# Patient Record
Sex: Male | Born: 1955 | Race: White | Hispanic: No | Marital: Single | Smoking: Never smoker
Health system: Southern US, Community
[De-identification: ages and names within clinical notes are randomized; demographics above are authoritative.]

## PROBLEM LIST (undated history)

## (undated) DIAGNOSIS — M1990 Unspecified osteoarthritis, unspecified site: Secondary | ICD-10-CM

## (undated) DIAGNOSIS — I10 Essential (primary) hypertension: Secondary | ICD-10-CM

## (undated) DIAGNOSIS — E669 Obesity, unspecified: Secondary | ICD-10-CM

## (undated) DIAGNOSIS — E785 Hyperlipidemia, unspecified: Secondary | ICD-10-CM

## (undated) DIAGNOSIS — Z8619 Personal history of other infectious and parasitic diseases: Secondary | ICD-10-CM

## (undated) DIAGNOSIS — F419 Anxiety disorder, unspecified: Secondary | ICD-10-CM

## (undated) DIAGNOSIS — J45909 Unspecified asthma, uncomplicated: Secondary | ICD-10-CM

## (undated) DIAGNOSIS — R739 Hyperglycemia, unspecified: Secondary | ICD-10-CM

## (undated) HISTORY — DX: Hyperglycemia, unspecified: R73.9

## (undated) HISTORY — DX: Hyperlipidemia, unspecified: E78.5

## (undated) HISTORY — DX: Personal history of other infectious and parasitic diseases: Z86.19

## (undated) HISTORY — DX: Unspecified asthma, uncomplicated: J45.909

## (undated) HISTORY — DX: Obesity, unspecified: E66.9

## (undated) HISTORY — DX: Anxiety disorder, unspecified: F41.9

## (undated) HISTORY — DX: Unspecified osteoarthritis, unspecified site: M19.90

## (undated) HISTORY — DX: Essential (primary) hypertension: I10

---
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
## 1998-10-25 DIAGNOSIS — F419 Anxiety disorder, unspecified: Secondary | ICD-10-CM | POA: Insufficient documentation

## 2008-02-13 DIAGNOSIS — I10 Essential (primary) hypertension: Secondary | ICD-10-CM | POA: Insufficient documentation

## 2008-09-05 DIAGNOSIS — E782 Mixed hyperlipidemia: Secondary | ICD-10-CM | POA: Insufficient documentation

## 2013-10-29 ENCOUNTER — Ambulatory Visit: Payer: Self-pay | Admitting: Specialist

## 2014-01-03 ENCOUNTER — Ambulatory Visit: Payer: Self-pay | Admitting: Physician Assistant

## 2014-03-26 DIAGNOSIS — J45909 Unspecified asthma, uncomplicated: Secondary | ICD-10-CM | POA: Insufficient documentation

## 2014-04-25 DIAGNOSIS — L03116 Cellulitis of left lower limb: Secondary | ICD-10-CM | POA: Insufficient documentation

## 2014-04-25 DIAGNOSIS — L03119 Cellulitis of unspecified part of limb: Secondary | ICD-10-CM | POA: Insufficient documentation

## 2014-04-30 DIAGNOSIS — R7303 Prediabetes: Secondary | ICD-10-CM | POA: Insufficient documentation

## 2014-04-30 DIAGNOSIS — R739 Hyperglycemia, unspecified: Secondary | ICD-10-CM | POA: Insufficient documentation

## 2014-04-30 LAB — LIPID PANEL
Cholesterol: 155 mg/dL (ref 0–200)
HDL: 43 mg/dL (ref 35–70)
LDL CALC: 73 mg/dL
TRIGLYCERIDES: 197 mg/dL — AB (ref 40–160)

## 2014-04-30 LAB — BASIC METABOLIC PANEL
BUN: 9 mg/dL (ref 4–21)
Creatinine: 1.1 mg/dL (ref 0.6–1.3)
GLUCOSE: 113 mg/dL
Potassium: 3.4 mmol/L (ref 3.4–5.3)
SODIUM: 140 mmol/L (ref 137–147)

## 2014-04-30 LAB — TSH: TSH: 1.54 u[IU]/mL (ref 0.41–5.90)

## 2014-04-30 LAB — HEPATIC FUNCTION PANEL: ALT: 25 U/L (ref 10–40)

## 2015-02-06 DIAGNOSIS — R600 Localized edema: Secondary | ICD-10-CM | POA: Insufficient documentation

## 2015-02-06 DIAGNOSIS — I872 Venous insufficiency (chronic) (peripheral): Secondary | ICD-10-CM | POA: Insufficient documentation

## 2015-06-06 ENCOUNTER — Other Ambulatory Visit: Payer: Self-pay | Admitting: Family Medicine

## 2015-06-08 NOTE — Telephone Encounter (Signed)
Please call in the following medication.  [id number] [phone]   Surescripts Out Interface  2 days ago   (9:44 AM)       Requested Medications     Name from pharmacy:  In chart as:  ALPRAZOLAM 2 MG TABLET alprazolam (XANAX) 2 MG tablet    Sig: take 1 tablet by mouth four times a day if needed    Dispense: 120 tablet (Pharmacy requested 120)   Refills: 2

## 2015-06-09 NOTE — Telephone Encounter (Signed)
Rx called in to pharmacy. 

## 2015-06-12 ENCOUNTER — Other Ambulatory Visit: Payer: Self-pay | Admitting: Family Medicine

## 2015-08-23 ENCOUNTER — Other Ambulatory Visit: Payer: Self-pay | Admitting: Family Medicine

## 2015-08-23 NOTE — Telephone Encounter (Signed)
Please call in alprazolam.  

## 2015-08-25 NOTE — Telephone Encounter (Signed)
Rx called in to pharmacy. 

## 2015-11-22 ENCOUNTER — Other Ambulatory Visit: Payer: Self-pay | Admitting: Family Medicine

## 2015-11-23 NOTE — Telephone Encounter (Signed)
Please call in alprazolam.  

## 2015-11-24 NOTE — Telephone Encounter (Signed)
Rx called in to pharmacy. 

## 2015-11-24 NOTE — Telephone Encounter (Signed)
Left message with pt's wife that he needs a f/u appt.

## 2015-12-18 ENCOUNTER — Other Ambulatory Visit: Payer: Self-pay | Admitting: Family Medicine

## 2016-02-13 ENCOUNTER — Other Ambulatory Visit: Payer: Self-pay | Admitting: Family Medicine

## 2016-02-13 NOTE — Telephone Encounter (Signed)
Please call in alprazolam.  

## 2016-02-13 NOTE — Telephone Encounter (Signed)
Rx called in to pharmacy. 

## 2016-02-16 DIAGNOSIS — E669 Obesity, unspecified: Secondary | ICD-10-CM | POA: Insufficient documentation

## 2016-02-17 ENCOUNTER — Ambulatory Visit (INDEPENDENT_AMBULATORY_CARE_PROVIDER_SITE_OTHER): Payer: BLUE CROSS/BLUE SHIELD | Admitting: Family Medicine

## 2016-02-17 ENCOUNTER — Encounter: Payer: Self-pay | Admitting: Family Medicine

## 2016-02-17 VITALS — BP 118/70 | HR 103 | Temp 98.7°F | Resp 18 | Ht 68.0 in | Wt 271.0 lb

## 2016-02-17 DIAGNOSIS — E782 Mixed hyperlipidemia: Secondary | ICD-10-CM

## 2016-02-17 DIAGNOSIS — Z23 Encounter for immunization: Secondary | ICD-10-CM | POA: Diagnosis not present

## 2016-02-17 DIAGNOSIS — Z0000 Encounter for general adult medical examination without abnormal findings: Secondary | ICD-10-CM | POA: Diagnosis not present

## 2016-02-17 DIAGNOSIS — Z1159 Encounter for screening for other viral diseases: Secondary | ICD-10-CM | POA: Diagnosis not present

## 2016-02-17 DIAGNOSIS — I10 Essential (primary) hypertension: Secondary | ICD-10-CM | POA: Diagnosis not present

## 2016-02-17 DIAGNOSIS — R739 Hyperglycemia, unspecified: Secondary | ICD-10-CM

## 2016-02-17 NOTE — Patient Instructions (Addendum)
It is recommended to engage in 150 minutes of moderate intensity exercise every week.

## 2016-02-17 NOTE — Progress Notes (Signed)
Patient: Clarence Hamilton, Male    DOB: 24-Apr-1956, 60 y.o.   MRN: [id number] Visit Date: 02/17/2016  Today's Provider: Mila Merry, MD   Chief Complaint  Patient presents with  . Annual Exam  . Hypertension  . Hyperlipidemia  . Asthma  . Anxiety  . Hyperglycemia   Subjective:    Annual physical exam Clarence Hamilton is a 60 y.o. male who presents today for health maintenance and complete physical. He feels well. He reports exercising yes. He reports he is sleeping poorly.  -----------------------------------------------------------------  Follow-up for asthma from 04/29/2014; no changes, followed by Dr. Mayo Ao. Doing well on current inhalers.    Follow-up for anxiety from 04/29/2014; no changes, continue alprazolam and buspirone which he states continues to work very well     Hypertension, follow-up:  BP Readings from Last 3 Encounters:  02/17/16 118/70  04/29/14 140/80    He was last seen for hypertension 04/29/2014.  BP at that visit was 140/80. Management since that visit includes; no changes.He reports good compliance with treatment. He is not having side effects. none  He is exercising. He is adherent to low salt diet.   Outside blood pressures are n/a. He is experiencing none.  Patient denies none.   Cardiovascular risk factors include none.  Use of agents associated with hypertension: none.   ----------------------------------------------------------------------    Lipid/Cholesterol, Follow-up:   Last seen for this 11/29/2014.  Management since that visit includes; changed sincor to simvastatin due to changes in formulary.  Last Lipid Panel:    Component Value Date/Time   CHOL 155 04/30/2014   TRIG 197* 04/30/2014   HDL 43 04/30/2014   LDLCALC 73 04/30/2014    He reports good compliance with treatment. He is not having side effects. none  Wt Readings from Last 3 Encounters:  02/17/16 271 lb (122.925 kg)  04/29/14 234 lb (106.142  kg)    ----------------------------------------------------------------------     Review of Systems  Constitutional: Positive for activity change and unexpected weight change.  HENT: Positive for congestion, postnasal drip, sinus pressure and sneezing.   Eyes: Positive for discharge and itching.  Respiratory: Positive for wheezing.   Cardiovascular: Negative.   Gastrointestinal: Negative.   Endocrine: Negative.   Genitourinary: Negative.   Musculoskeletal: Negative.   Skin: Negative.   Allergic/Immunologic: Positive for environmental allergies.  Neurological: Negative.   Hematological: Negative.   Psychiatric/Behavioral: Positive for sleep disturbance.    Social History      He  reports that he has never smoked. He does not have any smokeless tobacco history on file. He reports that he does not drink alcohol or use illicit drugs.       Social History   Social History  . Marital Status: Single    Spouse Name: N/A  . Number of Children: 0  . Years of Education: N/A   Occupational History  . Retired    Social History Main Topics  . Smoking status: Never Smoker   . Smokeless tobacco: None  . Alcohol Use: No  . Drug Use: No  . Sexual Activity: Not Asked   Other Topics Concern  . None   Social History Narrative    Past Medical History  Diagnosis Date  . History of chicken pox   . Hyperlipidemia   . Anxiety   . Hypertension   . Asthma   . Hyperglycemia   . Obesity      Patient Active Problem List   Diagnosis  Date Noted  . Obesity 02/16/2016  . Dermatitis, stasis 02/06/2015  . Edema leg 02/06/2015  . Hyperglycemia 04/30/2014  . Ankle cellulitis 04/25/2014  . Hyperlipidemia, mixed 09/05/2008  . Hypertension 02/13/2008  . Anxiety disorder 10/25/1998  . Asthma, intrinsic 10/25/1968    Past Surgical History  Procedure Laterality Date  . Hernia repair  1970's    Inguinal hernia repair x 2   . Tonsillectomy and adenoidectomy  1960's    Family  History        Family Status  Relation Status Death Age  . Mother Alive   . Father Alive         His family history includes Breast cancer in his mother; Hypertension in his father and mother; Melanoma in his father; Parkinson's disease in his father.    Allergies  Allergen Reactions  . Sulfa Antibiotics   . Tetracycline   . Corticosteroids     Other reaction(s): Muscle Pain    Previous Medications   ALPRAZOLAM (XANAX) 2 MG TABLET    take 1 tablet by mouth four times a day if needed   AZELASTINE-FLUTICASONE (DYMISTA) 137-50 MCG/ACT SUSP    Place 1 spray into both nostrils 2 (two) times daily.   BUSPIRONE (BUSPAR) 15 MG TABLET    TAKE 1/2 TABLET BY MOUTH FOUR TIMES A DAY   DILTIAZEM (CARDIZEM CD) 240 MG 24 HR CAPSULE    Take 1 capsule by mouth daily.   FLUTICASONE (FLONASE) 50 MCG/ACT NASAL SPRAY    Place 2 sprays into both nostrils every morning.   FUROSEMIDE (LASIX) 20 MG TABLET    Take 1 tablet by mouth daily.   LOSARTAN-HYDROCHLOROTHIAZIDE (HYZAAR) 100-25 MG TABLET    Take 1 tablet by mouth daily.   MONTELUKAST (SINGULAIR) 10 MG TABLET    Take 1 tablet by mouth daily.   MULTIPLE VITAMINS-MINERALS (CENTRUM SILVER PO)    Take 1 tablet by mouth daily.   NIACIN (NIASPAN) 1000 MG CR TABLET    take 1 tablet by mouth once daily   PROAIR HFA 108 (90 BASE) MCG/ACT INHALER       SIMVASTATIN (ZOCOR) 20 MG TABLET    take 1 tablet by mouth every evening   TRIAMCINOLONE OINTMENT (KENALOG) 0.5 %    Apply 1 application topically 2 (two) times daily.    Patient Care Team: Malva Limes, MD as PCP - General (Family Medicine) Mertie Moores, MD as Referring Physician (Specialist)     Objective:   Vitals: BP 118/70 mmHg  Pulse 103  Temp(Src) 98.7 F (37.1 C) (Oral)  Resp 18  Ht  (1.727 m)  Wt 271 lb (122.925 kg)  BMI 41.22 kg/m2  SpO2 94%   Physical Exam   General Appearance:    Alert, cooperative, no distress, appears stated age, morbidly ovese  Head:    Normocephalic,  without obvious abnormality, atraumatic  Eyes:    PERRL, conjunctiva/corneas clear, EOM's intact, fundi    benign, both eyes       Ears:    Normal TM's and external ear canals, both ears  Nose:   Nares normal, septum midline, mucosa normal, no drainage   or sinus tenderness  Throat:   Lips, mucosa, and tongue normal; teeth and gums normal  Neck:   Supple, symmetrical, trachea midline, no adenopathy;       thyroid:  No enlargement/tenderness/nodules; no carotid   bruit or JVD  Back:     Symmetric, no curvature, ROM normal, no CVA  tenderness  Lungs:     Clear to auscultation bilaterally, respirations unlabored  Chest wall:    No tenderness or deformity  Heart:    Regular rate and rhythm, S1 and S2 normal, no murmur, rub   or gallop  Abdomen:     Soft, non-tender, bowel sounds active all four quadrants,    no masses, no organomegaly  Genitalia:    deferred  Rectal:    deferred  Extremities:   Extremities normal, atraumatic, no cyanosis or edema  Pulses:   2+ and symmetric all extremities  Skin:   Skin color, texture, turgor normal, no rashes or lesions  Lymph nodes:   Cervical, supraclavicular, and axillary nodes normal  Neurologic:   CNII-XII intact. Normal strength, sensation and reflexes      throughout    Depression Screen PHQ 2/9 Scores 02/17/2016  PHQ - 2 Score 0  PHQ- 9 Score 3      Assessment & Plan:     Routine Health Maintenance and Physical Exam  Exercise Activities and Dietary recommendations Goals    None       There is no immunization history on file for this patient.  Health Maintenance  Topic Date Due  . Hepatitis C Screening  1956-01-02  . HIV Screening  07/01/1972  . TETANUS/TDAP  07/01/1976  . COLONOSCOPY  07/02/2007  . INFLUENZA VACCINE  05/25/2016      Discussed health benefits of physical activity, and encouraged him to engage in regular exercise appropriate for his age and condition.      --------------------------------------------------------------------   1. Annual physical exam  - Comprehensive metabolic panel - CBC - PSA  2. Hyperglycemia  - Hemoglobin A1c  3. Hyperlipidemia, mixed He is tolerating simvastatin well with no adverse effects.   - Lipid panel  4. Essential hypertension .Well controlled.  Continue current medications.   - TSH - EKG 12-Lead  5. Need for hepatitis C screening test  - Hepatitis C antibody  6. Morbid obesity, unspecified obesity type (HCC) He is planning on staring exercising more frequently and reducing calory intake.

## 2016-02-27 ENCOUNTER — Other Ambulatory Visit: Payer: Self-pay | Admitting: Family Medicine

## 2016-03-31 ENCOUNTER — Other Ambulatory Visit: Payer: Self-pay | Admitting: *Deleted

## 2016-03-31 DIAGNOSIS — Z1212 Encounter for screening for malignant neoplasm of rectum: Principal | ICD-10-CM

## 2016-03-31 DIAGNOSIS — Z1211 Encounter for screening for malignant neoplasm of colon: Secondary | ICD-10-CM

## 2016-03-31 LAB — IFOBT (OCCULT BLOOD): IFOBT: NEGATIVE

## 2016-04-01 ENCOUNTER — Encounter: Payer: Self-pay | Admitting: Family Medicine

## 2016-04-01 DIAGNOSIS — R972 Elevated prostate specific antigen [PSA]: Secondary | ICD-10-CM | POA: Insufficient documentation

## 2016-04-01 LAB — CBC
HEMOGLOBIN: 14.9 g/dL (ref 12.6–17.7)
Hematocrit: 44.5 % (ref 37.5–51.0)
MCH: 29.7 pg (ref 26.6–33.0)
MCHC: 33.5 g/dL (ref 31.5–35.7)
MCV: 89 fL (ref 79–97)
Platelets: 278 10*3/uL (ref 150–379)
RBC: 5.02 x10E6/uL (ref 4.14–5.80)
RDW: 14.7 % (ref 12.3–15.4)
WBC: 9.9 10*3/uL (ref 3.4–10.8)

## 2016-04-01 LAB — LIPID PANEL
Chol/HDL Ratio: 3.8 ratio units (ref 0.0–5.0)
Cholesterol, Total: 170 mg/dL (ref 100–199)
HDL: 45 mg/dL (ref >39)
LDL Calculated: 80 mg/dL (ref 0–99)
TRIGLYCERIDES: 224 mg/dL — AB (ref 0–149)
VLDL Cholesterol Cal: 45 mg/dL — ABNORMAL HIGH (ref 5–40)

## 2016-04-01 LAB — PSA: Prostate Specific Ag, Serum: 4.4 ng/mL — ABNORMAL HIGH (ref 0.0–4.0)

## 2016-04-01 LAB — COMPREHENSIVE METABOLIC PANEL
A/G RATIO: 1.6 (ref 1.2–2.2)
ALT: 31 IU/L (ref 0–44)
AST: 17 IU/L (ref 0–40)
Albumin: 4.3 g/dL (ref 3.5–5.5)
Alkaline Phosphatase: 107 IU/L (ref 39–117)
BUN/Creatinine Ratio: 13 (ref 9–20)
BUN: 13 mg/dL (ref 6–24)
Bilirubin Total: 0.4 mg/dL (ref 0.0–1.2)
CO2: 31 mmol/L — ABNORMAL HIGH (ref 18–29)
Calcium: 9.5 mg/dL (ref 8.7–10.2)
Chloride: 96 mmol/L (ref 96–106)
Creatinine, Ser: 1.04 mg/dL (ref 0.76–1.27)
GFR, EST AFRICAN AMERICAN: 91 mL/min/{1.73_m2} (ref >59)
GFR, EST NON AFRICAN AMERICAN: 79 mL/min/{1.73_m2} (ref >59)
GLUCOSE: 112 mg/dL — AB (ref 65–99)
Globulin, Total: 2.7 g/dL (ref 1.5–4.5)
POTASSIUM: 3.9 mmol/L (ref 3.5–5.2)
SODIUM: 142 mmol/L (ref 134–144)
Total Protein: 7 g/dL (ref 6.0–8.5)

## 2016-04-01 LAB — HEMOGLOBIN A1C
Est. average glucose Bld gHb Est-mCnc: 143 mg/dL
Hgb A1c MFr Bld: 6.6 % — ABNORMAL HIGH (ref 4.8–5.6)

## 2016-04-01 LAB — TSH: TSH: 1.49 u[IU]/mL (ref 0.450–4.500)

## 2016-04-01 LAB — HEPATITIS C ANTIBODY: Hep C Virus Ab: <0.1 s/co ratio (ref 0.0–0.9)

## 2016-04-09 ENCOUNTER — Other Ambulatory Visit: Payer: Self-pay | Admitting: Family Medicine

## 2016-05-07 ENCOUNTER — Other Ambulatory Visit: Payer: Self-pay | Admitting: Family Medicine

## 2016-05-07 DIAGNOSIS — F419 Anxiety disorder, unspecified: Secondary | ICD-10-CM

## 2016-05-07 NOTE — Telephone Encounter (Signed)
Please call in alprazolam.  

## 2016-07-11 ENCOUNTER — Telehealth: Payer: Self-pay | Admitting: Family Medicine

## 2016-07-11 DIAGNOSIS — R972 Elevated prostate specific antigen [PSA]: Secondary | ICD-10-CM

## 2016-07-11 DIAGNOSIS — R739 Hyperglycemia, unspecified: Secondary | ICD-10-CM

## 2016-07-11 NOTE — Telephone Encounter (Signed)
-----   Message from Malva Limesonald E , MD sent at 07/09/2016  8:05 AM EDT ----- Regarding: FW: be sure returns september for follow up labs and elevated PSA   ----- Message ----- From: Malva Limesonald E , MD Sent: 07/02/2016 To: Malva Limesonald E , MD Subject: be sure returns september for follow up labs#

## 2016-07-11 NOTE — Telephone Encounter (Signed)
Patient is due to check PSA due to elevated PSA in June and HgbA1c due to elevated blood sugar in June. Please advised patient and leave order for him to print at front desk. Thanks.

## 2016-07-12 NOTE — Telephone Encounter (Signed)
Patient notified and lab slip at front desk for pick-up.

## 2016-07-24 ENCOUNTER — Other Ambulatory Visit: Payer: Self-pay | Admitting: Family Medicine

## 2016-08-04 ENCOUNTER — Telehealth: Payer: Self-pay | Admitting: Family Medicine

## 2016-08-04 NOTE — Telephone Encounter (Signed)
-----   Message from Malva Limesonald E , MD sent at 07/09/2016  8:05 AM EDT ----- Regarding: FW: be sure returns september for follow up labs and elevated PSA   ----- Message ----- From: Malva Limesonald E , MD Sent: 07/02/2016 To: Malva Limesonald E , MD Subject: be sure returns september for follow up labs#

## 2016-08-04 NOTE — Telephone Encounter (Signed)
Please advise patient it is time to recheck PSA and A1C due to elevated levels when last checked in June. Please print lab order from 07-12-2016 for him to pick up. Thanks.

## 2016-08-26 NOTE — Telephone Encounter (Signed)
[id number] 

## 2016-08-27 NOTE — Telephone Encounter (Signed)
LMOVM for pt to return call 

## 2016-08-28 ENCOUNTER — Other Ambulatory Visit: Payer: Self-pay | Admitting: Family Medicine

## 2016-08-28 DIAGNOSIS — F419 Anxiety disorder, unspecified: Secondary | ICD-10-CM

## 2016-08-29 NOTE — Telephone Encounter (Signed)
Please call in alprazolam.  

## 2016-08-30 NOTE — Telephone Encounter (Signed)
Patient called back and stated that he will come get his labs drawn sometime this week.

## 2016-08-30 NOTE — Telephone Encounter (Signed)
Rx called in to pharmacy. 

## 2016-08-31 LAB — HEMOGLOBIN A1C
ESTIMATED AVERAGE GLUCOSE: 131 mg/dL
Hgb A1c MFr Bld: 6.2 % — ABNORMAL HIGH (ref 4.8–5.6)

## 2016-08-31 LAB — PSA: Prostate Specific Ag, Serum: 6.5 ng/mL — ABNORMAL HIGH (ref 0.0–4.0)

## 2016-09-02 ENCOUNTER — Telehealth: Payer: Self-pay

## 2016-09-02 DIAGNOSIS — R972 Elevated prostate specific antigen [PSA]: Secondary | ICD-10-CM

## 2016-09-02 NOTE — Telephone Encounter (Signed)
-----   Message from Malva Limesonald E Fisher, MD sent at 09/02/2016  8:26 AM EST ----- A1c is borderline for diabetes at 6.2 need to check every six months. PSA has gone up more to 6.5. Need referral to urology for further evaluation.

## 2016-09-02 NOTE — Telephone Encounter (Signed)
Pt advised.  [facility] refer to [facility].  Thanks,   -Vernona RiegerLaura

## 2016-09-13 ENCOUNTER — Ambulatory Visit: Payer: BLUE CROSS/BLUE SHIELD

## 2016-10-11 ENCOUNTER — Telehealth: Payer: Self-pay | Admitting: Family Medicine

## 2016-10-11 NOTE — Telephone Encounter (Signed)
Pt states that the only doctor in town that is in network with insurance is Dr Artis FlockWolfe.Appointment made for 10/20/16 at 2:45 (Pt requested to wait until end of Dec for appointment).

## 2016-10-11 NOTE — Telephone Encounter (Signed)
-----   Message from Malva Limesonald E , MD sent at 09/27/2016  8:13 AM EST ----- Regarding: FW: malke sures sees urology 11-20 for high psa Patient cancelled make sure reschedules before end of year.   ----- Message ----- From: Malva Limesonald E , MD Sent: 09/25/2016 To: Malva Limesonald E , MD Subject: Annell GreeningFWDartha Lodge: malke sures sees urology 11-20 for high #    ----- Message ----- From: Malva Limesonald E , MD Sent: 09/14/2016 To: Malva Limesonald E , MD Subject: Lenna Sciaramalke sures sees urology 11-20 for high psa

## 2016-11-09 NOTE — Telephone Encounter (Signed)
Patient stated that he has not seen the urologist yet. Patient stated that he has been seeing Dr. Mayo AoFlemming for breathing problems and he hasn't wanted to deal with seeing another doctor at this time. Patient stated that he will make an appt to se Dr. Artis FlockWolfe soon.

## 2016-11-09 NOTE — Telephone Encounter (Signed)
Please check with patient to see if every saw urologist about high PSA. We never received any records from urology.

## 2016-12-15 ENCOUNTER — Other Ambulatory Visit: Payer: Self-pay | Admitting: Family Medicine

## 2017-01-15 ENCOUNTER — Other Ambulatory Visit: Payer: Self-pay | Admitting: Family Medicine

## 2017-01-15 DIAGNOSIS — F419 Anxiety disorder, unspecified: Secondary | ICD-10-CM

## 2017-01-15 NOTE — Telephone Encounter (Signed)
Please call in alprazolam.  

## 2017-01-17 NOTE — Telephone Encounter (Signed)
Rx called in to pharmacy. 

## 2017-03-12 ENCOUNTER — Other Ambulatory Visit: Payer: Self-pay | Admitting: Family Medicine

## 2017-03-30 ENCOUNTER — Other Ambulatory Visit: Payer: Self-pay | Admitting: Specialist

## 2017-03-30 DIAGNOSIS — J849 Interstitial pulmonary disease, unspecified: Secondary | ICD-10-CM

## 2017-03-30 DIAGNOSIS — R0902 Hypoxemia: Secondary | ICD-10-CM

## 2017-03-30 DIAGNOSIS — R0602 Shortness of breath: Secondary | ICD-10-CM

## 2017-04-04 ENCOUNTER — Ambulatory Visit
Admission: RE | Admit: 2017-04-04 | Discharge: 2017-04-04 | Disposition: A | Discharge status: Home / Self Care | Payer: BLUE CROSS/BLUE SHIELD | Source: Ambulatory Visit | Attending: Specialist | Admitting: Specialist

## 2017-04-04 DIAGNOSIS — I2510 Atherosclerotic heart disease of native coronary artery without angina pectoris: Secondary | ICD-10-CM | POA: Diagnosis not present

## 2017-04-04 DIAGNOSIS — J398 Other specified diseases of upper respiratory tract: Secondary | ICD-10-CM | POA: Insufficient documentation

## 2017-04-04 DIAGNOSIS — R0602 Shortness of breath: Secondary | ICD-10-CM | POA: Diagnosis not present

## 2017-04-04 DIAGNOSIS — I700 Atherosclerosis of aorta: Secondary | ICD-10-CM | POA: Insufficient documentation

## 2017-04-04 DIAGNOSIS — J984 Other disorders of lung: Secondary | ICD-10-CM | POA: Diagnosis not present

## 2017-04-04 DIAGNOSIS — R918 Other nonspecific abnormal finding of lung field: Secondary | ICD-10-CM | POA: Insufficient documentation

## 2017-04-04 DIAGNOSIS — R0902 Hypoxemia: Secondary | ICD-10-CM | POA: Diagnosis not present

## 2017-04-04 DIAGNOSIS — J849 Interstitial pulmonary disease, unspecified: Secondary | ICD-10-CM

## 2017-04-05 ENCOUNTER — Other Ambulatory Visit: Payer: Self-pay | Admitting: Specialist

## 2017-04-05 DIAGNOSIS — R0602 Shortness of breath: Secondary | ICD-10-CM

## 2017-04-08 ENCOUNTER — Other Ambulatory Visit: Payer: Self-pay | Admitting: Family Medicine

## 2017-04-08 DIAGNOSIS — F419 Anxiety disorder, unspecified: Secondary | ICD-10-CM

## 2017-05-06 ENCOUNTER — Other Ambulatory Visit: Payer: Self-pay | Admitting: Family Medicine

## 2017-06-06 ENCOUNTER — Other Ambulatory Visit: Payer: Self-pay | Admitting: Family Medicine

## 2017-06-06 DIAGNOSIS — F419 Anxiety disorder, unspecified: Secondary | ICD-10-CM

## 2017-06-06 NOTE — Telephone Encounter (Signed)
Please call in alprazolam.  Patient needs to schedule office visit.

## 2017-06-06 NOTE — Telephone Encounter (Signed)
Please review. Thanks!  

## 2017-06-07 NOTE — Telephone Encounter (Signed)
Rx called in to pharmacy. 

## 2017-06-10 ENCOUNTER — Other Ambulatory Visit: Payer: Self-pay | Admitting: Family Medicine

## 2017-07-04 ENCOUNTER — Other Ambulatory Visit: Payer: Self-pay | Admitting: Family Medicine

## 2017-07-04 DIAGNOSIS — F419 Anxiety disorder, unspecified: Secondary | ICD-10-CM

## 2017-07-31 ENCOUNTER — Other Ambulatory Visit: Payer: Self-pay | Admitting: Family Medicine

## 2017-07-31 DIAGNOSIS — F419 Anxiety disorder, unspecified: Secondary | ICD-10-CM

## 2017-08-01 NOTE — Telephone Encounter (Signed)
Rx called in to pharmacy. 

## 2017-08-01 NOTE — Telephone Encounter (Signed)
Please call in alprazolam.  

## 2017-09-13 ENCOUNTER — Telehealth: Payer: Self-pay | Admitting: Family Medicine

## 2017-09-13 NOTE — Telephone Encounter (Signed)
Pt wanted to let Dr. Sherrie MustacheFisher know that Dr. Meredeth IdeFleming has already started the process for pt to get disability due to COPD, shortness of breath, & anxiety. Thanks TNP

## 2017-09-26 ENCOUNTER — Other Ambulatory Visit: Payer: Self-pay | Admitting: Family Medicine

## 2017-09-26 NOTE — Telephone Encounter (Signed)
Patient has not been in nearly 2 years, need to schedule apt before refill can be approved.

## 2017-09-27 NOTE — Telephone Encounter (Signed)
OV has been scheduled for 10/04/2017.

## 2017-10-04 ENCOUNTER — Ambulatory Visit: Payer: Self-pay | Admitting: Family Medicine

## 2017-10-07 ENCOUNTER — Ambulatory Visit: Payer: BLUE CROSS/BLUE SHIELD | Admitting: Family Medicine

## 2017-10-07 ENCOUNTER — Encounter: Payer: Self-pay | Admitting: Family Medicine

## 2017-10-07 VITALS — BP 110/68 | HR 111 | Temp 99.0°F | Resp 18 | Ht 68.0 in | Wt 254.0 lb

## 2017-10-07 DIAGNOSIS — M545 Low back pain: Secondary | ICD-10-CM

## 2017-10-07 DIAGNOSIS — E782 Mixed hyperlipidemia: Secondary | ICD-10-CM | POA: Diagnosis not present

## 2017-10-07 DIAGNOSIS — I10 Essential (primary) hypertension: Secondary | ICD-10-CM

## 2017-10-07 DIAGNOSIS — R739 Hyperglycemia, unspecified: Secondary | ICD-10-CM

## 2017-10-07 DIAGNOSIS — F419 Anxiety disorder, unspecified: Secondary | ICD-10-CM | POA: Diagnosis not present

## 2017-10-07 DIAGNOSIS — J40 Bronchitis, not specified as acute or chronic: Secondary | ICD-10-CM | POA: Diagnosis not present

## 2017-10-07 DIAGNOSIS — G8929 Other chronic pain: Secondary | ICD-10-CM | POA: Diagnosis not present

## 2017-10-07 LAB — POCT GLYCOSYLATED HEMOGLOBIN (HGB A1C)
ESTIMATED AVERAGE GLUCOSE: 128
Hemoglobin A1C: 6.1

## 2017-10-07 MED ORDER — AZITHROMYCIN 250 MG PO TABS: ORAL_TABLET | ORAL | 0 refills | Status: AC

## 2017-10-07 NOTE — Patient Instructions (Signed)
Please go to the [facility] lab draw center in [address] on the second floor of [facility]  Go to the [facility] on [address] for back Xray  

## 2017-10-07 NOTE — Progress Notes (Signed)
Patient: Clarence Hamilton Male    DOB: 08/13/1956   61 y.o.   MRN: [id number] Visit Date: 10/07/2017  Today's Provider: Mila Merryonald Fisher, MD   No chief complaint on file.  Subjective:    HPI   Hypertension, follow-up:  BP Readings from Last 3 Encounters:  02/17/16 118/70  04/29/14 140/80    He was last seen for hypertension 02/17/2016.  BP at that visit was 118/70. Management since that visit includes; no changes.He reports good compliance with treatment. He is not having side effects. none He is not exercising. He is adherent to low salt diet.   Outside blood pressures are n/a. He is experiencing none.  Patient denies none.   Cardiovascular risk factors include advanced age (older than 7555 for men, 3565 for women).   ------------------------------------------------------------------------    Lipid/Cholesterol, Follow-up:   Last seen for this 02/17/2016.  Management since that visit includes; labs checked, no changes.  Last Lipid Panel:    Component Value Date/Time   CHOL 170 03/31/2016 1020   TRIG 224 (H) 03/31/2016 1020   HDL 45 03/31/2016 1020   CHOLHDL 3.8 03/31/2016 1020   LDLCALC 80 03/31/2016 1020    He reports good compliance with treatment. He is not having side effects. none  Wt Readings from Last 3 Encounters:  02/17/16 271 lb (122.9 kg)  04/29/14 234 lb (106.1 kg)    ----------------------------------------------------------------  Hyperglycemia From 08/30/2016-labs checked, advised to recheck every 6 months. Hemoglobin A1c 6.2. This SmartLink has not been configured with any valid records.     Morbid obesity, unspecified obesity type (HCC) .He has not been able to exercise regularly due to COPD and spending all of his time caring for his ailing mother. Is trying to eat healthy when able.   Anxiety disorder  From 1 year ago. No changes were made. He feels that current regiment of buspirone and alprazolam is working well.   He also  complains of nasal and sinus congestion and cough productive brown sputum  Worsening over the last week. H  He also complains of persistent pain on mid lower back bilaterally for several months. No alleviated with OTc medication, does not radiate into legs.   Allergies  Allergen Reactions  . Sulfa Antibiotics   . Tetracycline   . Corticosteroids     Other reaction(s): Muscle Pain     Current Outpatient Medications:  .  alprazolam (XANAX) 2 MG tablet, take 1 tablet by mouth four times a day, Disp: 120 tablet, Rfl: 3 .  Azelastine-Fluticasone (DYMISTA) 137-50 MCG/ACT SUSP, Place 1 spray into both nostrils 2 (two) times daily., Disp: , Rfl:  .  busPIRone (BUSPAR) 15 MG tablet, take 1/2 tablet by mouth four times a day, Disp: 180 tablet, Rfl: 2 .  diltiazem (CARDIZEM CD) 240 MG 24 hr capsule, Take 1 capsule by mouth daily., Disp: , Rfl:  .  fluticasone (FLONASE) 50 MCG/ACT nasal spray, Place 2 sprays into both nostrils every morning., Disp: , Rfl:  .  furosemide (LASIX) 20 MG tablet, Take 1 tablet by mouth daily., Disp: , Rfl:  .  losartan-hydrochlorothiazide (HYZAAR) 100-25 MG tablet, Take 1 tablet by mouth daily., Disp: , Rfl:  .  montelukast (SINGULAIR) 10 MG tablet, Take 1 tablet by mouth daily., Disp: , Rfl:  .  Multiple Vitamins-Minerals (CENTRUM SILVER PO), Take 1 tablet by mouth daily., Disp: , Rfl:  .  niacin (NIASPAN) 1000 MG CR tablet, take 1 tablet by mouth once  daily, Disp: 30 tablet, Rfl: 11 .  PROAIR HFA 108 (90 Base) MCG/ACT inhaler, , Disp: , Rfl: 10 .  simvastatin (ZOCOR) 20 MG tablet, take 1 tablet by mouth at bedtime, Disp: 30 tablet, Rfl: 11 .  triamcinolone ointment (KENALOG) 0.5 %, Apply 1 application topically 2 (two) times daily., Disp: , Rfl:  .  venlafaxine XR (EFFEXOR-XR) 75 MG 24 hr capsule, take 3 capsules by mouth every morning, Disp: 90 capsule, Rfl: 0  Review of Systems  Constitutional: Negative for appetite change, chills and fever.  Respiratory: Negative  for chest tightness, shortness of breath and wheezing.   Cardiovascular: Negative for chest pain and palpitations.  Gastrointestinal: Negative for abdominal pain, nausea and vomiting.    Social History   Tobacco Use  . Smoking status: Never Smoker  Substance Use Topics  . Alcohol use: No    Alcohol/week: 0.0 oz   Objective:    Vitals:   10/07/17 1459  BP: 110/68  Pulse: (!) 111  Resp: 18  Temp: 99 F (37.2 C)  TempSrc: Oral  SpO2: 94%  Weight: 254 lb (115.2 kg)  Height: 5\' 8"  (1.727 m)     Physical Exam  General Appearance:    Alert, cooperative, no distress  HENT:   bilateral TM normal without fluid or infection, neck without nodes, pharynx erythematous without e2xudate, maxillary sinus tender and nasal mucosa pale and congested  Eyes:    PERRL, conjunctiva/corneas clear, EOM's intact       Lungs:     Occasional expiratory wheeze, no rales,, respirations unlabored  Heart:    Regular rate and rhythm  Neurologic:   Awake, alert, oriented x 3. No apparent focal neurological           defect.           Assessment & Plan:     1. Bronchitis  - azithromycin (ZITHROMAX) 250 MG tablet; 2 by mouth today, then 1 daily for 4 days  Dispense: 6 tablet; Refill: 0   2. Hyperglycemia  - POCT glycosylated hemoglobin (Hb A1C) - Hemoglobin A1c  3. Essential hypertension Well controlled.  Continue current medications.    4. Anxiety disorder, unspecified type Well controlled on current medication regiment.   5. Hyperlipidemia, mixed He is tolerating simvastatin well with no adverse effects.   - Lipid panel - COMPLETE METABOLIC PANEL WITH GFR  6. Chronic bilateral low back pain, with sciatica presence unspecified - DG Lumbar Spine Complete; Future   Addressed extensive list of chronic and acute medical problems today requiring extensive time in counseling and coordination of care.  Over half of this 45 minute visit were spent in counseling and coordinating care of  multiple medical problems.       Mila Merryonald Fisher, MD  [facility]

## 2017-10-13 ENCOUNTER — Ambulatory Visit
Admission: RE | Admit: 2017-10-13 | Discharge: 2017-10-13 | Disposition: A | Discharge status: Home / Self Care | Payer: BLUE CROSS/BLUE SHIELD | Source: Ambulatory Visit | Attending: Family Medicine | Admitting: Family Medicine

## 2017-10-13 DIAGNOSIS — G8929 Other chronic pain: Secondary | ICD-10-CM | POA: Insufficient documentation

## 2017-10-13 DIAGNOSIS — M545 Low back pain: Secondary | ICD-10-CM | POA: Insufficient documentation

## 2017-10-13 DIAGNOSIS — M47816 Spondylosis without myelopathy or radiculopathy, lumbar region: Secondary | ICD-10-CM | POA: Insufficient documentation

## 2017-10-13 DIAGNOSIS — M4316 Spondylolisthesis, lumbar region: Secondary | ICD-10-CM | POA: Diagnosis not present

## 2017-10-13 DIAGNOSIS — M5136 Other intervertebral disc degeneration, lumbar region: Secondary | ICD-10-CM | POA: Diagnosis not present

## 2017-10-13 DIAGNOSIS — I700 Atherosclerosis of aorta: Secondary | ICD-10-CM | POA: Insufficient documentation

## 2017-10-14 ENCOUNTER — Encounter: Payer: Self-pay | Admitting: Family Medicine

## 2017-10-14 ENCOUNTER — Telehealth: Payer: Self-pay

## 2017-10-14 ENCOUNTER — Telehealth: Payer: Self-pay | Admitting: Family Medicine

## 2017-10-14 DIAGNOSIS — I700 Atherosclerosis of aorta: Secondary | ICD-10-CM | POA: Insufficient documentation

## 2017-10-14 LAB — COMPLETE METABOLIC PANEL WITH GFR
AG RATIO: 1.6 (calc) (ref 1.0–2.5)
ALKALINE PHOSPHATASE (APISO): 85 U/L (ref 40–115)
ALT: 25 U/L (ref 9–46)
AST: 20 U/L (ref 10–35)
Albumin: 4.1 g/dL (ref 3.6–5.1)
BILIRUBIN TOTAL: 0.4 mg/dL (ref 0.2–1.2)
BUN: 12 mg/dL (ref 7–25)
CHLORIDE: 97 mmol/L — AB (ref 98–110)
CO2: 37 mmol/L — ABNORMAL HIGH (ref 20–32)
Calcium: 9.4 mg/dL (ref 8.6–10.3)
Creat: 1.05 mg/dL (ref 0.70–1.25)
GFR, Est African American: 89 mL/min/{1.73_m2} (ref >=60)
GFR, Est Non African American: 77 mL/min/{1.73_m2} (ref >=60)
GLUCOSE: 89 mg/dL (ref 65–99)
Globulin: 2.5 g/dL (calc) (ref 1.9–3.7)
Potassium: 3.2 mmol/L — ABNORMAL LOW (ref 3.5–5.3)
Sodium: 142 mmol/L (ref 135–146)
TOTAL PROTEIN: 6.6 g/dL (ref 6.1–8.1)

## 2017-10-14 LAB — LIPID PANEL
CHOL/HDL RATIO: 3.1 (calc) (ref <5.0)
Cholesterol: 160 mg/dL (ref <200)
HDL: 52 mg/dL (ref >40)
LDL CHOLESTEROL (CALC): 83 mg/dL
NON-HDL CHOLESTEROL (CALC): 108 mg/dL (ref <130)
TRIGLYCERIDES: 153 mg/dL — AB (ref <150)

## 2017-10-14 LAB — HEMOGLOBIN A1C
EAG (MMOL/L): 7.9 (calc)
Hgb A1c MFr Bld: 6.6 % of total Hgb — ABNORMAL HIGH (ref <5.7)
MEAN PLASMA GLUCOSE: 143 (calc)

## 2017-10-14 MED ORDER — POTASSIUM CHLORIDE CRYS ER 20 MEQ PO TBCR: 20.0000 meq | EXTENDED_RELEASE_TABLET | Freq: Every day | ORAL | 3 refills | Status: DC

## 2017-10-14 MED ORDER — TRAMADOL HCL 50 MG PO TABS: 50.0000 mg | ORAL_TABLET | Freq: Three times a day (TID) | ORAL | 1 refills | Status: DC | PRN

## 2017-10-14 NOTE — Telephone Encounter (Signed)
Pt returned Michelle's call to get Xray results. Please advise. Thanks TNP

## 2017-10-14 NOTE — Telephone Encounter (Signed)
Patient advised of xray results. Patient states he will think about physical therapy. Patient wanted to ask if he can get some type of medication to help with the pain. He states pain is severe and he does have to care for his mother 24/7 and it makes it difficult and frustrating. Please Jonelle Sportsadvise- V , RMA

## 2017-10-14 NOTE — Telephone Encounter (Signed)
-----   Message from Malva Limesonald E Fisher, MD sent at 10/14/2017  8:10 AM EST ----- Xray shows significant degenerative disk disk (a form of arthritis in back). Losing weight will help. Also recommend referral to physical therapy to help with chronic back pain.

## 2017-10-24 ENCOUNTER — Other Ambulatory Visit: Payer: Self-pay | Admitting: Family Medicine

## 2017-11-26 ENCOUNTER — Other Ambulatory Visit: Payer: Self-pay | Admitting: Family Medicine

## 2017-11-26 DIAGNOSIS — F419 Anxiety disorder, unspecified: Secondary | ICD-10-CM

## 2017-11-29 ENCOUNTER — Telehealth: Payer: Self-pay | Admitting: Family Medicine

## 2017-11-29 NOTE — Telephone Encounter (Signed)
Patient called stating he cannot tolerate Tramadol.  It kept him wide awake for 2 nights.   Please make a note of this in his chart so he wont be presribed this.

## 2017-12-02 ENCOUNTER — Encounter: Payer: Self-pay | Admitting: Family Medicine

## 2017-12-02 ENCOUNTER — Ambulatory Visit: Payer: BLUE CROSS/BLUE SHIELD | Admitting: Family Medicine

## 2017-12-02 VITALS — BP 144/80 | HR 78 | Resp 24 | Wt 277.0 lb

## 2017-12-02 DIAGNOSIS — M545 Low back pain: Secondary | ICD-10-CM | POA: Diagnosis not present

## 2017-12-02 DIAGNOSIS — G8929 Other chronic pain: Secondary | ICD-10-CM | POA: Diagnosis not present

## 2017-12-02 MED ORDER — CYCLOBENZAPRINE HCL 5 MG PO TABS: 5.0000 mg | ORAL_TABLET | Freq: Three times a day (TID) | ORAL | 1 refills | Status: DC | PRN

## 2017-12-02 MED ORDER — MELOXICAM 15 MG PO TABS: 15.0000 mg | ORAL_TABLET | Freq: Every day | ORAL | 2 refills | Status: DC

## 2017-12-02 NOTE — Progress Notes (Signed)
Patient: Clarence Hamilton Male    DOB: 05-21-1956   62 y.o.   MRN: [id number] Visit Date: 12/02/2017  Today's Provider: Mila Merry, MD   Chief Complaint  Patient presents with  . Back Pain    chronic    Subjective:    HPI Patient was last seen in the office on 10/07/17. Xray of lumbar spine showed mild lumbar disc degeneration an lumbar facet arthrosis. Weight loss and a referral for physical therapy was recommended. He was also prescribed tramadol for , but on 11/29/17 the patient called and said that it caused insomnia and has stopped taking. He is another pain medication today. Pain is unchanged from 10/07/2017 visit.      Allergies  Allergen Reactions  . Sulfa Antibiotics   . Tetracycline   . Tramadol     insomnia  . Corticosteroids     Other reaction(s): Muscle Pain     Current Outpatient Medications:  .  alprazolam (XANAX) 2 MG tablet, TAKE 1 TABLET BY MOUTH FOUR TIMES DAILY, Disp: 120 tablet, Rfl: 2 .  Azelastine-Fluticasone (DYMISTA) 137-50 MCG/ACT SUSP, Place 1 spray into both nostrils 2 (two) times daily., Disp: , Rfl:  .  busPIRone (BUSPAR) 15 MG tablet, take 1/2 tablet by mouth four times a day, Disp: 180 tablet, Rfl: 2 .  diltiazem (CARDIZEM CD) 240 MG 24 hr capsule, Take 1 capsule by mouth daily., Disp: , Rfl:  .  fluticasone (FLONASE) 50 MCG/ACT nasal spray, Place 2 sprays into both nostrils every morning., Disp: , Rfl:  .  furosemide (LASIX) 20 MG tablet, Take 1 tablet by mouth daily., Disp: , Rfl:  .  losartan-hydrochlorothiazide (HYZAAR) 100-25 MG tablet, Take 1 tablet by mouth daily., Disp: , Rfl:  .  montelukast (SINGULAIR) 10 MG tablet, Take 1 tablet by mouth daily., Disp: , Rfl:  .  Multiple Vitamins-Minerals (CENTRUM SILVER PO), Take 1 tablet by mouth daily., Disp: , Rfl:  .  niacin (NIASPAN) 1000 MG CR tablet, take 1 tablet by mouth once daily, Disp: 30 tablet, Rfl: 11 .  potassium chloride SA (K-DUR,KLOR-CON) 20 MEQ tablet, Take 1 tablet (20  mEq total) by mouth daily., Disp: 30 tablet, Rfl: 3 .  PROAIR HFA 108 (90 Base) MCG/ACT inhaler, , Disp: , Rfl: 10 .  simvastatin (ZOCOR) 20 MG tablet, take 1 tablet by mouth at bedtime, Disp: 30 tablet, Rfl: 11 .  traMADol (ULTRAM) 50 MG tablet, Take 1 tablet (50 mg total) by mouth every 8 (eight) hours as needed for moderate pain., Disp: 30 tablet, Rfl: 1 .  triamcinolone ointment (KENALOG) 0.5 %, Apply 1 application topically 2 (two) times daily., Disp: , Rfl:  .  venlafaxine XR (EFFEXOR-XR) 75 MG 24 hr capsule, take 3 capsules by mouth every morning, Disp: 90 capsule, Rfl: 4  Review of Systems  Musculoskeletal: Positive for back pain. Negative for gait problem.  Neurological: Negative for weakness.    Social History   Tobacco Use  . Smoking status: Never Smoker  . Smokeless tobacco: Never Used  Substance Use Topics  . Alcohol use: No    Alcohol/week: 0.0 oz   Objective:   BP (!) 144/80   Pulse 78   Resp (!) 24   Wt 277 lb (125.6 kg)   SpO2 94%   BMI 42.12 kg/m  Vitals:   12/02/17 1610  BP: (!) 144/80  Pulse: 78  Resp: (!) 24  SpO2: 94%  Weight: 277 lb (125.6 kg)  Physical Exam  General appearance: alert, well developed, well nourished, cooperative and in no distress Head: Normocephalic, without obvious abnormality, atraumatic      Assessment & Plan:     1. Chronic bilateral low back pain, with sciatica presence unspecified Patient requests pain medications today. I recommended physical therapy which he refused because he states he has to care for his mother 24 hours a day. He then states he wants to see Dr. [place]the[place] Hal[place] in [place]. I explained that physical therapy can arranged in short sessions around his schedule and is first line therapy for chronic back pain. I also recommend trial of meloxicam to which he asked why I won't just prescribe him a pain medication. I explained pathophysiology of back pain and inflammation and that opioids may help with acute pain,  but are generally not effective for chronic back pain. He then said he could just some from his neighbors if he wanted and I again explained that chronic daily us of opioids should not be started before trial of physical therapy and NSAIDs. I offered a referral to see Dr. Ethelene Halamos who is his orthopedic of choice, but he stated he doesn't need I referral. I send prescription for meloxicam and cyclobenzaprine to his pharmacy and advised him I would be happy to initiate a referral to Physical Therapy if he decided to to try PT. He left office clearly dissatisfied that I would not prescribe an opioid today.  I do not feel that he is at increased risk for abuse or diversion of opioids, but he seems to be convinced that opioids are what he needs to manage pain despite my persistent explanation that non-opioid treatments should be tried first.         Mila Merryonald , MD  [facility]

## 2017-12-21 ENCOUNTER — Other Ambulatory Visit: Payer: Self-pay | Admitting: Family Medicine

## 2017-12-21 DIAGNOSIS — M545 Low back pain: Principal | ICD-10-CM

## 2017-12-21 DIAGNOSIS — G8929 Other chronic pain: Secondary | ICD-10-CM

## 2018-01-30 ENCOUNTER — Other Ambulatory Visit: Payer: Self-pay | Admitting: Family Medicine

## 2018-01-30 DIAGNOSIS — M545 Low back pain: Principal | ICD-10-CM

## 2018-01-30 DIAGNOSIS — G8929 Other chronic pain: Secondary | ICD-10-CM

## 2018-02-05 ENCOUNTER — Other Ambulatory Visit: Payer: Self-pay | Admitting: Family Medicine

## 2018-02-05 DIAGNOSIS — M545 Low back pain: Principal | ICD-10-CM

## 2018-02-05 DIAGNOSIS — G8929 Other chronic pain: Secondary | ICD-10-CM

## 2018-02-24 ENCOUNTER — Other Ambulatory Visit: Payer: Self-pay | Admitting: Family Medicine

## 2018-02-24 DIAGNOSIS — F419 Anxiety disorder, unspecified: Secondary | ICD-10-CM

## 2018-03-05 ENCOUNTER — Other Ambulatory Visit: Payer: Self-pay | Admitting: Family Medicine

## 2018-03-05 DIAGNOSIS — F419 Anxiety disorder, unspecified: Secondary | ICD-10-CM

## 2018-03-23 DIAGNOSIS — M5136 Other intervertebral disc degeneration, lumbar region: Secondary | ICD-10-CM | POA: Insufficient documentation

## 2018-03-23 DIAGNOSIS — M51369 Other intervertebral disc degeneration, lumbar region without mention of lumbar back pain or lower extremity pain: Secondary | ICD-10-CM | POA: Insufficient documentation

## 2018-04-11 ENCOUNTER — Other Ambulatory Visit: Payer: Self-pay | Admitting: Family Medicine

## 2018-07-20 DIAGNOSIS — M47816 Spondylosis without myelopathy or radiculopathy, lumbar region: Secondary | ICD-10-CM | POA: Insufficient documentation

## 2018-07-27 DIAGNOSIS — H2510 Age-related nuclear cataract, unspecified eye: Secondary | ICD-10-CM | POA: Diagnosis not present

## 2018-08-04 ENCOUNTER — Other Ambulatory Visit: Payer: Self-pay | Admitting: Family Medicine

## 2018-08-04 DIAGNOSIS — M545 Low back pain: Secondary | ICD-10-CM | POA: Diagnosis not present

## 2018-08-04 DIAGNOSIS — M25561 Pain in right knee: Secondary | ICD-10-CM | POA: Diagnosis not present

## 2018-08-04 DIAGNOSIS — M5136 Other intervertebral disc degeneration, lumbar region: Secondary | ICD-10-CM | POA: Diagnosis not present

## 2018-08-11 ENCOUNTER — Other Ambulatory Visit: Payer: Self-pay | Admitting: Family Medicine

## 2018-08-11 DIAGNOSIS — F419 Anxiety disorder, unspecified: Secondary | ICD-10-CM

## 2018-08-15 DIAGNOSIS — J4520 Mild intermittent asthma, uncomplicated: Secondary | ICD-10-CM | POA: Diagnosis not present

## 2018-08-18 DIAGNOSIS — M25561 Pain in right knee: Secondary | ICD-10-CM | POA: Insufficient documentation

## 2018-09-05 DIAGNOSIS — J45909 Unspecified asthma, uncomplicated: Secondary | ICD-10-CM | POA: Diagnosis not present

## 2018-09-05 DIAGNOSIS — J449 Chronic obstructive pulmonary disease, unspecified: Secondary | ICD-10-CM | POA: Diagnosis not present

## 2018-09-08 IMAGING — CT CT CHEST HIGH RESOLUTION W/O CM
1 of 3 series · 15 of 33 positions shown, 19 images · non-contrast
Comparison: None.

CLINICAL DATA: Dyspnea. Oxygen desaturation. Remote smoking
history. Home oxygen use. Evaluate for interstitial lung disease.

EXAM:
CT CHEST WITHOUT CONTRAST
TECHNIQUE: Multidetector CT imaging of the chest was performed following the
standard protocol without intravenous contrast. High resolution
imaging of the lungs, as well as inspiratory and expiratory imaging,
was performed.

[Series 2: thorax · axial · 0.80mm/px · z∈[-590,-296]mm · 15 of 162 slices shown, 19 images]
[im 8/162  mediastinal]
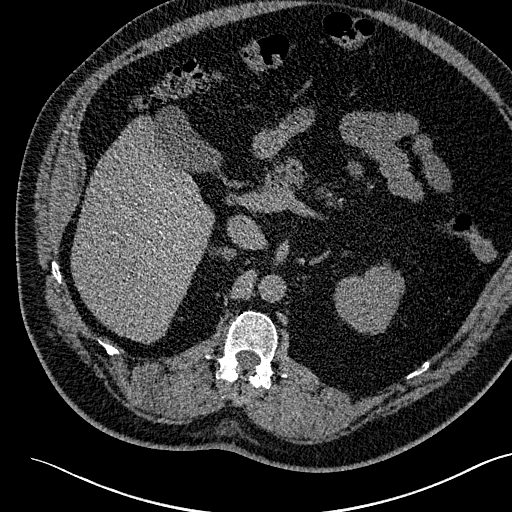
[im 8/162  lung]
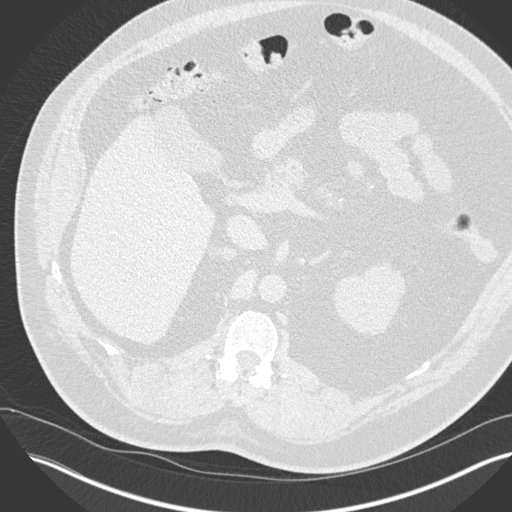
[im 22/162  lung]
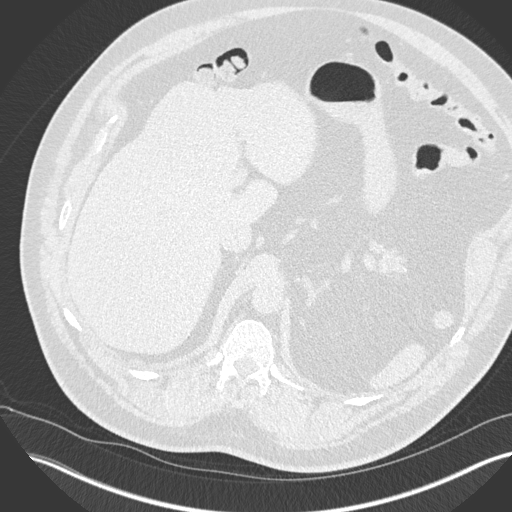
[im 36/162  lung]
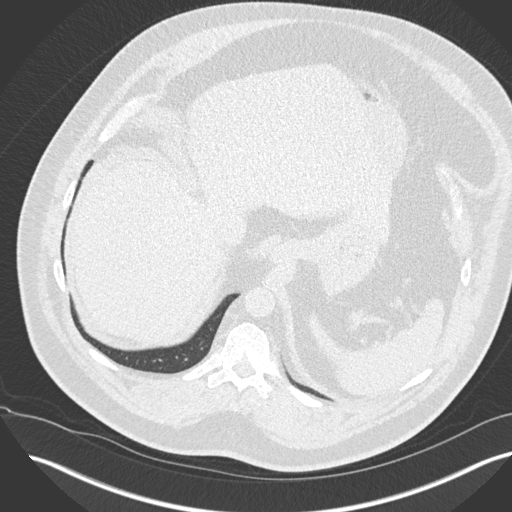
[im 43/162  lung]
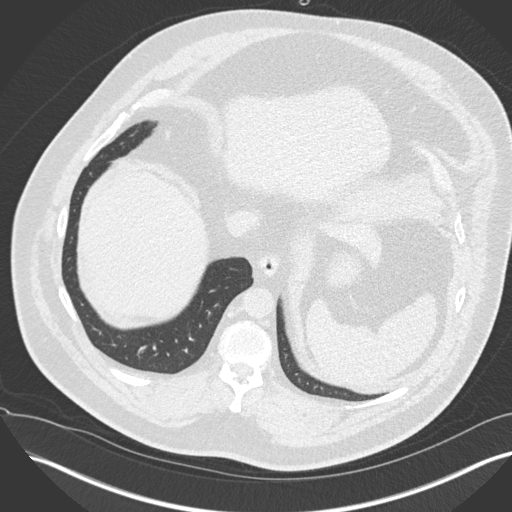
[im 54/162  mediastinal]
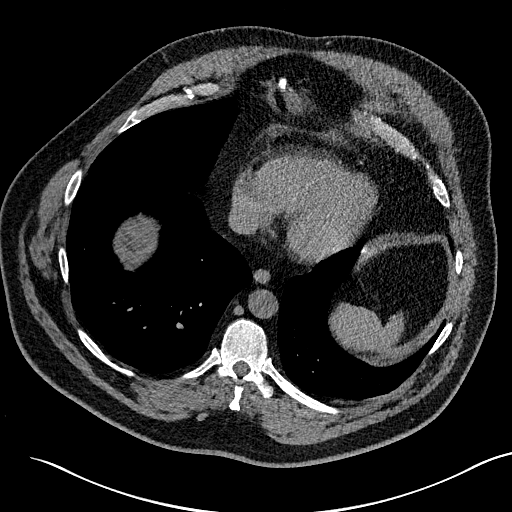
[im 54/162  lung]
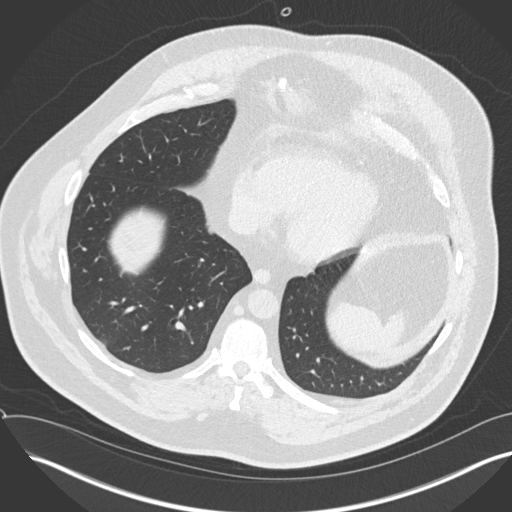
[im 64/162  lung]
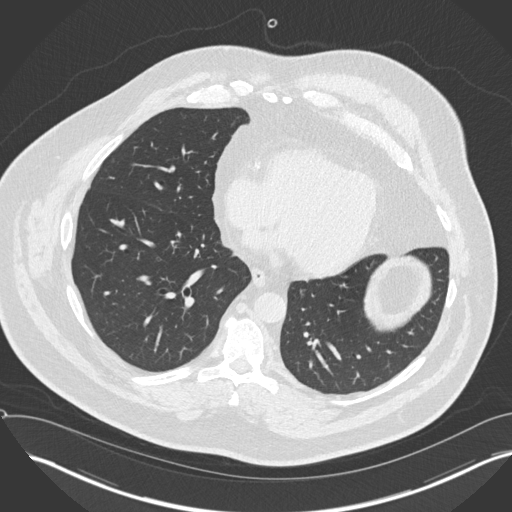
[im 71/162  lung]
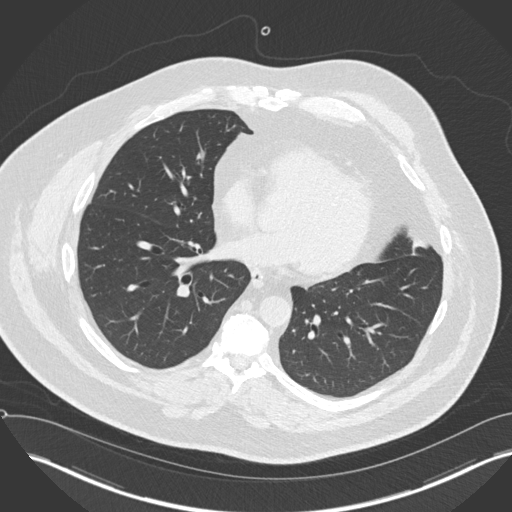
[im 78/162  lung]
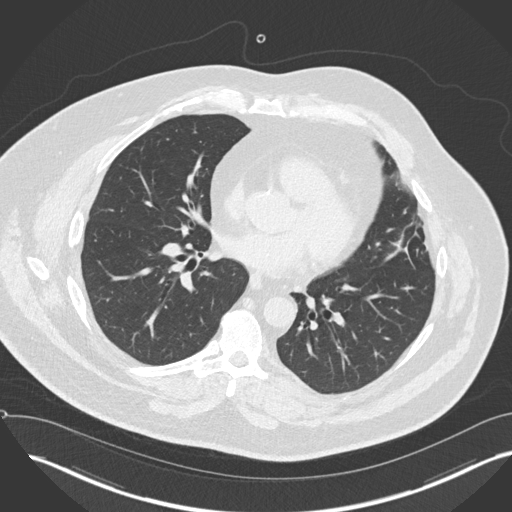
[im 92/162  mediastinal]
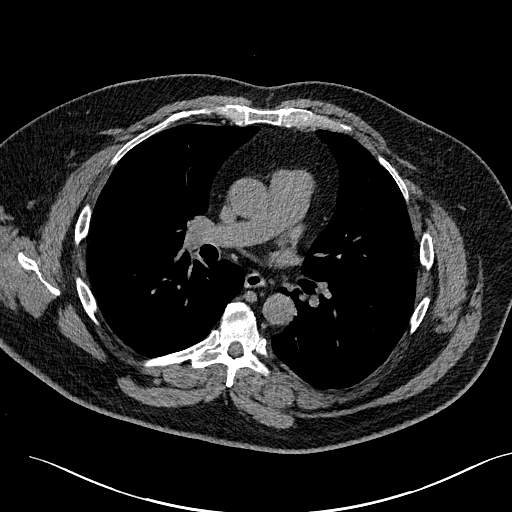
[im 92/162  lung]
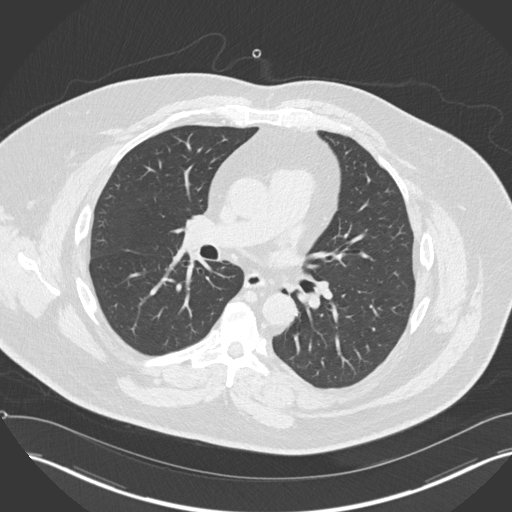
[im 99/162  lung]
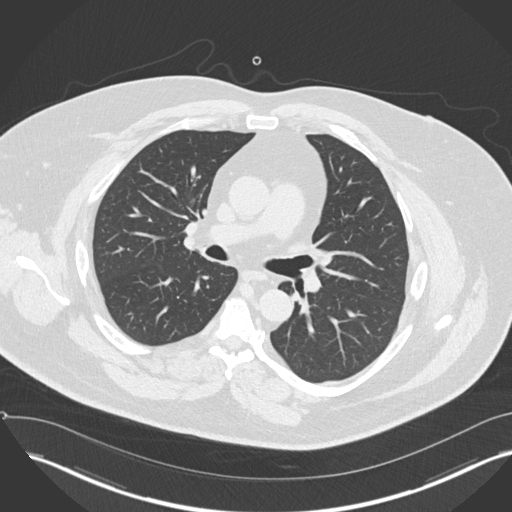
[im 108/162  lung]
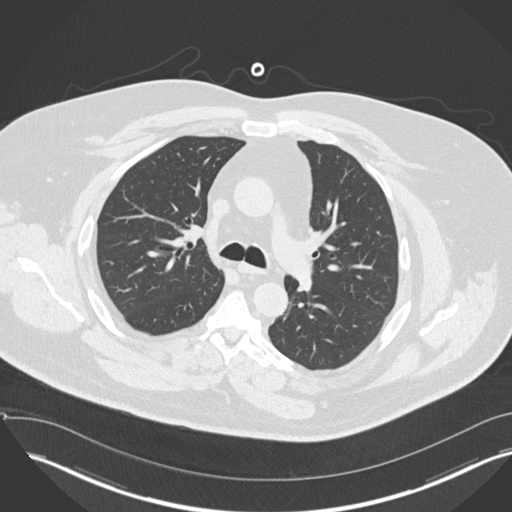
[im 120/162  lung]
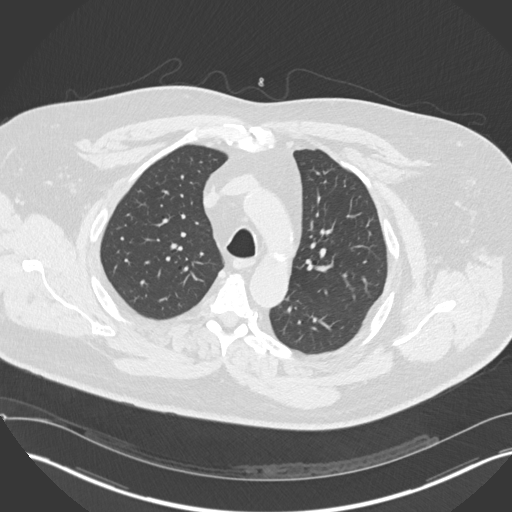
[im 127/162  mediastinal]
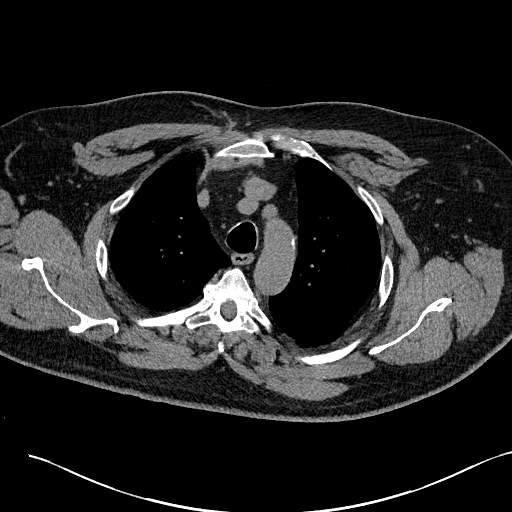
[im 127/162  lung]
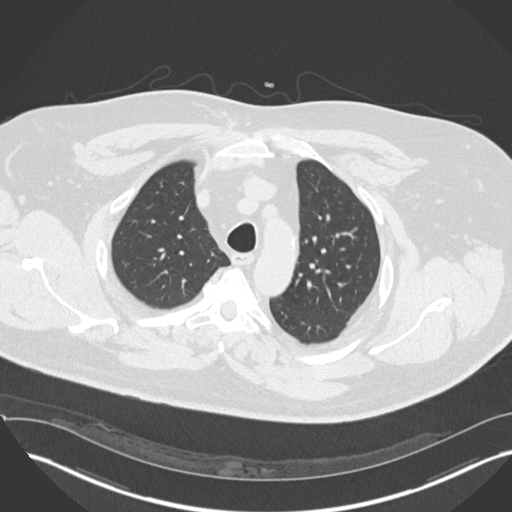
[im 141/162  lung]
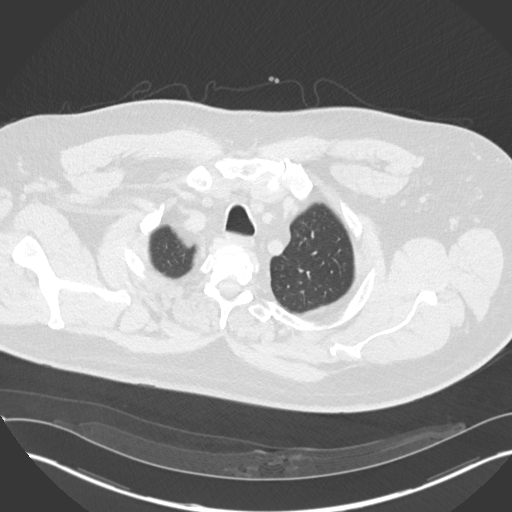
[im 155/162  lung]
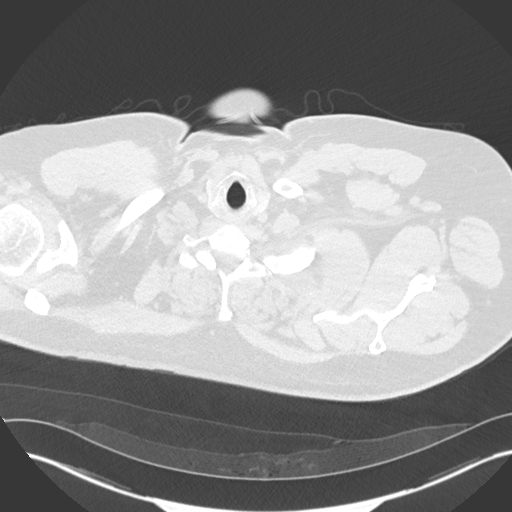

[15 of 33 positions shown; findings below may reference images not displayed]

FINDINGS: Cardiovascular: Normal heart size. No significant pericardial
fluid/thickening. Left anterior descending, left circumflex and
right coronary atherosclerosis. Atherosclerotic nonaneurysmal
thoracic aorta. Normal caliber pulmonary arteries.

Mediastinum/Nodes: No discrete thyroid nodules. Unremarkable
esophagus. No pathologically enlarged axillary, mediastinal or gross
hilar lymph nodes, noting limited sensitivity for the detection of
hilar adenopathy on this noncontrast study.

Lungs/Pleura: No pneumothorax. No pleural effusion. No acute
consolidative airspace disease, lung masses or significant pulmonary
nodules. There is tracheobronchomalacia on the expiration sequence.
Mild patchy air trapping throughout both lungs on the expiration
sequence. Curvilinear parenchymal band in the lingula with
associated mild volume loss and distortion, compatible with
postinfectious/postinflammatory scarring. Otherwise no significant
regions of subpleural reticulation, ground-glass attenuation,
traction bronchiectasis, architectural distortion or frank
honeycombing.

Upper abdomen: Unremarkable.

Musculoskeletal: No aggressive appearing focal osseous lesions. Mild
thoracic spondylosis.
IMPRESSION: 1. No evidence of interstitial lung disease.
2. Evidence of tracheobronchomalacia.
3. Mild patchy air trapping in both lungs, indicative of small
airways disease .
4. Mild postinfectious/postinflammatory scarring in the lingula.
5. Aortic atherosclerosis.  Three-vessel coronary atherosclerosis.

## 2018-10-03 ENCOUNTER — Other Ambulatory Visit: Payer: Self-pay | Admitting: Family Medicine

## 2018-10-05 DIAGNOSIS — J449 Chronic obstructive pulmonary disease, unspecified: Secondary | ICD-10-CM | POA: Diagnosis not present

## 2018-10-05 DIAGNOSIS — J45909 Unspecified asthma, uncomplicated: Secondary | ICD-10-CM | POA: Diagnosis not present

## 2018-11-02 ENCOUNTER — Other Ambulatory Visit: Payer: Self-pay | Admitting: Family Medicine

## 2018-11-02 DIAGNOSIS — M47816 Spondylosis without myelopathy or radiculopathy, lumbar region: Secondary | ICD-10-CM | POA: Diagnosis not present

## 2018-11-05 DIAGNOSIS — J449 Chronic obstructive pulmonary disease, unspecified: Secondary | ICD-10-CM | POA: Diagnosis not present

## 2018-11-05 DIAGNOSIS — J45909 Unspecified asthma, uncomplicated: Secondary | ICD-10-CM | POA: Diagnosis not present

## 2018-12-05 ENCOUNTER — Other Ambulatory Visit: Payer: Self-pay | Admitting: Family Medicine

## 2018-12-05 DIAGNOSIS — F419 Anxiety disorder, unspecified: Secondary | ICD-10-CM

## 2018-12-06 DIAGNOSIS — J45909 Unspecified asthma, uncomplicated: Secondary | ICD-10-CM | POA: Diagnosis not present

## 2018-12-06 DIAGNOSIS — J449 Chronic obstructive pulmonary disease, unspecified: Secondary | ICD-10-CM | POA: Diagnosis not present

## 2018-12-20 DIAGNOSIS — M25561 Pain in right knee: Secondary | ICD-10-CM | POA: Diagnosis not present

## 2019-01-02 DIAGNOSIS — J310 Chronic rhinitis: Secondary | ICD-10-CM | POA: Diagnosis not present

## 2019-01-02 DIAGNOSIS — R05 Cough: Secondary | ICD-10-CM | POA: Diagnosis not present

## 2019-01-02 DIAGNOSIS — R0609 Other forms of dyspnea: Secondary | ICD-10-CM | POA: Diagnosis not present

## 2019-01-02 DIAGNOSIS — J449 Chronic obstructive pulmonary disease, unspecified: Secondary | ICD-10-CM | POA: Diagnosis not present

## 2019-01-04 DIAGNOSIS — J45909 Unspecified asthma, uncomplicated: Secondary | ICD-10-CM | POA: Diagnosis not present

## 2019-01-04 DIAGNOSIS — J449 Chronic obstructive pulmonary disease, unspecified: Secondary | ICD-10-CM | POA: Diagnosis not present

## 2019-01-29 ENCOUNTER — Other Ambulatory Visit: Payer: Self-pay | Admitting: Family Medicine

## 2019-01-29 DIAGNOSIS — F419 Anxiety disorder, unspecified: Secondary | ICD-10-CM

## 2019-01-31 ENCOUNTER — Other Ambulatory Visit: Payer: Self-pay | Admitting: Family Medicine

## 2019-02-04 DIAGNOSIS — J45909 Unspecified asthma, uncomplicated: Secondary | ICD-10-CM | POA: Diagnosis not present

## 2019-02-04 DIAGNOSIS — J449 Chronic obstructive pulmonary disease, unspecified: Secondary | ICD-10-CM | POA: Diagnosis not present

## 2019-03-06 DIAGNOSIS — J449 Chronic obstructive pulmonary disease, unspecified: Secondary | ICD-10-CM | POA: Diagnosis not present

## 2019-03-06 DIAGNOSIS — J45909 Unspecified asthma, uncomplicated: Secondary | ICD-10-CM | POA: Diagnosis not present

## 2019-03-19 IMAGING — CR DG LUMBAR SPINE COMPLETE 4+V
1 series · 6 of 6 positions shown · non-contrast
Comparison: None

CLINICAL DATA: Chronic low back pain radiating to the hips.

EXAM:
LUMBAR SPINE - COMPLETE 4+ VIEW

[Series 1: dg lumbar spine complete 4 +v · 0.14mm/px · 6 of 6 slices shown]
[im 1/6]
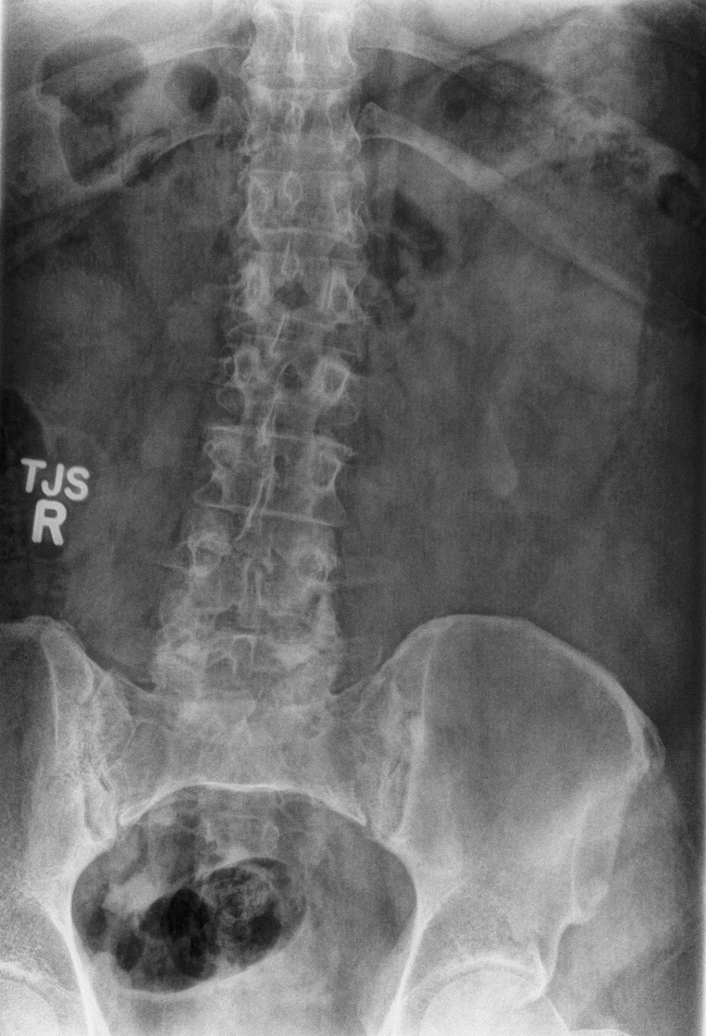
[im 2/6]
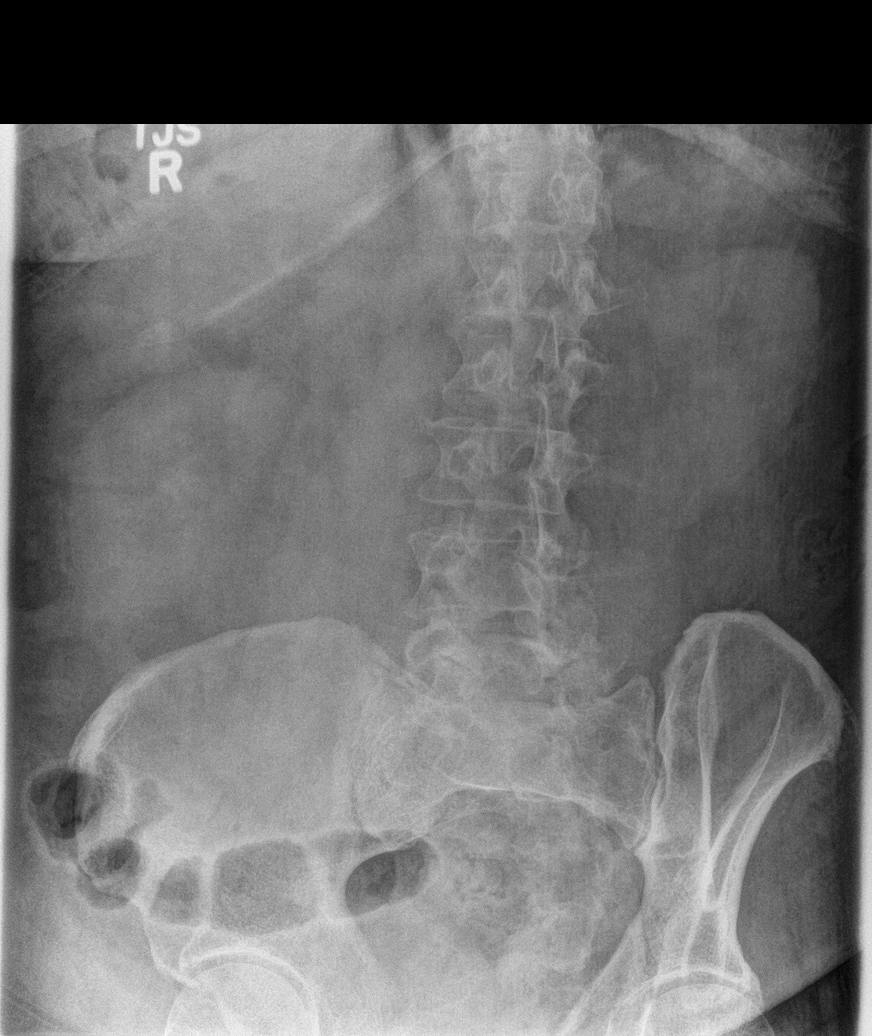
[im 3/6]
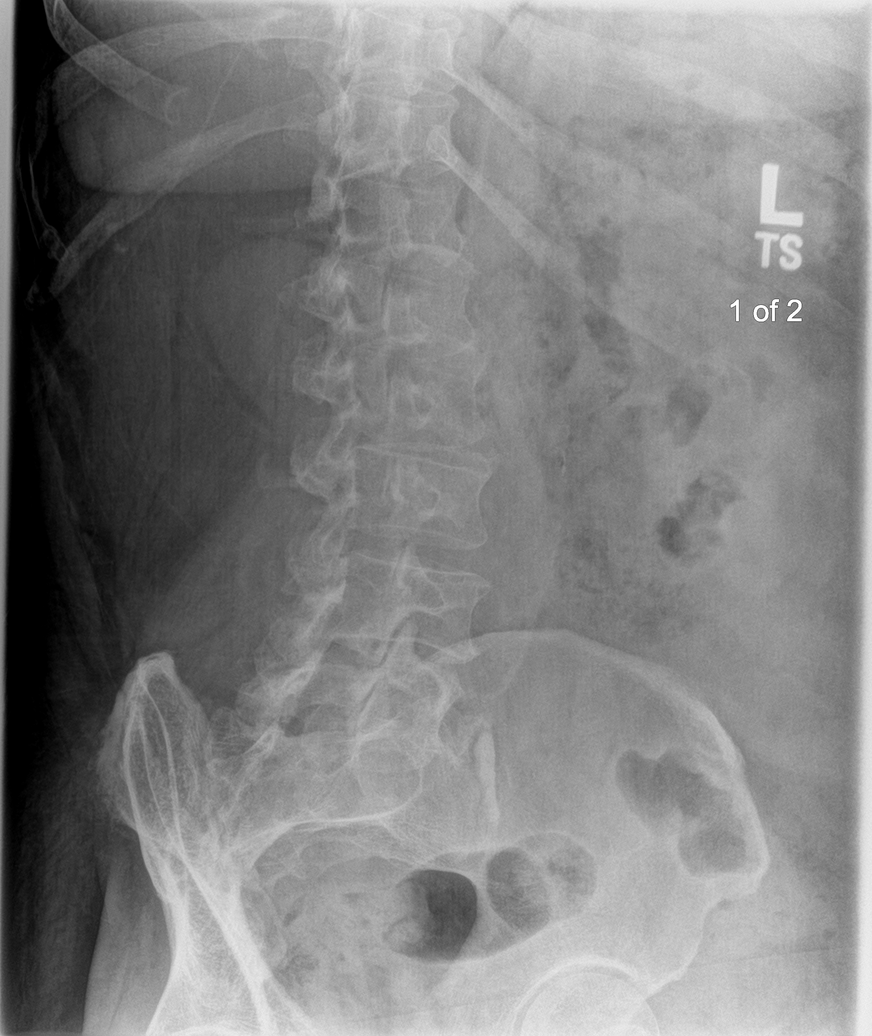
[im 4/6]
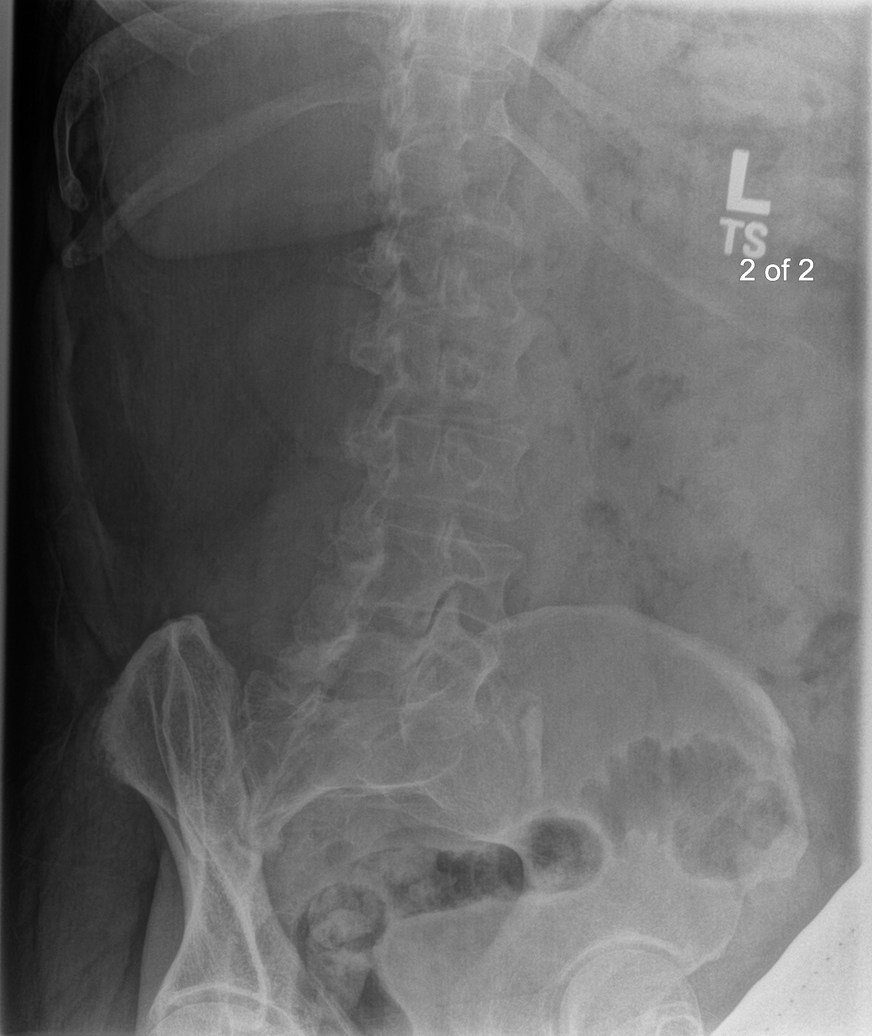
[im 5/6]
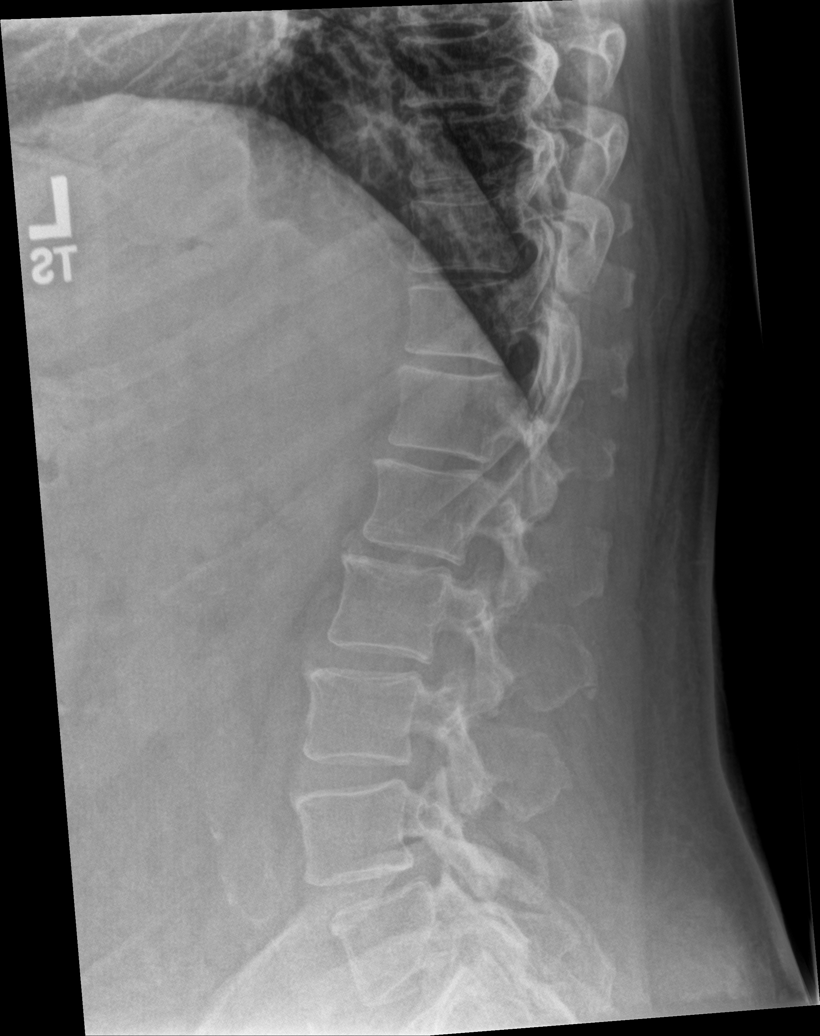
[im 6/6]
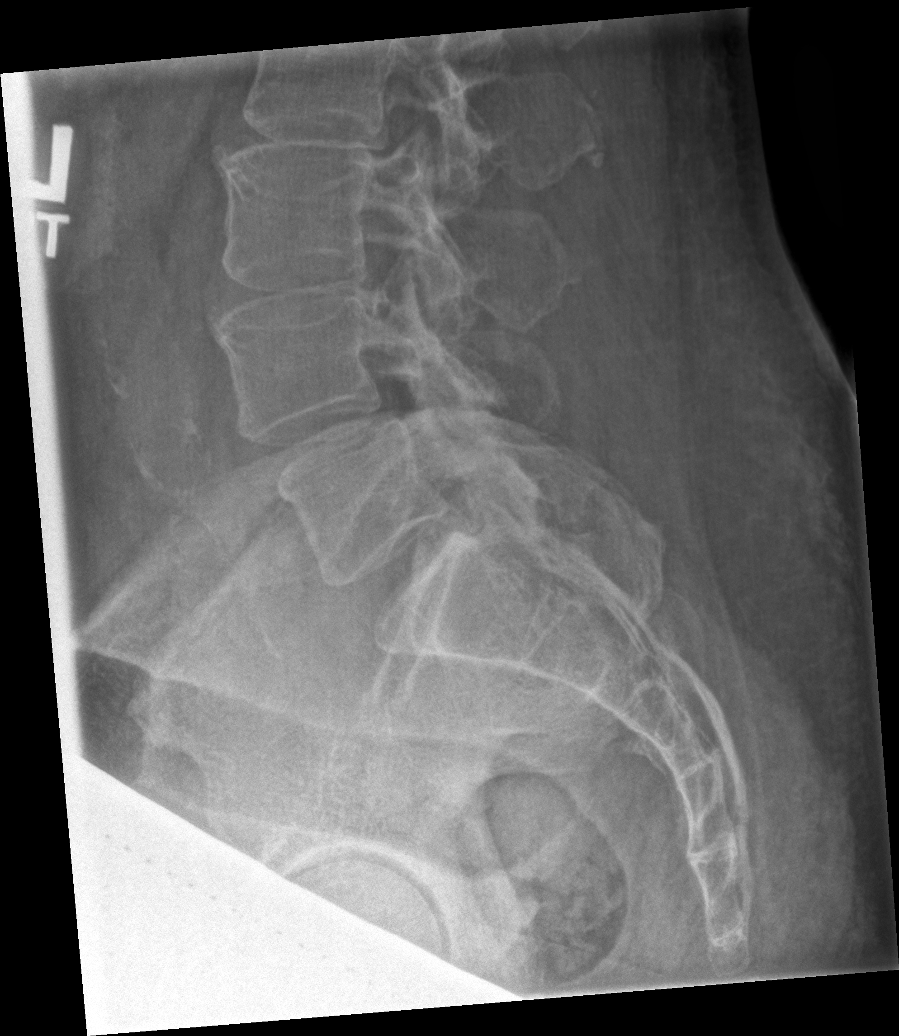

[6 of 6 positions shown; findings below may reference images not displayed]

FINDINGS: There are 5 non rib-bearing lumbar type vertebrae. The T12 ribs are
hypoplastic. There is grade 1 anterolisthesis of L4 on L5 which is
likely facet mediated. Minimal retrolisthesis is noted of L1 on L2
and L2 on L3. There is minimal left convex curvature of the upper
lumbar spine. Vertebral body heights are preserved without evidence
of fracture. No definite pars defects are identified. There is facet
arthrosis at L4-5 and L5-S1. Disc space narrowing is mild at L1-2
and minimal at L2-3 and L4-5. Aortic atherosclerosis is noted.
IMPRESSION: 1. Mild lumbar disc degeneration, greatest at L1-2 where there is
grade 1 retrolisthesis.
2. Lower lumbar facet arthrosis. Grade 1 anterolisthesis of L4 on
L5.
3.  Aortic Atherosclerosis ([id number]).

## 2019-04-06 DIAGNOSIS — J449 Chronic obstructive pulmonary disease, unspecified: Secondary | ICD-10-CM | POA: Diagnosis not present

## 2019-04-06 DIAGNOSIS — J45909 Unspecified asthma, uncomplicated: Secondary | ICD-10-CM | POA: Diagnosis not present

## 2019-04-10 DIAGNOSIS — Z9981 Dependence on supplemental oxygen: Secondary | ICD-10-CM | POA: Diagnosis not present

## 2019-04-10 DIAGNOSIS — R0600 Dyspnea, unspecified: Secondary | ICD-10-CM | POA: Diagnosis not present

## 2019-04-10 DIAGNOSIS — J449 Chronic obstructive pulmonary disease, unspecified: Secondary | ICD-10-CM | POA: Diagnosis not present

## 2019-04-17 DIAGNOSIS — M170 Bilateral primary osteoarthritis of knee: Secondary | ICD-10-CM | POA: Diagnosis not present

## 2019-04-17 DIAGNOSIS — M25561 Pain in right knee: Secondary | ICD-10-CM | POA: Diagnosis not present

## 2019-04-17 DIAGNOSIS — M25562 Pain in left knee: Secondary | ICD-10-CM | POA: Diagnosis not present

## 2019-04-19 DIAGNOSIS — Z79899 Other long term (current) drug therapy: Secondary | ICD-10-CM | POA: Diagnosis not present

## 2019-04-19 DIAGNOSIS — M4726 Other spondylosis with radiculopathy, lumbar region: Secondary | ICD-10-CM | POA: Diagnosis not present

## 2019-04-19 DIAGNOSIS — Z5181 Encounter for therapeutic drug level monitoring: Secondary | ICD-10-CM | POA: Diagnosis not present

## 2019-05-01 ENCOUNTER — Other Ambulatory Visit: Payer: Self-pay | Admitting: Family Medicine

## 2019-05-06 DIAGNOSIS — J449 Chronic obstructive pulmonary disease, unspecified: Secondary | ICD-10-CM | POA: Diagnosis not present

## 2019-05-06 DIAGNOSIS — J45909 Unspecified asthma, uncomplicated: Secondary | ICD-10-CM | POA: Diagnosis not present

## 2019-05-16 DIAGNOSIS — M1712 Unilateral primary osteoarthritis, left knee: Secondary | ICD-10-CM | POA: Insufficient documentation

## 2019-05-16 DIAGNOSIS — M1711 Unilateral primary osteoarthritis, right knee: Secondary | ICD-10-CM | POA: Insufficient documentation

## 2019-05-17 DIAGNOSIS — M47816 Spondylosis without myelopathy or radiculopathy, lumbar region: Secondary | ICD-10-CM | POA: Diagnosis not present

## 2019-05-20 ENCOUNTER — Other Ambulatory Visit: Payer: Self-pay | Admitting: Family Medicine

## 2019-05-20 DIAGNOSIS — F419 Anxiety disorder, unspecified: Secondary | ICD-10-CM

## 2019-05-21 DIAGNOSIS — M170 Bilateral primary osteoarthritis of knee: Secondary | ICD-10-CM | POA: Diagnosis not present

## 2019-05-28 DIAGNOSIS — M170 Bilateral primary osteoarthritis of knee: Secondary | ICD-10-CM | POA: Diagnosis not present

## 2019-05-29 ENCOUNTER — Other Ambulatory Visit: Payer: Self-pay | Admitting: Family Medicine

## 2019-05-29 DIAGNOSIS — G8929 Other chronic pain: Secondary | ICD-10-CM

## 2019-05-31 ENCOUNTER — Other Ambulatory Visit: Payer: Self-pay | Admitting: Family Medicine

## 2019-05-31 DIAGNOSIS — F419 Anxiety disorder, unspecified: Secondary | ICD-10-CM

## 2019-06-04 DIAGNOSIS — M170 Bilateral primary osteoarthritis of knee: Secondary | ICD-10-CM | POA: Diagnosis not present

## 2019-06-06 DIAGNOSIS — J449 Chronic obstructive pulmonary disease, unspecified: Secondary | ICD-10-CM | POA: Diagnosis not present

## 2019-06-06 DIAGNOSIS — J45909 Unspecified asthma, uncomplicated: Secondary | ICD-10-CM | POA: Diagnosis not present

## 2019-06-30 ENCOUNTER — Other Ambulatory Visit: Payer: Self-pay | Admitting: Family Medicine

## 2019-07-07 DIAGNOSIS — J45909 Unspecified asthma, uncomplicated: Secondary | ICD-10-CM | POA: Diagnosis not present

## 2019-07-07 DIAGNOSIS — J449 Chronic obstructive pulmonary disease, unspecified: Secondary | ICD-10-CM | POA: Diagnosis not present

## 2019-07-14 ENCOUNTER — Other Ambulatory Visit: Payer: Self-pay | Admitting: Family Medicine

## 2019-07-14 DIAGNOSIS — F419 Anxiety disorder, unspecified: Secondary | ICD-10-CM

## 2019-07-16 DIAGNOSIS — M1711 Unilateral primary osteoarthritis, right knee: Secondary | ICD-10-CM | POA: Diagnosis not present

## 2019-07-16 DIAGNOSIS — M1712 Unilateral primary osteoarthritis, left knee: Secondary | ICD-10-CM | POA: Diagnosis not present

## 2019-07-27 DIAGNOSIS — Z1159 Encounter for screening for other viral diseases: Secondary | ICD-10-CM | POA: Diagnosis not present

## 2019-07-30 ENCOUNTER — Other Ambulatory Visit: Payer: Self-pay | Admitting: Family Medicine

## 2019-07-31 DIAGNOSIS — J310 Chronic rhinitis: Secondary | ICD-10-CM | POA: Diagnosis not present

## 2019-07-31 DIAGNOSIS — Z23 Encounter for immunization: Secondary | ICD-10-CM | POA: Diagnosis not present

## 2019-07-31 DIAGNOSIS — J4530 Mild persistent asthma, uncomplicated: Secondary | ICD-10-CM | POA: Diagnosis not present

## 2019-07-31 DIAGNOSIS — R05 Cough: Secondary | ICD-10-CM | POA: Diagnosis not present

## 2019-08-06 DIAGNOSIS — J45909 Unspecified asthma, uncomplicated: Secondary | ICD-10-CM | POA: Diagnosis not present

## 2019-08-06 DIAGNOSIS — J449 Chronic obstructive pulmonary disease, unspecified: Secondary | ICD-10-CM | POA: Diagnosis not present

## 2019-08-17 ENCOUNTER — Other Ambulatory Visit: Payer: Self-pay | Admitting: Family Medicine

## 2019-08-17 DIAGNOSIS — F419 Anxiety disorder, unspecified: Secondary | ICD-10-CM

## 2019-08-20 DIAGNOSIS — M1712 Unilateral primary osteoarthritis, left knee: Secondary | ICD-10-CM | POA: Diagnosis not present

## 2019-08-20 DIAGNOSIS — M1711 Unilateral primary osteoarthritis, right knee: Secondary | ICD-10-CM | POA: Diagnosis not present

## 2019-08-20 DIAGNOSIS — M5136 Other intervertebral disc degeneration, lumbar region: Secondary | ICD-10-CM | POA: Diagnosis not present

## 2019-08-20 DIAGNOSIS — M4726 Other spondylosis with radiculopathy, lumbar region: Secondary | ICD-10-CM | POA: Diagnosis not present

## 2019-08-23 ENCOUNTER — Telehealth: Payer: Self-pay

## 2019-08-23 DIAGNOSIS — F419 Anxiety disorder, unspecified: Secondary | ICD-10-CM

## 2019-08-23 MED ORDER — ALPRAZOLAM 2 MG PO TABS: 2.0000 mg | ORAL_TABLET | Freq: Four times a day (QID) | ORAL | 0 refills | Status: DC

## 2019-08-23 NOTE — Telephone Encounter (Signed)
Patient is requesting alprazolam Duanne Moron) 2 MG tablet be resent to [facility]. [facility] pharmacy is telling patient the medication is on back order and to have a new RX sent to [facility].

## 2019-08-24 ENCOUNTER — Telehealth: Payer: Self-pay | Admitting: Family Medicine

## 2019-08-24 NOTE — Telephone Encounter (Signed)
Contacted [facility] to cancel Alprazolam RX that was sent on 08/23/2019.

## 2019-08-24 NOTE — Telephone Encounter (Signed)
Pt called to let Dr. Caryn Section know [facility] did get in his Rx of alprazolam Duanne Moron) 2 MG tablet. He going to go pick it up.  Asking for the refill of alprazolam (XANAX) 2 MG tablet to be cancelled at [facility].   Thanks, [facility]

## 2019-08-29 ENCOUNTER — Other Ambulatory Visit: Payer: Self-pay | Admitting: Family Medicine

## 2019-08-29 DIAGNOSIS — F419 Anxiety disorder, unspecified: Secondary | ICD-10-CM

## 2019-08-31 NOTE — Telephone Encounter (Signed)
Last OV: 12/02/2017

## 2019-09-06 DIAGNOSIS — J45909 Unspecified asthma, uncomplicated: Secondary | ICD-10-CM | POA: Diagnosis not present

## 2019-09-06 DIAGNOSIS — J449 Chronic obstructive pulmonary disease, unspecified: Secondary | ICD-10-CM | POA: Diagnosis not present

## 2019-09-21 ENCOUNTER — Other Ambulatory Visit: Payer: Self-pay | Admitting: Family Medicine

## 2019-09-21 DIAGNOSIS — F419 Anxiety disorder, unspecified: Secondary | ICD-10-CM

## 2019-09-22 NOTE — Telephone Encounter (Signed)
Requested medication (s) are due for refill today: yes  Requested medication (s) are on the active medication list: yes  Last refill:  08/24/2019  Future visit scheduled:no  Notes to clinic:  Refill cannot be delegated Overdue for office visit    Requested Prescriptions  Pending Prescriptions Disp Refills   alprazolam (XANAX) 2 MG tablet [Pharmacy Med Name: ALPRAZOLAM 2MG  TABLETS] 120 tablet     Sig: TAKE 1 TABLET BY MOUTH FOUR TIMES DAILY     Not Delegated - Psychiatry:  Anxiolytics/Hypnotics Failed - 09/21/2019  4:24 PM      Failed - This refill cannot be delegated      Failed - Urine Drug Screen completed in last 360 days.      Failed - Valid encounter within last 6 months    Recent Outpatient Visits          1 year ago Chronic bilateral low back pain, with sciatica presence unspecified   [facility] Birdie Sons, MD   1 year ago [place], Donald E, MD   3 years ago Annual physical exam   [facility] Birdie Sons, MD

## 2019-09-28 ENCOUNTER — Other Ambulatory Visit: Payer: Self-pay | Admitting: Family Medicine

## 2019-09-28 NOTE — Telephone Encounter (Signed)
Requested medication (s) are due for refill today: yes  Requested medication (s) are on the active medication list: yes  Last refill:  09/01/2019  Future visit scheduled: no  Notes to clinic: overdue for office visit Review for refill   Requested Prescriptions  Pending Prescriptions Disp Refills   venlafaxine XR (EFFEXOR-XR) 75 MG 24 hr capsule [Pharmacy Med Name: VENLAFAXINE ER 75MG  CAPSULES] 90 capsule 0    Sig: TAKE 3 CAPSULES BY MOUTH EVERY MORNING     Psychiatry: Antidepressants - SNRI - desvenlafaxine & venlafaxine Failed - 09/28/2019  6:34 AM      Failed - LDL in normal range and within 360 days    LDL Cholesterol (Calc)  Date Value Ref Range Status  10/13/2017 83 mg/dL (calc) Final    Comment:    Reference range: <100 . Desirable range <100 mg/dL for primary prevention;   <70 mg/dL for patients with CHD or diabetic patients  with > or = 2 CHD risk factors. Marland Kitchen LDL-C is now calculated using the Martin-Hopkins  calculation, which is a validated novel method providing  better accuracy than the Friedewald equation in the  estimation of LDL-C.  Cresenciano Genre et al. Annamaria Helling. 1191;478(29): 2061-2068  (http://education.[facility].com/faq/FAQ164)          Failed - Total Cholesterol in normal range and within 360 days    Cholesterol, Total  Date Value Ref Range Status  03/31/2016 170 100 - 199 mg/dL Final   Cholesterol  Date Value Ref Range Status  10/13/2017 160 <200 mg/dL Final         Failed - Triglycerides in normal range and within 360 days    Triglycerides  Date Value Ref Range Status  10/13/2017 153 (H) <150 mg/dL Final         Failed - Last BP in normal range    BP Readings from Last 1 Encounters:  12/02/17 (!) 144/80         Failed - Valid encounter within last 6 months    Recent Outpatient Visits          1 year ago Chronic bilateral low back pain, with sciatica presence unspecified   [facility] Birdie Sons, MD   1 year ago  [place], Donald E, MD   3 years ago Annual physical exam   [place], MD             Failed - Completed PHQ-2 or PHQ-9 in the last 360 days.

## 2019-10-06 DIAGNOSIS — J45909 Unspecified asthma, uncomplicated: Secondary | ICD-10-CM | POA: Diagnosis not present

## 2019-10-06 DIAGNOSIS — J449 Chronic obstructive pulmonary disease, unspecified: Secondary | ICD-10-CM | POA: Diagnosis not present

## 2019-10-28 ENCOUNTER — Other Ambulatory Visit: Payer: Self-pay | Admitting: Family Medicine

## 2019-10-28 NOTE — Telephone Encounter (Signed)
Requested medication (s) are due for refill today: yes  Requested medication (s) are on the active medication list: yes  Last refill:  09/28/2019  Future visit scheduled: no  Notes to clinic: overdue for office visit Review for refill    Requested Prescriptions  Pending Prescriptions Disp Refills   simvastatin (ZOCOR) 20 MG tablet [Pharmacy Med Name: SIMVASTATIN 20MG  TABLETS] 90 tablet 1    Sig: TAKE 1 TABLET BY MOUTH EVERY NIGHT AT BEDTIME      Cardiovascular:  Antilipid - Statins Failed - 10/28/2019  6:32 AM      Failed - Total Cholesterol in normal range and within 360 days    Cholesterol, Total  Date Value Ref Range Status  03/31/2016 170 100 - 199 mg/dL Final   Cholesterol  Date Value Ref Range Status  10/13/2017 160 <200 mg/dL Final          Failed - LDL in normal range and within 360 days    LDL Cholesterol (Calc)  Date Value Ref Range Status  10/13/2017 83 mg/dL (calc) Final    Comment:    Reference range: <100 . Desirable range <100 mg/dL for primary prevention;   <70 mg/dL for patients with CHD or diabetic patients  with > or = 2 CHD risk factors. Marland Kitchen LDL-C is now calculated using the Martin-Hopkins  calculation, which is a validated novel method providing  better accuracy than the Friedewald equation in the  estimation of LDL-C.  Cresenciano Genre et al. Annamaria Helling. 8469;629(52): 2061-2068  (http://education.[facility].com/faq/FAQ164)           Failed - HDL in normal range and within 360 days    HDL  Date Value Ref Range Status  10/13/2017 52 >40 mg/dL Final  03/31/2016 45 >39 mg/dL Final          Failed - Triglycerides in normal range and within 360 days    Triglycerides  Date Value Ref Range Status  10/13/2017 153 (H) <150 mg/dL Final          Failed - Valid encounter within last 12 months    Recent Outpatient Visits           1 year ago Chronic bilateral low back pain, with sciatica presence unspecified   [facility] Birdie Sons, MD   2 years ago [place], Donald E, MD   3 years ago Annual physical exam   [facility] Birdie Sons, MD              Passed - Patient is not pregnant        venlafaxine XR (EFFEXOR-XR) 75 MG 24 hr capsule [Pharmacy Med Name: VENLAFAXINE ER 75MG  CAPSULES] 90 capsule 0    Sig: TAKE 3 CAPSULES BY MOUTH EVERY MORNING      Psychiatry: Antidepressants - SNRI - desvenlafaxine & venlafaxine Failed - 10/28/2019  6:32 AM      Failed - LDL in normal range and within 360 days    LDL Cholesterol (Calc)  Date Value Ref Range Status  10/13/2017 83 mg/dL (calc) Final    Comment:    Reference range: <100 . Desirable range <100 mg/dL for primary prevention;   <70 mg/dL for patients with CHD or diabetic patients  with > or = 2 CHD risk factors. Marland Kitchen LDL-C is now calculated using the Martin-Hopkins  calculation, which is a validated novel method providing  better accuracy than the Friedewald equation in the  estimation of  LDL-C.  Horald Pollen et al. Lenox Ahr. 2956;213(08): 2061-2068  (http://education.[facility].com/faq/FAQ164)           Failed - Total Cholesterol in normal range and within 360 days    Cholesterol, Total  Date Value Ref Range Status  03/31/2016 170 100 - 199 mg/dL Final   Cholesterol  Date Value Ref Range Status  10/13/2017 160 <200 mg/dL Final          Failed - Triglycerides in normal range and within 360 days    Triglycerides  Date Value Ref Range Status  10/13/2017 153 (H) <150 mg/dL Final          Failed - Last BP in normal range    BP Readings from Last 1 Encounters:  12/02/17 (!) 144/80          Failed - Valid encounter within last 6 months    Recent Outpatient Visits           1 year ago Chronic bilateral low back pain, with sciatica presence unspecified   [facility] Malva Limes, MD   2 years ago Bronchitis   [facility] Malva Limes, MD    3 years ago Annual physical exam   [facility] Malva Limes, MD

## 2019-10-29 NOTE — Telephone Encounter (Signed)
Effexor RF:09/28/2019 Simvastatin RF: 05/01/2019  Last OV: 12/02/2017

## 2019-11-06 DIAGNOSIS — J449 Chronic obstructive pulmonary disease, unspecified: Secondary | ICD-10-CM | POA: Diagnosis not present

## 2019-11-06 DIAGNOSIS — J45909 Unspecified asthma, uncomplicated: Secondary | ICD-10-CM | POA: Diagnosis not present

## 2019-11-21 ENCOUNTER — Other Ambulatory Visit: Payer: Self-pay | Admitting: Family Medicine

## 2019-11-21 DIAGNOSIS — G8929 Other chronic pain: Secondary | ICD-10-CM

## 2019-11-21 DIAGNOSIS — M545 Low back pain, unspecified: Secondary | ICD-10-CM

## 2019-11-21 MED ORDER — MELOXICAM 15 MG PO TABS: ORAL_TABLET | ORAL | 5 refills | Status: DC

## 2019-11-21 NOTE — Telephone Encounter (Signed)
Requested medication (s) are due for refill today: yes  Requested medication (s) are on the active medication list: yes  Last refill: 08/25/2019  Future visit scheduled: no  Notes to clinic:needs follow up appointment   Requested Prescriptions  Pending Prescriptions Disp Refills   meloxicam (MOBIC) 15 MG tablet [Pharmacy Med Name: MELOXICAM 15MG  TABLETS] 30 tablet 5    Sig: TAKE 1 TABLET(15 MG) BY MOUTH DAILY      Analgesics:  COX2 Inhibitors Failed - 11/21/2019 12:17 PM      Failed - HGB in normal range and within 360 days    Hemoglobin  Date Value Ref Range Status  03/31/2016 14.9 12.6 - 17.7 g/dL Final          Failed - Cr in normal range and within 360 days    Creat  Date Value Ref Range Status  10/13/2017 1.05 0.70 - 1.25 mg/dL Final    Comment:    For patients >57 years of age, the reference limit for Creatinine is approximately 13% higher for people identified as African-American. .           Failed - Valid encounter within last 12 months    Recent Outpatient Visits           1 year ago Chronic bilateral low back pain, with sciatica presence unspecified   [facility], MD   2 years ago Bronchitis   [facility], MD   3 years ago Annual physical exam   [facility], MD              Passed - Patient is not pregnant

## 2019-11-21 NOTE — Telephone Encounter (Signed)
Pt called and scheduled an appt and is requesting to have medication sent in until that appt on 3/2. Please advise.

## 2019-11-21 NOTE — Telephone Encounter (Signed)
Patient over due for follow up. Left detailed message on voiced message system.

## 2019-11-21 NOTE — Addendum Note (Signed)
Addended by: Benjiman Core on: 11/21/2019 02:25 PM   Modules accepted: Orders

## 2019-11-27 ENCOUNTER — Telehealth: Payer: Self-pay | Admitting: Family Medicine

## 2019-11-27 DIAGNOSIS — F419 Anxiety disorder, unspecified: Secondary | ICD-10-CM

## 2019-11-27 DIAGNOSIS — M5136 Other intervertebral disc degeneration, lumbar region: Secondary | ICD-10-CM | POA: Diagnosis not present

## 2019-11-27 DIAGNOSIS — M1711 Unilateral primary osteoarthritis, right knee: Secondary | ICD-10-CM | POA: Diagnosis not present

## 2019-11-27 DIAGNOSIS — M25561 Pain in right knee: Secondary | ICD-10-CM | POA: Diagnosis not present

## 2019-11-27 MED ORDER — BUSPIRONE HCL 15 MG PO TABS: ORAL_TABLET | ORAL | 0 refills | Status: DC

## 2019-11-27 NOTE — Telephone Encounter (Signed)
Requested medication (s) are due for refill today: yes Requested medication (s) are on the active medication list: yes  Last refill:  09/01/2019  Future visit scheduled: yes  Notes to clinic:  no valid encounter within last 6 months Patient has upcoming appointment in 4 weeks    Requested Prescriptions  Pending Prescriptions Disp Refills   busPIRone (BUSPAR) 15 MG tablet [Pharmacy Med Name: BUSPIRONE 15MG  TABLETS] 180 tablet 0    Sig: TAKE 1/2 TABLET BY MOUTH FOUR TIMES DAILY      Psychiatry: Anxiolytics/Hypnotics - Non-controlled Failed - 11/27/2019  7:29 AM      Failed - Valid encounter within last 6 months    Recent Outpatient Visits           1 year ago Chronic bilateral low back pain, with sciatica presence unspecified   [facility] Birdie Sons, MD   2 years ago [place], Donald E, MD   3 years ago Annual physical exam   [facility] Birdie Sons, MD       Future Appointments             In 4 weeks Fisher, Kirstie Peri, MD [facility]              venlafaxine XR (EFFEXOR-XR) 75 MG 24 hr capsule [Pharmacy Med Name: VENLAFAXINE ER 75MG  CAPSULES] 90 capsule 0    Sig: TAKE 3 CAPSULES BY MOUTH EVERY MORNING      Psychiatry: Antidepressants - SNRI - desvenlafaxine & venlafaxine Failed - 11/27/2019  7:29 AM      Failed - LDL in normal range and within 360 days    LDL Cholesterol (Calc)  Date Value Ref Range Status  10/13/2017 83 mg/dL (calc) Final    Comment:    Reference range: <100 . Desirable range <100 mg/dL for primary prevention;   <70 mg/dL for patients with CHD or diabetic patients  with > or = 2 CHD risk factors. Marland Kitchen LDL-C is now calculated using the Martin-Hopkins  calculation, which is a validated novel method providing  better accuracy than the Friedewald equation in the  estimation of LDL-C.  Cresenciano Genre et al. Annamaria Helling. 8101;751(02): 2061-2068   (http://education.[facility].com/faq/FAQ164)           Failed - Total Cholesterol in normal range and within 360 days    Cholesterol, Total  Date Value Ref Range Status  03/31/2016 170 100 - 199 mg/dL Final   Cholesterol  Date Value Ref Range Status  10/13/2017 160 <200 mg/dL Final          Failed - Triglycerides in normal range and within 360 days    Triglycerides  Date Value Ref Range Status  10/13/2017 153 (H) <150 mg/dL Final          Failed - Last BP in normal range    BP Readings from Last 1 Encounters:  12/02/17 (!) 144/80          Failed - Valid encounter within last 6 months    Recent Outpatient Visits           1 year ago Chronic bilateral low back pain, with sciatica presence unspecified   [facility] Birdie Sons, MD   2 years ago [place], Donald E, MD   3 years ago Annual physical exam   [facility] Birdie Sons, MD       Future  Appointments             In 4 weeks Fisher, Demetrios Isaacs, MD [facility]

## 2019-11-27 NOTE — Addendum Note (Signed)
Addended by: Janey Greaser D on: 11/27/2019 11:24 AM   Modules accepted: Orders

## 2019-11-27 NOTE — Telephone Encounter (Signed)
Pt stated he has scheduled appt for 12/25/19 and would like to know if refill can be given through appt date. Please advise.

## 2019-11-29 DIAGNOSIS — M5136 Other intervertebral disc degeneration, lumbar region: Secondary | ICD-10-CM | POA: Diagnosis not present

## 2019-12-04 MED ORDER — VENLAFAXINE HCL ER 75 MG PO CP24: 225.0000 mg | ORAL_CAPSULE | Freq: Every morning | ORAL | 1 refills | Status: DC

## 2019-12-04 NOTE — Addendum Note (Signed)
Addended by: Malva Limes on: 12/04/2019 12:48 PM   Modules accepted: Orders

## 2019-12-04 NOTE — Telephone Encounter (Signed)
Patient called in in regards to second script he has requested,venlafaxine XR (EFFEXOR-XR) 75 MG 24 hr capsule. Pt would like to know if this can be filled due to having an appointment 3/2. Please advise.

## 2019-12-07 DIAGNOSIS — J45909 Unspecified asthma, uncomplicated: Secondary | ICD-10-CM | POA: Diagnosis not present

## 2019-12-07 DIAGNOSIS — J449 Chronic obstructive pulmonary disease, unspecified: Secondary | ICD-10-CM | POA: Diagnosis not present

## 2019-12-10 DIAGNOSIS — M170 Bilateral primary osteoarthritis of knee: Secondary | ICD-10-CM | POA: Diagnosis not present

## 2019-12-17 DIAGNOSIS — M170 Bilateral primary osteoarthritis of knee: Secondary | ICD-10-CM | POA: Diagnosis not present

## 2019-12-20 ENCOUNTER — Other Ambulatory Visit: Payer: Self-pay | Admitting: Family Medicine

## 2019-12-20 DIAGNOSIS — F419 Anxiety disorder, unspecified: Secondary | ICD-10-CM

## 2019-12-20 NOTE — Telephone Encounter (Signed)
Requested  medications are  due for refill today Xanax 2mg  1 tablet 4xday  Requested medications are on the active medication list yes  Last refill 09/24/2019  Future visit scheduled 12/25/2019  Notes to clinic This rx is not delegated per protocol.

## 2019-12-24 DIAGNOSIS — M170 Bilateral primary osteoarthritis of knee: Secondary | ICD-10-CM | POA: Diagnosis not present

## 2019-12-25 ENCOUNTER — Encounter: Payer: Self-pay | Admitting: Family Medicine

## 2019-12-25 ENCOUNTER — Other Ambulatory Visit: Payer: Self-pay

## 2019-12-25 ENCOUNTER — Ambulatory Visit (INDEPENDENT_AMBULATORY_CARE_PROVIDER_SITE_OTHER): Payer: Medicare Other | Admitting: Family Medicine

## 2019-12-25 VITALS — BP 130/80 | HR 93 | Temp 97.1°F | Resp 18 | Ht 69.0 in | Wt 279.0 lb

## 2019-12-25 DIAGNOSIS — Z1211 Encounter for screening for malignant neoplasm of colon: Secondary | ICD-10-CM

## 2019-12-25 DIAGNOSIS — I700 Atherosclerosis of aorta: Secondary | ICD-10-CM

## 2019-12-25 DIAGNOSIS — J849 Interstitial pulmonary disease, unspecified: Secondary | ICD-10-CM

## 2019-12-25 DIAGNOSIS — I10 Essential (primary) hypertension: Secondary | ICD-10-CM

## 2019-12-25 DIAGNOSIS — R739 Hyperglycemia, unspecified: Secondary | ICD-10-CM

## 2019-12-25 DIAGNOSIS — E782 Mixed hyperlipidemia: Secondary | ICD-10-CM

## 2019-12-25 DIAGNOSIS — J9611 Chronic respiratory failure with hypoxia: Secondary | ICD-10-CM | POA: Diagnosis not present

## 2019-12-25 DIAGNOSIS — Z125 Encounter for screening for malignant neoplasm of prostate: Secondary | ICD-10-CM

## 2019-12-25 LAB — POCT GLYCOSYLATED HEMOGLOBIN (HGB A1C)
Est. average glucose Bld gHb Est-mCnc: 126
Hemoglobin A1C: 6 % — AB (ref 4.0–5.6)

## 2019-12-25 NOTE — Progress Notes (Signed)
Patient: Clarence Hamilton Male    DOB: 03/29/1956   64 y.o.   MRN: [id number] Visit Date: 12/25/2019  Today's Provider: Mila Merry, MD   Chief Complaint  Patient presents with  . Pre-diabetes  . Hypertension  . Hyperlipidemia   Subjective:     HPI  Follow up for Anxiety:  The patient was last seen for this 2 years ago. Changes made at last visit include none.  He reports good compliance with treatment. He feels that condition is Worse. He is not having side effects.   ------------------------------------------------------------------------------------  Follow up for Hypokalemia:  The patient was last seen for this 2 years ago. Changes made at last visit include started kdur daily.  He reports good compliance with treatment. He feels that condition is Unchanged. He is not having side effects.   ------------------------------------------------------------------------------------  Prediabetes, Follow-up:   Lab Results  Component Value Date   HGBA1C 6.0 (A) 12/25/2019   HGBA1C 6.6 (H) 10/13/2017   HGBA1C 6.1 10/07/2017   GLUCOSE 89 10/13/2017   GLUCOSE 112 (H) 03/31/2016    Last seen for for this more than 2 years ago.  Management since that visit includes advising patient to cut back on sweets/ starches and walk at least 30 minutes a day. Should also take 81mg  enteric coated aspirin a day due heart diease risk associated with diabetes. Current symptoms include none and have been stable.  Weight trend: fluctuating a bit Prior visit with dietician: no Current diet: well balanced Current exercise: none  Pertinent Labs:    Component Value Date/Time   CHOL 160 10/13/2017 1447   CHOL 170 03/31/2016 1020   TRIG 153 (H) 10/13/2017 1447   CHOLHDL 3.1 10/13/2017 1447   CREATININE 1.05 10/13/2017 1447    Wt Readings from Last 3 Encounters:  12/25/19 279 lb (126.6 kg)  12/02/17 277 lb (125.6 kg)  10/07/17 254 lb (115.2 kg)    Hypertension,  follow-up:  BP Readings from Last 3 Encounters:  12/25/19 130/80  12/02/17 (!) 144/80  10/07/17 110/68    He was last seen for hypertension 2 years ago.  BP at that visit was 110/68. Management since that visit includes none. He reports good compliance with treatment. He is not having side effects.  He is not exercising. He is adherent to low salt diet.   Outside blood pressures are not checked. He is experiencing none.  Patient denies chest pain, chest pressure/discomfort, claudication, dyspnea, exertional chest pressure/discomfort, fatigue, irregular heart beat, lower extremity edema, near-syncope, orthopnea, palpitations, paroxysmal nocturnal dyspnea, syncope and tachypnea.   Cardiovascular risk factors include advanced age (older than 62 for men, 21 for women), dyslipidemia, hypertension and male gender.  Use of agents associated with hypertension: none.     Weight trend: fluctuating a bit Wt Readings from Last 3 Encounters:  12/25/19 279 lb (126.6 kg)  12/02/17 277 lb (125.6 kg)  10/07/17 254 lb (115.2 kg)    Current diet: well balanced  ------------------------------------------------------------------------  Lipid/Cholesterol, Follow-up:   Last seen for this 2 years ago.  Management changes since that visit include none. . Last Lipid Panel:    Component Value Date/Time   CHOL 160 10/13/2017 1447   CHOL 170 03/31/2016 1020   TRIG 153 (H) 10/13/2017 1447   HDL 52 10/13/2017 1447   HDL 45 03/31/2016 1020   CHOLHDL 3.1 10/13/2017 1447   LDLCALC 83 10/13/2017 1447    Risk factors for vascular disease include hypercholesterolemia and  hypertension  He reports good compliance with treatment. He is not having side effects.  Current symptoms include none and have been stable. Weight trend: fluctuating a bit Prior visit with dietician: no Current diet: well balanced Current exercise: none  Wt Readings from Last 3 Encounters:  12/25/19 279 lb (126.6 kg)    12/02/17 277 lb (125.6 kg)  10/07/17 254 lb (115.2 kg)    -------------------------------------------------------------------  Allergies  Allergen Reactions  . Sulfa Antibiotics   . Tetracycline   . Tramadol     insomnia  . Corticosteroids     Other reaction(s): Muscle Pain     Current Outpatient Medications:  .  alprazolam (XANAX) 2 MG tablet, TAKE 1 TABLET BY MOUTH FOUR TIMES DAILY, Disp: 120 tablet, Rfl: 3 .  Azelastine-Fluticasone (DYMISTA) 137-50 MCG/ACT SUSP, Place 1 spray into both nostrils 2 (two) times daily., Disp: , Rfl:  .  busPIRone (BUSPAR) 15 MG tablet, TAKE 1/2 TABLET BY MOUTH FOUR TIMES DAILY, Disp: 60 tablet, Rfl: 0 .  cyclobenzaprine (FLEXERIL) 5 MG tablet, TAKE 1 TO 2 TABLETS(5 TO 10 MG) BY MOUTH THREE TIMES DAILY AS NEEDED FOR MUSCLE SPASMS, Disp: 60 tablet, Rfl: 3 .  diltiazem (CARDIZEM CD) 240 MG 24 hr capsule, Take 1 capsule by mouth daily., Disp: , Rfl:  .  fluticasone (FLONASE) 50 MCG/ACT nasal spray, Place 2 sprays into both nostrils every morning., Disp: , Rfl:  .  furosemide (LASIX) 20 MG tablet, Take 1 tablet by mouth daily., Disp: , Rfl:  .  losartan-hydrochlorothiazide (HYZAAR) 100-25 MG tablet, Take 1 tablet by mouth daily., Disp: , Rfl:  .  meloxicam (MOBIC) 15 MG tablet, TAKE 1 TABLET(15 MG) BY MOUTH DAILY, Disp: 30 tablet, Rfl: 5 .  montelukast (SINGULAIR) 10 MG tablet, Take 1 tablet by mouth daily., Disp: , Rfl:  .  Multiple Vitamins-Minerals (CENTRUM SILVER PO), Take 1 tablet by mouth daily., Disp: , Rfl:  .  niacin (NIASPAN) 1000 MG CR tablet, take 1 tablet by mouth once daily, Disp: 30 tablet, Rfl: 11 .  OXYGEN, Place into the nose., Disp: , Rfl:  .  potassium chloride SA (KLOR-CON) 20 MEQ tablet, TAKE 1 TABLET BY MOUTH EVERY DAY, Disp: 90 tablet, Rfl: 1 .  PROAIR HFA 108 (90 Base) MCG/ACT inhaler, , Disp: , Rfl: 10 .  simvastatin (ZOCOR) 20 MG tablet, TAKE 1 TABLET BY MOUTH EVERY NIGHT AT BEDTIME, Disp: 90 tablet, Rfl: 0 .  venlafaxine XR  (EFFEXOR-XR) 75 MG 24 hr capsule, Take 3 capsules (225 mg total) by mouth every morning., Disp: 90 capsule, Rfl: 1  Review of Systems  Constitutional: Negative for appetite change, chills and fever.  Respiratory: Negative for chest tightness, shortness of breath and wheezing.   Cardiovascular: Negative for chest pain and palpitations.  Gastrointestinal: Negative for abdominal pain, nausea and vomiting.  Psychiatric/Behavioral: The patient is nervous/anxious.     Social History   Tobacco Use  . Smoking status: Never Smoker  . Smokeless tobacco: Never Used  Substance Use Topics  . Alcohol use: No    Alcohol/week: 0.0 standard drinks      Objective:   BP 130/80 (BP Location: Left Arm, Patient Position: Sitting, Cuff Size: Large)   Pulse 93   Temp (!) 97.1 F (36.2 C) (Temporal)   Resp 18   Ht 5\' 9"  (1.753 m)   Wt 279 lb (126.6 kg)   SpO2 96% Comment: 3L 02 nasal canula  BMI 41.20 kg/m  Vitals:   12/25/19 1401  BP: 130/80  Pulse: 93  Resp: 18  Temp: (!) 97.1 F (36.2 C)  TempSrc: Temporal  SpO2: 96%  Weight: 279 lb (126.6 kg)  Height: 5\' 9"  (1.753 m)  Body mass index is 41.2 kg/m.   Physical Exam   General: Appearance:    Severely obese male in no acute distress  Eyes:    PERRL, conjunctiva/corneas clear, EOM's intact       Lungs:     Clear to auscultation bilaterally, respirations unlabored  Heart:    Normal heart rate. Normal rhythm. No murmurs, rubs, or gallops.   MS:   All extremities are intact.   Neurologic:   Awake, alert, oriented x 3. No apparent focal neurological           defect.        Results for orders placed or performed in visit on 12/25/19  POCT HgB A1C  Result Value Ref Range   Hemoglobin A1C 6.0 (A) 4.0 - 5.6 %   Est. average glucose Bld gHb Est-mCnc 126        Assessment & Plan     1. Hyperglycemia Stable, he reports he has been doing good about avoiding sugars, although he is still gaining weight.   2. Chronic respiratory  failure with hypoxia (HCC)   3. ILD (interstitial lung disease) (HCC) Continue follow up with Dr. 02/24/20.   4. Aortic atherosclerosis (HCC) Continue current statin  5. Essential hypertension Well controlled.  Continue current medications.    6. Hyperlipidemia, mixed He is tolerating simvastatin well with no adverse effects.   - Comprehensive metabolic panel - Lipid panel - CBC - TSH  7. Morbid obesity, unspecified obesity type (HCC) Exacerbated by mobility issues related to chronic back and knee pains. Encouraged to increase activity as tolerated.  - TSH  8. Colon cancer screening  - Cologuard  9. Prostate cancer screening  - PSA     Meredeth Ide, MD  [facility]

## 2019-12-25 NOTE — Patient Instructions (Signed)
.   Please review the attached list of medications and notify my office if there are any errors.    Please bring all of your medications to every appointment so we can make sure that our medication list is the same as yours.   . Please go to the lab draw station in [address] on the second floor of [facility]  when you are fasting for 8 hours. Normal hours are 8:00am to 12:30pm and 1:30pm to 4:00pm Monday through Friday     

## 2019-12-27 ENCOUNTER — Other Ambulatory Visit: Payer: Self-pay | Admitting: Family Medicine

## 2019-12-27 DIAGNOSIS — F419 Anxiety disorder, unspecified: Secondary | ICD-10-CM

## 2019-12-27 NOTE — Telephone Encounter (Signed)
Requested Prescriptions  Pending Prescriptions Disp Refills  . busPIRone (BUSPAR) 15 MG tablet [Pharmacy Med Name: BUSPIRONE 15MG  TABLETS] 180 tablet 1    Sig: TAKE 1/2 TABLET BY MOUTH FOUR TIMES DAILY     Psychiatry: Anxiolytics/Hypnotics - Non-controlled Passed - 12/27/2019  6:31 AM      Passed - Valid encounter within last 6 months    Recent Outpatient Visits          2 days ago Hyperglycemia   [facility], [facility]   2 years ago Chronic bilateral low back pain, with sciatica presence unspecified   [facility], MD   2 years ago Bronchitis   [facility], MD   3 years ago Annual physical exam   [facility], MD

## 2020-01-04 DIAGNOSIS — J449 Chronic obstructive pulmonary disease, unspecified: Secondary | ICD-10-CM | POA: Diagnosis not present

## 2020-01-04 DIAGNOSIS — J45909 Unspecified asthma, uncomplicated: Secondary | ICD-10-CM | POA: Diagnosis not present

## 2020-01-25 DIAGNOSIS — Z1211 Encounter for screening for malignant neoplasm of colon: Secondary | ICD-10-CM | POA: Diagnosis not present

## 2020-01-26 ENCOUNTER — Other Ambulatory Visit: Payer: Self-pay | Admitting: Family Medicine

## 2020-01-26 NOTE — Telephone Encounter (Signed)
Requested Prescriptions  Pending Prescriptions Disp Refills  . simvastatin (ZOCOR) 20 MG tablet [Pharmacy Med Name: SIMVASTATIN 20MG  TABLETS] 30 tablet 0    Sig: TAKE 1 TABLET BY MOUTH EVERY NIGHT AT BEDTIME     Cardiovascular:  Antilipid - Statins Failed - 01/26/2020  7:35 AM      Failed - Total Cholesterol in normal range and within 360 days    Cholesterol, Total  Date Value Ref Range Status  03/31/2016 170 100 - 199 mg/dL Final   Cholesterol  Date Value Ref Range Status  10/13/2017 160 <200 mg/dL Final         Failed - LDL in normal range and within 360 days    LDL Cholesterol (Calc)  Date Value Ref Range Status  10/13/2017 83 mg/dL (calc) Final    Comment:    Reference range: <100 . Desirable range <100 mg/dL for primary prevention;   <70 mg/dL for patients with CHD or diabetic patients  with > or = 2 CHD risk factors. Marland Kitchen LDL-C is now calculated using the Martin-Hopkins  calculation, which is a validated novel method providing  better accuracy than the Friedewald equation in the  estimation of LDL-C.  Cresenciano Genre et al. Annamaria Helling. 8469;629(52): 2061-2068  (http://education.[employer].com/faq/FAQ164)          Failed - HDL in normal range and within 360 days    HDL  Date Value Ref Range Status  10/13/2017 52 >40 mg/dL Final  03/31/2016 45 >39 mg/dL Final         Failed - Triglycerides in normal range and within 360 days    Triglycerides  Date Value Ref Range Status  10/13/2017 153 (H) <150 mg/dL Final         Passed - Patient is not pregnant      Passed - Valid encounter within last 12 months    Recent Outpatient Visits          1 month ago Hyperglycemia   [facility] Birdie Sons, MD   2 years ago Chronic bilateral low back pain, with sciatica presence unspecified   [facility] Birdie Sons, MD   2 years ago Lonerock, Donald E, MD   3 years ago Annual physical exam   [facility] Birdie Sons, MD             . venlafaxine XR (EFFEXOR-XR) 75 MG 24 hr capsule [Pharmacy Med Name: VENLAFAXINE ER 75MG  CAPSULES] 90 capsule 1    Sig: TAKE 3 CAPSULES(225 MG) BY MOUTH EVERY MORNING     Psychiatry: Antidepressants - SNRI - desvenlafaxine & venlafaxine Failed - 01/26/2020  7:35 AM      Failed - LDL in normal range and within 360 days    LDL Cholesterol (Calc)  Date Value Ref Range Status  10/13/2017 83 mg/dL (calc) Final    Comment:    Reference range: <100 . Desirable range <100 mg/dL for primary prevention;   <70 mg/dL for patients with CHD or diabetic patients  with > or = 2 CHD risk factors. Marland Kitchen LDL-C is now calculated using the Martin-Hopkins  calculation, which is a validated novel method providing  better accuracy than the Friedewald equation in the  estimation of LDL-C.  Cresenciano Genre et al. Annamaria Helling. 8413;244(01): 2061-2068  (http://education.[facility].com/faq/FAQ164)          Failed - Total Cholesterol in normal range and within 360 days    Cholesterol, Total  Date Value Ref Range Status  03/31/2016 170 100 - 199 mg/dL Final   Cholesterol  Date Value Ref Range Status  10/13/2017 160 <200 mg/dL Final         Failed - Triglycerides in normal range and within 360 days    Triglycerides  Date Value Ref Range Status  10/13/2017 153 (H) <150 mg/dL Final         Passed - Last BP in normal range    BP Readings from Last 1 Encounters:  12/25/19 130/80         Passed - Valid encounter within last 6 months    Recent Outpatient Visits          1 month ago Hyperglycemia   [facility] Malva Limes, MD   2 years ago Chronic bilateral low back pain, with sciatica presence unspecified   [facility] Malva Limes, MD   2 years ago Bronchitis   [facility] Malva Limes, MD   3 years ago Annual physical exam   [facility] Malva Limes, MD

## 2020-01-29 LAB — COLOGUARD
COLOGUARD: NEGATIVE
Cologuard: NEGATIVE

## 2020-02-04 DIAGNOSIS — J449 Chronic obstructive pulmonary disease, unspecified: Secondary | ICD-10-CM | POA: Diagnosis not present

## 2020-02-04 DIAGNOSIS — J45909 Unspecified asthma, uncomplicated: Secondary | ICD-10-CM | POA: Diagnosis not present

## 2020-02-06 ENCOUNTER — Telehealth: Payer: Self-pay

## 2020-02-06 NOTE — Telephone Encounter (Signed)
LMTCB, okay for PEC to advise patient.  

## 2020-02-06 NOTE — Telephone Encounter (Signed)
Pt given result per Dr Sherrie Mustache; he verbalized understanding; the pt would like for Dr Sherrie Mustache to know that he is aware that he has blood tests to be done; the pt says that he has been under the weather because he fell, so he is waiting for it to get easier to walk; he says Dr Sherrie Mustache is aware of his back and leg problems; will route to office for notification.

## 2020-02-06 NOTE — Telephone Encounter (Signed)
-----   Message from Malva Limes, MD sent at 02/05/2020  4:51 PM EDT ----- Cologuard is negative. Repeat every 3 years.

## 2020-02-20 DIAGNOSIS — E782 Mixed hyperlipidemia: Secondary | ICD-10-CM | POA: Diagnosis not present

## 2020-02-21 ENCOUNTER — Encounter: Payer: Self-pay | Admitting: Family Medicine

## 2020-02-21 LAB — COMPREHENSIVE METABOLIC PANEL
ALT: 25 IU/L (ref 0–44)
AST: 25 IU/L (ref 0–40)
Albumin/Globulin Ratio: 1.6 (ref 1.2–2.2)
Albumin: 4.2 g/dL (ref 3.8–4.8)
Alkaline Phosphatase: 109 IU/L (ref 39–117)
BUN/Creatinine Ratio: 11 (ref 10–24)
BUN: 13 mg/dL (ref 8–27)
Bilirubin Total: 0.3 mg/dL (ref 0.0–1.2)
CO2: 27 mmol/L (ref 20–29)
Calcium: 9.3 mg/dL (ref 8.6–10.2)
Chloride: 98 mmol/L (ref 96–106)
Creatinine, Ser: 1.14 mg/dL (ref 0.76–1.27)
GFR calc Af Amer: 79 mL/min/{1.73_m2} (ref >59)
GFR calc non Af Amer: 69 mL/min/{1.73_m2} (ref >59)
Globulin, Total: 2.6 g/dL (ref 1.5–4.5)
Glucose: 117 mg/dL — ABNORMAL HIGH (ref 65–99)
Potassium: 4.1 mmol/L (ref 3.5–5.2)
Sodium: 142 mmol/L (ref 134–144)
Total Protein: 6.8 g/dL (ref 6.0–8.5)

## 2020-02-21 LAB — CBC
Hematocrit: 39.2 % (ref 37.5–51.0)
Hemoglobin: 13.1 g/dL (ref 13.0–17.7)
MCH: 29.6 pg (ref 26.6–33.0)
MCHC: 33.4 g/dL (ref 31.5–35.7)
MCV: 89 fL (ref 79–97)
Platelets: 286 10*3/uL (ref 150–450)
RBC: 4.42 x10E6/uL (ref 4.14–5.80)
RDW: 13.5 % (ref 11.6–15.4)
WBC: 12.8 10*3/uL — ABNORMAL HIGH (ref 3.4–10.8)

## 2020-02-21 LAB — PSA: Prostate Specific Ag, Serum: 5.1 ng/mL — ABNORMAL HIGH (ref 0.0–4.0)

## 2020-02-21 LAB — LIPID PANEL
Chol/HDL Ratio: 5.3 ratio — ABNORMAL HIGH (ref 0.0–5.0)
Cholesterol, Total: 191 mg/dL (ref 100–199)
HDL: 36 mg/dL — ABNORMAL LOW (ref >39)
LDL Chol Calc (NIH): 112 mg/dL — ABNORMAL HIGH (ref 0–99)
Triglycerides: 249 mg/dL — ABNORMAL HIGH (ref 0–149)
VLDL Cholesterol Cal: 43 mg/dL — ABNORMAL HIGH (ref 5–40)

## 2020-02-21 LAB — TSH: TSH: 1.2 u[IU]/mL (ref 0.450–4.500)

## 2020-02-25 ENCOUNTER — Other Ambulatory Visit: Payer: Self-pay | Admitting: Family Medicine

## 2020-03-05 DIAGNOSIS — J449 Chronic obstructive pulmonary disease, unspecified: Secondary | ICD-10-CM | POA: Diagnosis not present

## 2020-03-05 DIAGNOSIS — J45909 Unspecified asthma, uncomplicated: Secondary | ICD-10-CM | POA: Diagnosis not present

## 2020-03-14 ENCOUNTER — Other Ambulatory Visit: Payer: Self-pay | Admitting: Family Medicine

## 2020-03-20 NOTE — Progress Notes (Signed)
Subjective:   Clarence Hamilton is a 64 y.o. male who presents for an Initial Medicare Annual Wellness Visit.  I connected with Clarence Hamilton today by telephone and verified that I am speaking with the correct person using two identifiers. Location patient: home Location provider: work Persons participating in the virtual visit: patient, provider.   I discussed the limitations, risks, security and privacy concerns of performing an evaluation and management service by telephone and the availability of in person appointments. I also discussed with the patient that there may be a patient responsible charge related to this service. The patient expressed understanding and verbally consented to this telephonic visit.    Interactive audio and video telecommunications were attempted between this provider and patient, however failed, due to patient having technical difficulties OR patient did not have access to video capability.  We continued and completed visit with audio only.  Review of Systems  N/A  Cardiac Risk Factors include: advanced age (>57men, >91 women);obesity (BMI >30kg/m2);male gender;hypertension;dyslipidemia    Objective:    Today's Vitals   There is no height or weight on file to calculate BMI.  Advanced Directives 03/25/2020  Does Patient Have a Medical Advance Directive? Yes  Type of Paramedic of Mutual;Living will  Copy of [place] in Chart? No - copy requested    Current Medications (verified) Outpatient Encounter Medications as of 03/25/2020  Medication Sig  . alprazolam (XANAX) 2 MG tablet TAKE 1 TABLET BY MOUTH FOUR TIMES DAILY  . Azelastine-Fluticasone (DYMISTA) 137-50 MCG/ACT SUSP Place 1 spray into both nostrils 2 (two) times daily.  . busPIRone (BUSPAR) 15 MG tablet TAKE 1/2 TABLET BY MOUTH FOUR TIMES DAILY  . diltiazem (CARDIZEM CD) 240 MG 24 hr capsule Take 1 capsule by mouth daily.  . furosemide (LASIX) 20 MG  tablet Take 1 tablet by mouth daily.  Marland Kitchen losartan-hydrochlorothiazide (HYZAAR) 100-25 MG tablet Take 1 tablet by mouth daily.  . meloxicam (MOBIC) 15 MG tablet TAKE 1 TABLET(15 MG) BY MOUTH DAILY  . montelukast (SINGULAIR) 10 MG tablet Take 1 tablet by mouth daily.  . Multiple Vitamins-Minerals (CENTRUM SILVER PO) Take 1 tablet by mouth daily.  . niacin (NIASPAN) 1000 MG CR tablet take 1 tablet by mouth once daily  . OXYGEN Place 3 L into the nose. At all times  . potassium chloride SA (KLOR-CON) 20 MEQ tablet TAKE 1 TABLET BY MOUTH EVERY DAY  . PROAIR HFA 108 (90 Base) MCG/ACT inhaler Inhale 1 puff into the lungs every 6 (six) hours as needed.   . simvastatin (ZOCOR) 20 MG tablet TAKE 1 TABLET BY MOUTH EVERY NIGHT AT BEDTIME  . venlafaxine XR (EFFEXOR-XR) 75 MG 24 hr capsule TAKE 3 CAPSULES(225 MG) BY MOUTH EVERY MORNING  . cyclobenzaprine (FLEXERIL) 5 MG tablet TAKE 1 TO 2 TABLETS(5 TO 10 MG) BY MOUTH THREE TIMES DAILY AS NEEDED FOR MUSCLE SPASMS  . fluticasone (FLONASE) 50 MCG/ACT nasal spray Place 2 sprays into both nostrils every morning.   No facility-administered encounter medications on file as of 03/25/2020.    Allergies (verified) Sulfa antibiotics, Tetracycline, Tramadol, and Corticosteroids   History: Past Medical History:  Diagnosis Date  . Anxiety   . Arthritis   . Asthma   . History of chicken pox   . Hyperglycemia   . Hyperlipidemia   . Hypertension   . Obesity    Past Surgical History:  Procedure Laterality Date  . HERNIA REPAIR  1970's   Inguinal  hernia repair x 2   . TONSILLECTOMY AND ADENOIDECTOMY  1960's   Family History  Problem Relation Age of Onset  . Hypertension Mother   . Breast cancer Mother   . Melanoma Father   . Hypertension Father   . Parkinson's disease Father   . Throat cancer Paternal Grandmother    Social History   Socioeconomic History  . Marital status: Single    Spouse name: Not on file  . Number of children: 0  . Years of  education: Not on file  . Highest education level: Bachelor's degree (e.g., BA, AB, BS)  Occupational History  . Occupation: Retired  Tobacco Use  . Smoking status: Never Smoker  . Smokeless tobacco: Never Used  Substance and Sexual Activity  . Alcohol use: No    Alcohol/week: 0.0 standard drinks  . Drug use: No  . Sexual activity: Not on file  Other Topics Concern  . Not on file  Social History Narrative  . Not on file   Social Determinants of Health   Financial Resource Strain: Low Risk   . Difficulty of Paying Living Expenses: Not hard at all  Food Insecurity: No Food Insecurity  . Worried About Programme researcher, broadcasting/film/video in the Last Year: Never true  . Ran Out of Food in the Last Year: Never true  Transportation Needs: No Transportation Needs  . Lack of Transportation (Medical): No  . Lack of Transportation (Non-Medical): No  Physical Activity: Inactive  . Days of Exercise per Week: 0 days  . Minutes of Exercise per Session: 0 min  Stress: No Stress Concern Present  . Feeling of Stress : Only a little  Social Connections: Moderately Isolated  . Frequency of Communication with Friends and Family: More than three times a week  . Frequency of Social Gatherings with Friends and Family: More than three times a week  . Attends Religious Services: Never  . Active Member of Clubs or Organizations: No  . Attends Banker Meetings: Never  . Marital Status: Never married   Tobacco Counseling Counseling given: Not Answered   Clinical Intake:  Pre-visit preparation completed: Yes  Pain : No/denies pain(Has chronic pain in back and both knees when moving around.)     Nutritional Risks: None Diabetes: No(Prediabetic)  How often do you need to have someone help you when you read instructions, pamphlets, or other written materials from your doctor or pharmacy?: 1 - Never  Interpreter Needed?: No  Information entered by :: [facility], LPN  Activities of Daily  Living In your present state of health, do you have any difficulty performing the following activities: 03/25/2020 12/25/2019  Hearing? N N  Vision? N N  Difficulty concentrating or making decisions? N N  Walking or climbing stairs? Y Y  Comment Due to back and knee pains. -  Dressing or bathing? N N  Doing errands, shopping? N N  Preparing Food and eating ? N -  Using the Toilet? N -  In the past six months, have you accidently leaked urine? N -  Do you have problems with loss of bowel control? N -  Managing your Medications? N -  Managing your Finances? N -  Housekeeping or managing your Housekeeping? N -  Some recent data might be hidden     Immunizations and Health Maintenance Immunization History  Administered Date(s) Administered  . H1N1 08/29/2008  . Influenza Split 10/03/2014  . Influenza-Unspecified 09/07/2017, 07/25/2018, 07/31/2019  . Tdap 02/17/2016   Health  Maintenance Due  Topic Date Due  . COVID-19 Vaccine (1) Never done  . HIV Screening  Never done    Patient Care Team: Malva Limes, MD as PCP - General (Family Medicine) Mertie Moores, MD as Referring Physician (Specialist) Pa, [facility] (Optometry) Sheran Luz, MD as Consulting Physician (Physical Medicine and Rehabilitation) Ranee Gosselin, MD as Consulting Physician (Orthopedic Surgery) Su Hoff, PA-C as Physician Assistant (Chiropractic Medicine)  Indicate any recent Medical Services you may have received from other than [facility] providers in the past year (date may be approximate).    Assessment:   This is a routine wellness examination for Clarence Hamilton.  Hearing/Vision screen No exam data present  Dietary issues and exercise activities discussed: Current Exercise Habits: The patient does not participate in regular exercise at present, Exercise limited by: orthopedic condition(s)  Goals    . LIFESTYLE - DECREASE FALLS RISK     Recommend to remove any items from the home that may  cause slips or trips.      Depression Screen PHQ 2/9 Scores 12/25/2019 12/02/2017 10/07/2017 02/17/2016  PHQ - 2 Score 1 0 - 0  PHQ- 9 Score - - - 3  Exception Documentation - - Patient refusal -    Fall Risk Fall Risk  03/25/2020 12/25/2019  Falls in the past year? 1 1  Number falls in past yr: 0 1  Injury with Fall? 0 1  Follow up Falls prevention discussed Falls evaluation completed;Falls prevention discussed    FALL RISK PREVENTION PERTAINING TO THE HOME:  Any stairs in or around the home? Yes  If so, are there any without handrails? No   Home free of loose throw rugs in walkways, pet beds, electrical cords, etc? Yes  Adequate lighting in your home to reduce risk of falls? Yes   ASSISTIVE DEVICES UTILIZED TO PREVENT FALLS:  Life alert? No  Use of a cane, walker or w/c? No  Grab bars in the bathroom? Yes  Shower chair or bench in shower? Yes  Elevated toilet seat or a handicapped toilet? Yes    TIMED UP AND GO:  Was the test performed? No .    Cognitive Function: Declined today.        Screening Tests Health Maintenance  Topic Date Due  . COVID-19 Vaccine (1) Never done  . HIV Screening  Never done  . INFLUENZA VACCINE  05/25/2020  . Fecal DNA (Cologuard)  01/25/2023  . TETANUS/TDAP  02/16/2026  . Hepatitis C Screening  Completed    Qualifies for Shingles Vaccine? Yes . Due for Shingrix. Pt has been advised to call insurance company to determine out of pocket expense. Advised may also receive vaccine at local pharmacy or Health Dept. Verbalized acceptance and understanding.  Tdap: Up to date  Flu Vaccine: Up to date   Cancer Screenings:  Colorectal Screening: Cologuard completed 01/25/20. Repeat every 3 years.   Lung Cancer Screening: (Low Dose CT Chest recommended if Age 62-80 years, 30 pack-year currently smoking OR have quit w/in 15years.) does not qualify.   Additional Screening:  Hepatitis C Screening: Up to date  Vision Screening: Recommended  annual ophthalmology exams for early detection of glaucoma and other disorders of the eye.  Dental Screening: Recommended annual dental exams for proper oral hygiene  Community Resource Referral:  CRR required this visit? No      Plan:  I have personally reviewed and addressed the Medicare Annual Wellness questionnaire and have noted the following in the  patient's chart:  A. Medical and social history B. Use of alcohol, tobacco or illicit drugs  C. Current medications and supplements D. Functional ability and status E.  Nutritional status F.  Physical activity G. Advance directives H. List of other physicians I.  Hospitalizations, surgeries, and ER visits in previous 12 months J.  Vitals K. Screenings such as hearing and vision if needed, cognitive and depression L. Referrals and appointments   In addition, I have reviewed and discussed with patient certain preventive protocols, quality metrics, and best practice recommendations. A written personalized care plan for preventive services as well as general preventive health recommendations were provided to patient.   Darrick Huntsman, [place]   3/0/1601  Nurse Health Advisor   Nurse Notes: Pt would like to have the HIV lab order added to next in office apt. Referral sent to [facility] team for assistance with financial concerns regarding cost for Dymista.

## 2020-03-25 ENCOUNTER — Other Ambulatory Visit: Payer: Self-pay

## 2020-03-25 ENCOUNTER — Ambulatory Visit (INDEPENDENT_AMBULATORY_CARE_PROVIDER_SITE_OTHER): Payer: Medicare Other

## 2020-03-25 DIAGNOSIS — Z0000 Encounter for general adult medical examination without abnormal findings: Secondary | ICD-10-CM

## 2020-03-25 DIAGNOSIS — Z598 Other problems related to housing and economic circumstances: Secondary | ICD-10-CM | POA: Diagnosis not present

## 2020-03-25 DIAGNOSIS — Z599 Problem related to housing and economic circumstances, unspecified: Secondary | ICD-10-CM

## 2020-03-25 NOTE — Addendum Note (Signed)
Addended by: Hyacinth Meeker A on: 03/25/2020 03:57 PM   Modules accepted: Orders

## 2020-03-25 NOTE — Patient Instructions (Signed)
Clarence Hamilton , Thank you for taking time to come for your Medicare Wellness Visit. I appreciate your ongoing commitment to your health goals. Please review the following plan we discussed and let me know if I can assist you in the future.   Screening recommendations/referrals: Colonoscopy: Cologuard up to date, due 01/25/23 Recommended yearly ophthalmology/optometry visit for glaucoma screening and checkup Recommended yearly dental visit for hygiene and checkup  Vaccinations: Influenza vaccine: Up to date Tdap vaccine: Up to date, due 01/2026 Shingles vaccine: Pt declines today.     Advanced directives: Please bring a copy of your POA (Power of Attorney) and/or Living Will to your next appointment.   Conditions/risks identified: Fall risk prevention discussed today.  Next appointment: 08/19/20 @ 4:00 PM with Dr Caryn Section   Preventive Care 40-64 Years, Male Preventive care refers to lifestyle choices and visits with your health care provider that can promote health and wellness. What does preventive care include?  A yearly physical exam. This is also called an annual well check.  Dental exams once or twice a year.  Routine eye exams. Ask your health care provider how often you should have your eyes checked.  Personal lifestyle choices, including:  Daily care of your teeth and gums.  Regular physical activity.  Eating a healthy diet.  Avoiding tobacco and drug use.  Limiting alcohol use.  Practicing safe sex.  Taking low-dose aspirin every day starting at age 11. What happens during an annual well check? The services and screenings done by your health care provider during your annual well check will depend on your age, overall health, lifestyle risk factors, and family history of disease. Counseling  Your health care provider may ask you questions about your:  Alcohol use.  Tobacco use.  Drug use.  Emotional well-being.  Home and relationship well-being.  Sexual  activity.  Eating habits.  Work and work Statistician. Screening  You may have the following tests or measurements:  Height, weight, and BMI.  Blood pressure.  Lipid and cholesterol levels. These may be checked every 5 years, or more frequently if you are over 38 years old.  Skin check.  Lung cancer screening. You may have this screening every year starting at age 67 if you have a 30-pack-year history of smoking and currently smoke or have quit within the past 15 years.  Fecal occult blood test (FOBT) of the stool. You may have this test every year starting at age 45.  Flexible sigmoidoscopy or colonoscopy. You may have a sigmoidoscopy every 5 years or a colonoscopy every 10 years starting at age 82.  Prostate cancer screening. Recommendations will vary depending on your family history and other risks.  Hepatitis C blood test.  Hepatitis B blood test.  Sexually transmitted disease (STD) testing.  Diabetes screening. This is done by checking your blood sugar (glucose) after you have not eaten for a while (fasting). You may have this done every 1-3 years. Discuss your test results, treatment options, and if necessary, the need for more tests with your health care provider. Vaccines  Your health care provider may recommend certain vaccines, such as:  Influenza vaccine. This is recommended every year.  Tetanus, diphtheria, and acellular pertussis (Tdap, Td) vaccine. You may need a Td booster every 10 years.  Zoster vaccine. You may need this after age 23.  Pneumococcal 13-valent conjugate (PCV13) vaccine. You may need this if you have certain conditions and have not been vaccinated.  Pneumococcal polysaccharide (PPSV23) vaccine. You may need  one or two doses if you smoke cigarettes or if you have certain conditions. Talk to your health care provider about which screenings and vaccines you need and how often you need them. This information is not intended to replace advice  given to you by your health care provider. Make sure you discuss any questions you have with your health care provider. Document Released: 11/07/2015 Document Revised: 06/30/2016 Document Reviewed: 08/12/2015 Elsevier Interactive Patient Education  2017 [employer].  Fall Prevention in the Home Falls can cause injuries. They can happen to people of all ages. There are many things you can do to make your home safe and to help prevent falls. What can I do on the outside of my home?  Regularly fix the edges of walkways and driveways and fix any cracks.  Remove anything that might make you trip as you walk through a door, such as a raised step or threshold.  Trim any bushes or trees on the path to your home.  Use bright outdoor lighting.  Clear any walking paths of anything that might make someone trip, such as rocks or tools.  Regularly check to see if handrails are loose or broken. Make sure that both sides of any steps have handrails.  Any raised decks and porches should have guardrails on the edges.  Have any leaves, snow, or ice cleared regularly.  Use sand or salt on walking paths during winter.  Clean up any spills in your garage right away. This includes oil or grease spills. What can I do in the bathroom?  Use night lights.  Install grab bars by the toilet and in the tub and shower. Do not use towel bars as grab bars.  Use non-skid mats or decals in the tub or shower.  If you need to sit down in the shower, use a plastic, non-slip stool.  Keep the floor dry. Clean up any water that spills on the floor as soon as it happens.  Remove soap buildup in the tub or shower regularly.  Attach bath mats securely with double-sided non-slip rug tape.  Do not have throw rugs and other things on the floor that can make you trip. What can I do in the bedroom?  Use night lights.  Make sure that you have a light by your bed that is easy to reach.  Do not use any sheets or  blankets that are too big for your bed. They should not hang down onto the floor.  Have a firm chair that has side arms. You can use this for support while you get dressed.  Do not have throw rugs and other things on the floor that can make you trip. What can I do in the kitchen?  Clean up any spills right away.  Avoid walking on wet floors.  Keep items that you use a lot in easy-to-reach places.  If you need to reach something above you, use a strong step stool that has a grab bar.  Keep electrical cords out of the way.  Do not use floor polish or wax that makes floors slippery. If you must use wax, use non-skid floor wax.  Do not have throw rugs and other things on the floor that can make you trip. What can I do with my stairs?  Do not leave any items on the stairs.  Make sure that there are handrails on both sides of the stairs and use them. Fix handrails that are broken or loose. Make sure that  handrails are as long as the stairways.  Check any carpeting to make sure that it is firmly attached to the stairs. Fix any carpet that is loose or worn.  Avoid having throw rugs at the top or bottom of the stairs. If you do have throw rugs, attach them to the floor with carpet tape.  Make sure that you have a light switch at the top of the stairs and the bottom of the stairs. If you do not have them, ask someone to add them for you. What else can I do to help prevent falls?  Wear shoes that:  Do not have high heels.  Have rubber bottoms.  Are comfortable and fit you well.  Are closed at the toe. Do not wear sandals.  If you use a stepladder:  Make sure that it is fully opened. Do not climb a closed stepladder.  Make sure that both sides of the stepladder are locked into place.  Ask someone to hold it for you, if possible.  Clearly mark and make sure that you can see:  Any grab bars or handrails.  First and last steps.  Where the edge of each step is.  Use tools  that help you move around (mobility aids) if they are needed. These include:  Canes.  Walkers.  Scooters.  Crutches.  Turn on the lights when you go into a dark area. Replace any light bulbs as soon as they burn out.  Set up your furniture so you have a clear path. Avoid moving your furniture around.  If any of your floors are uneven, fix them.  If there are any pets around you, be aware of where they are.  Review your medicines with your doctor. Some medicines can make you feel dizzy. This can increase your chance of falling. Ask your doctor what other things that you can do to help prevent falls. This information is not intended to replace advice given to you by your health care provider. Make sure you discuss any questions you have with your health care provider. Document Released: 08/07/2009 Document Revised: 03/18/2016 Document Reviewed: 11/15/2014 Elsevier Interactive Patient Education  2017 [employer].

## 2020-03-26 ENCOUNTER — Other Ambulatory Visit: Payer: Self-pay | Admitting: Family Medicine

## 2020-03-26 NOTE — Telephone Encounter (Signed)
Requested Prescriptions  Pending Prescriptions Disp Refills   venlafaxine XR (EFFEXOR-XR) 75 MG 24 hr capsule [Pharmacy Med Name: VENLAFAXINE ER 75MG  CAPSULES] 90 capsule 1    Sig: TAKE 3 CAPSULES(225 MG) BY MOUTH EVERY MORNING     Psychiatry: Antidepressants - SNRI - desvenlafaxine & venlafaxine Failed - 03/26/2020  6:32 AM      Failed - LDL in normal range and within 360 days    LDL Cholesterol (Calc)  Date Value Ref Range Status  10/13/2017 83 mg/dL (calc) Final    Comment:    Reference range: <100 . Desirable range <100 mg/dL for primary prevention;   <70 mg/dL for patients with CHD or diabetic patients  with > or = 2 CHD risk factors. 10/15/2017 LDL-C is now calculated using the Martin-Hopkins  calculation, which is a validated novel method providing  better accuracy than the Friedewald equation in the  estimation of LDL-C.  Marland Kitchen et al. Horald Pollen. Lenox Ahr): 2061-2068  (http://education.[facility].com/faq/FAQ164)    LDL Chol Calc (NIH)  Date Value Ref Range Status  02/20/2020 112 (H) 0 - 99 mg/dL Final         Failed - Triglycerides in normal range and within 360 days    Triglycerides  Date Value Ref Range Status  02/20/2020 249 (H) 0 - 149 mg/dL Final         Passed - Total Cholesterol in normal range and within 360 days    Cholesterol, Total  Date Value Ref Range Status  02/20/2020 191 100 - 199 mg/dL Final         Passed - Last BP in normal range    BP Readings from Last 1 Encounters:  12/25/19 130/80         Passed - Valid encounter within last 6 months    Recent Outpatient Visits          3 months ago Hyperglycemia   [facility], [place]   2 years ago Chronic bilateral low back pain, with sciatica presence unspecified   [facility], [place]   2 years ago Bronchitis   [facility], [place]   4 years ago Annual physical exam   [facility], MD       Future Appointments            In 4 months Fisher, Malva Limes, MD [facility] [place]            simvastatin (ZOCOR) 20 MG tablet [Pharmacy Med Name: SIMVASTATIN 20MG  TABLETS] 30 tablet 0    Sig: TAKE 1 TABLET BY MOUTH EVERY NIGHT AT BEDTIME     Cardiovascular:  Antilipid - Statins Failed - 03/26/2020  6:32 AM      Failed - LDL in normal range and within 360 days    LDL Cholesterol (Calc)  Date Value Ref Range Status  10/13/2017 83 mg/dL (calc) Final    Comment:    Reference range: <100 . Desirable range <100 mg/dL for primary prevention;   <70 mg/dL for patients with CHD or diabetic patients  with > or = 2 CHD risk factors. 05/26/2020 LDL-C is now calculated using the Martin-Hopkins  calculation, which is a validated novel method providing  better accuracy than the Friedewald equation in the  estimation of LDL-C.  10/15/2017 et al. Marland Kitchen. Horald Pollen): 2061-2068  (http://education.[facility].com/faq/FAQ164)    LDL Chol Calc (NIH)  Date Value Ref Range Status  02/20/2020  112 (H) 0 - 99 mg/dL Final         Failed - HDL in normal range and within 360 days    HDL  Date Value Ref Range Status  02/20/2020 36 (L) >39 mg/dL Final         Failed - Triglycerides in normal range and within 360 days    Triglycerides  Date Value Ref Range Status  02/20/2020 249 (H) 0 - 149 mg/dL Final         Passed - Total Cholesterol in normal range and within 360 days    Cholesterol, Total  Date Value Ref Range Status  02/20/2020 191 100 - 199 mg/dL Final         Passed - Patient is not pregnant      Passed - Valid encounter within last 12 months    Recent Outpatient Visits          3 months ago Hyperglycemia   [facility] Birdie Sons, MD   2 years ago Chronic bilateral low back pain, with sciatica presence unspecified   [facility] Birdie Sons, MD   2 years ago [place], Donald E, MD   4  years ago Annual physical exam   [facility] Birdie Sons, MD      Future Appointments            In 4 months Fisher, Kirstie Peri, MD [facility], [place]

## 2020-04-05 DIAGNOSIS — J449 Chronic obstructive pulmonary disease, unspecified: Secondary | ICD-10-CM | POA: Diagnosis not present

## 2020-04-05 DIAGNOSIS — J45909 Unspecified asthma, uncomplicated: Secondary | ICD-10-CM | POA: Diagnosis not present

## 2020-04-16 ENCOUNTER — Other Ambulatory Visit: Payer: Self-pay | Admitting: Family Medicine

## 2020-04-16 DIAGNOSIS — F419 Anxiety disorder, unspecified: Secondary | ICD-10-CM

## 2020-04-16 NOTE — Telephone Encounter (Signed)
Requested  medications are  due for refill today yes  Requested medications are on the active medication list yes  Last refill 03/14/20  Future visit scheduled yes in July  Notes to clinic Not Delegated

## 2020-04-25 ENCOUNTER — Other Ambulatory Visit: Payer: Self-pay | Admitting: Family Medicine

## 2020-05-05 ENCOUNTER — Telehealth: Payer: Medicare Other

## 2020-05-05 ENCOUNTER — Telehealth: Payer: Self-pay | Admitting: Family Medicine

## 2020-05-05 DIAGNOSIS — J449 Chronic obstructive pulmonary disease, unspecified: Secondary | ICD-10-CM | POA: Diagnosis not present

## 2020-05-05 DIAGNOSIS — J45909 Unspecified asthma, uncomplicated: Secondary | ICD-10-CM | POA: Diagnosis not present

## 2020-05-05 NOTE — Chronic Care Management (AMB) (Signed)
  Chronic Care Management   Note  05/05/2020 Name: Clarence Hamilton MRN: [id number] DOB: [birth year]  Clarence Hamilton is a 64 y.o. year old male who is a primary care patient of Fisher, Kirstie Peri, MD. I reached out to Clarence Hamilton by phone today in response to a referral sent by Clarence Hamilton's PCP, Birdie Sons, MD.     Clarence Hamilton was given information about Chronic Care Management services today including:  1. CCM service includes personalized support from designated clinical staff supervised by his physician, including individualized plan of care and coordination with other care providers 2. 24/7 contact phone numbers for assistance for urgent and routine care needs. 3. Service will only be billed when office clinical staff spend 20 minutes or more in a month to coordinate care. 4. Only one practitioner may furnish and bill the service in a calendar month. 5. The patient may stop CCM services at any time (effective at the end of the month) by phone call to the office staff. 6. The patient will be responsible for cost sharing (co-pay) of up to 20% of the service fee (after annual deductible is met).  Patient agreed to services and verbal consent obtained.   Follow up plan: Telephone appointment with care management team member scheduled for:06/09/2020  [place], [place] [facility]  [place], [place] [address] Direct Dial: [email or web address] Website: [email or web address]

## 2020-05-05 NOTE — Chronic Care Management (AMB) (Signed)
  Chronic Care Management   Note  05/05/2020 Name: Clarence Hamilton MRN: [id number] DOB: November 11, 1955  Clarence Hamilton is a 64 y.o. year old male who is a primary care patient of Sherrie Mustache, Demetrios Isaacs, MD and is actively engaged with the care management team. I reached out to Clarence Ren Greenfeld by phone today to assist with re-scheduling an initial visit with the Pharmacist  Follow up plan: Unsuccessful telephone outreach attempt made. A HIPPA compliant phone message was left for the patient providing contact information and requesting a return call. The care management team will reach out to the patient again over the next 7 days. If patient returns call to provider office, please advise to call Embedded Care Management Care Guide Gwenevere Ghazi at [phone].  Gwenevere Ghazi  Care Guide, Embedded Care Coordination [facility]  [place], [place] [address] Direct Dial: [phone] [email or web address] Website: [email or web address]

## 2020-05-08 ENCOUNTER — Ambulatory Visit: Payer: Self-pay | Admitting: *Deleted

## 2020-05-08 DIAGNOSIS — L03116 Cellulitis of left lower limb: Secondary | ICD-10-CM | POA: Diagnosis not present

## 2020-05-08 NOTE — Telephone Encounter (Signed)
Patient states he has cellulitis in his leg and it is more progressive than before. Patient states he has been treating open wound on his left leg and ankle- he has noticed today that the area is red and is getting larger. Call to office to see if patient could be seen- they have no open appointments- advised patient UC- he states he will go.  Reason for Disposition . [1] Looks infected (spreading redness, pus) AND [2] large red area (> 2 in. or 5 cm)  Answer Assessment - Initial Assessment Questions 1. APPEARANCE of SORES: "What do the sores look like?"     2 areas of open sores 2. NUMBER: "How many sores are there?"     2 3. SIZE: "How big is the largest sore?"     Moving up and around the ankle 4. LOCATION: "Where are the sores located?"     Ankle and Left side of leg 5. ONSET: "When did the sores begin?"     1 week- no progressing 6. CAUSE: "What do you think is causing the sores?"     cellulitis 7. OTHER SYMPTOMS: "Do you have any other symptoms?" (e.g., fever, new weakness)     Raw wound and red area  Protocols used: SORES-A-AH

## 2020-05-09 ENCOUNTER — Telehealth: Payer: Self-pay

## 2020-05-09 NOTE — Telephone Encounter (Signed)
I can seem at 4pm Monday. Rest of week is booked.

## 2020-05-09 NOTE — Telephone Encounter (Signed)
Copied from CRM [id number]. Topic: Appointment Scheduling - Scheduling Inquiry for Clinic >> May 09, 2020  2:33 PM Leafy Ro wrote: Reason for CRM:pt is calling and would like afternoon appt with dr Sherrie Mustache next week. Pt went to  urgent care on [address]  on 05/08/2020 and is being treated for cellulitis on left leg ankle. Pt is on abx and cream now.

## 2020-05-09 NOTE — Telephone Encounter (Signed)
Patient scheduled for 05/12/20 at 4 pm.

## 2020-05-12 ENCOUNTER — Encounter: Payer: Self-pay | Admitting: Family Medicine

## 2020-05-12 ENCOUNTER — Other Ambulatory Visit: Payer: Self-pay

## 2020-05-12 ENCOUNTER — Ambulatory Visit (INDEPENDENT_AMBULATORY_CARE_PROVIDER_SITE_OTHER): Payer: Medicare Other | Admitting: Family Medicine

## 2020-05-12 VITALS — BP 130/70 | HR 74 | Temp 97.7°F | Resp 20 | Wt 275.0 lb

## 2020-05-12 DIAGNOSIS — L03116 Cellulitis of left lower limb: Secondary | ICD-10-CM

## 2020-05-12 DIAGNOSIS — L309 Dermatitis, unspecified: Secondary | ICD-10-CM | POA: Diagnosis not present

## 2020-05-12 MED ORDER — MUPIROCIN 2 % EX OINT: 1.0000 "application " | TOPICAL_OINTMENT | Freq: Every day | CUTANEOUS | 1 refills | Status: DC

## 2020-05-12 NOTE — Progress Notes (Signed)
I,Roshena L Chambers,acting as a scribe for Mila Merry, MD.,have documented all relevant documentation on the behalf of Mila Merry, MD,as directed by  Mila Merry, MD while in the presence of Mila Merry, MD.   Established patient visit   Patient: Clarence Hamilton   DOB: 07-Mar-1956   64 y.o. Male  MRN: [id number] Visit Date: 05/12/2020  Today's healthcare provider: Mila Merry, MD   Chief Complaint  Patient presents with  . Cellulitis   Subjective    HPI  Cellulitis of the left ankle:  The patient was last seen for this 05/08/2020 at [facility] clinic urgent care.   Treatment during that visit includes prescribing a 10 day course of Clindamycin antibiotic 300mg  three times daily, and mupirocin topical antibiotic ointment to be applied daily.    He reports good compliance with treatment. He feels that condition is slightly Improved. He is having side effects. Has had diarrhea since starting medications.  He has had infection same area of left ankle two times prior to this. The last was in April 2016 and was seen by Dr. May 2016 (ID). Was felt to be secondary to chronic venous stasis, and patient does wear compression stocking on both legs most of the time. He was also prescribed triamcinolone cream at that time for hyperkeratosis around area of infection. Patient also states Dr. Sampson Goon told him he may need chronic prophylactic antibiotic.  -----------------------------------------------------------------------------------------      Medications: Outpatient Medications Prior to Visit  Medication Sig  . alprazolam (XANAX) 2 MG tablet TAKE 1 TABLET BY MOUTH FOUR TIMES DAILY  . Azelastine-Fluticasone (DYMISTA) 137-50 MCG/ACT SUSP Place 1 spray into both nostrils 2 (two) times daily.  . busPIRone (BUSPAR) 15 MG tablet TAKE 1/2 TABLET BY MOUTH FOUR TIMES DAILY  . clindamycin (CLEOCIN) 300 MG capsule Take 300 mg by mouth 3 (three) times daily.  .  cyclobenzaprine (FLEXERIL) 5 MG tablet TAKE 1 TO 2 TABLETS(5 TO 10 MG) BY MOUTH THREE TIMES DAILY AS NEEDED FOR MUSCLE SPASMS  . diltiazem (CARDIZEM CD) 240 MG 24 hr capsule Take 1 capsule by mouth daily.  . fluticasone (FLONASE) 50 MCG/ACT nasal spray Place 2 sprays into both nostrils every morning.  . furosemide (LASIX) 20 MG tablet Take 1 tablet by mouth daily.  Sampson Goon HYDROcodone-acetaminophen (NORCO) 10-325 MG tablet Take 1 tablet by mouth 3 (three) times daily as needed.  Marland Kitchen losartan-hydrochlorothiazide (HYZAAR) 100-25 MG tablet Take 1 tablet by mouth daily.  . meloxicam (MOBIC) 15 MG tablet TAKE 1 TABLET(15 MG) BY MOUTH DAILY  . montelukast (SINGULAIR) 10 MG tablet Take 1 tablet by mouth daily.  . Multiple Vitamins-Minerals (CENTRUM SILVER PO) Take 1 tablet by mouth daily.  . mupirocin ointment (BACTROBAN) 2 % Apply 1 application topically daily.  . niacin (NIASPAN) 1000 MG CR tablet take 1 tablet by mouth once daily  . OXYGEN Place 3 L into the nose. At all times  . potassium chloride SA (KLOR-CON) 20 MEQ tablet TAKE 1 TABLET BY MOUTH EVERY DAY  . PROAIR HFA 108 (90 Base) MCG/ACT inhaler Inhale 1 puff into the lungs every 6 (six) hours as needed.   . simvastatin (ZOCOR) 20 MG tablet TAKE 1 TABLET BY MOUTH EVERY NIGHT AT BEDTIME  . venlafaxine XR (EFFEXOR-XR) 75 MG 24 hr capsule TAKE 3 CAPSULES(225 MG) BY MOUTH EVERY MORNING   No facility-administered medications prior to visit.    Review of Systems  Constitutional: Negative for appetite change, chills and fever.  Respiratory: Negative  for chest tightness, shortness of breath and wheezing.   Cardiovascular: Positive for leg swelling. Negative for chest pain and palpitations.  Gastrointestinal: Positive for diarrhea. Negative for abdominal pain, nausea and vomiting.  Musculoskeletal: Positive for myalgias (moderate buring pain on left lower leg).  Skin: Positive for color change (redness of left lower leg).       Itching of left lower leg        Objective    BP 130/70 (BP Location: Right Arm)   Pulse 74   Temp 97.7 F (36.5 C) (Temporal)   Resp 20   Wt 275 lb (124.7 kg)   SpO2 98% Comment: 3L 02 nasal canula  BMI 40.61 kg/m    Physical Exam  Mild erythema anterior left ankle greatly improved from photo patient took when sx started. Few small areas of denuded skin. Surrounding skin is dry and scaly.  Strong pedal pulses bilaterally.  3+ bipedal edema.   No results found for any visits on 05/12/20.  Assessment & Plan     1. Cellulitis of left lower extremity Third episode in same area of left ankle, last episode was 5 years ago. Likely related to underlying venous statis and fragility of skin in area due to eczema.  Appears to be significantly improved when compared to photo. Is to finish 10 day course of clindamycin. He states diarrhea is mild and tolerable. Suggest he start some pro-biotics. Will do telephone follow up later this week to see if he needs any more antibiotic. He states Dr. Sampson Goon told him he may need low potency prophylactic antibiotic for recurrent infections.   2. Eczema of lower leg    No follow-ups on file.      The entirety of the information documented in the History of Present Illness, Review of Systems and Physical Exam were personally obtained by me. Portions of this information were initially documented by the CMA and reviewed by me for thoroughness and accuracy.      Mila Merry, MD  [facility] [phone] (phone) [phone] (fax)  [facility]

## 2020-05-12 NOTE — Patient Instructions (Signed)
.   Please review the attached list of medications and notify my office if there are any errors.   . You can take an OTC pro-biotic such as Librarian, academic

## 2020-05-16 ENCOUNTER — Telehealth: Payer: Self-pay | Admitting: Family Medicine

## 2020-05-16 DIAGNOSIS — L03116 Cellulitis of left lower limb: Secondary | ICD-10-CM

## 2020-05-16 MED ORDER — CLINDAMYCIN HCL 300 MG PO CAPS: 300.0000 mg | ORAL_CAPSULE | Freq: Three times a day (TID) | ORAL | 0 refills | Status: AC

## 2020-05-16 NOTE — Telephone Encounter (Signed)
If his leg looks worse on his 10 day course of clindamycin then it would not be appropriate to send more clindamycin. He will need to be re-evaluated.

## 2020-05-16 NOTE — Telephone Encounter (Signed)
Medication: clindamycin (CLEOCIN) 300 MG capsule [[id number]] - Patient is calling to report that he lost the medication - he did not finish the dosage before  he lost the medication  Has the patient contacted their pharmacy? Yes  (Agent: If no, request that the patient contact the pharmacy for the refill.) (Agent: If yes, when and what did the pharmacy advise?)  Preferred Pharmacy (with phone number or street name):[id number] - [place], [place] - [address] AT [facility] [address]  Phone:  [phone] Fax:  [phone]     Agent: Please be advised that RX refills may take up to 3 business days. We ask that you follow-up with your pharmacy.

## 2020-05-16 NOTE — Telephone Encounter (Signed)
Pt calling again regarding this medication. He states that the spot on his leg is getting worse and is requesting to have this sent over today. Please advise.

## 2020-05-16 NOTE — Addendum Note (Signed)
Addended by: Trey Sailors on: 05/16/2020 03:37 PM   Modules accepted: Orders

## 2020-05-16 NOTE — Telephone Encounter (Signed)
I reread the part about losing it. I've sent 3 more days. Follow up with Dr. Sherrie Mustache.

## 2020-05-19 ENCOUNTER — Telehealth: Payer: Self-pay | Admitting: Family Medicine

## 2020-05-19 DIAGNOSIS — L03116 Cellulitis of left lower limb: Secondary | ICD-10-CM

## 2020-05-19 MED ORDER — MUPIROCIN 2 % EX OINT: 1.0000 "application " | TOPICAL_OINTMENT | Freq: Every day | CUTANEOUS | 1 refills | Status: DC

## 2020-05-19 NOTE — Telephone Encounter (Signed)
Patient called to ask the nurse or doctor to call him regarding his medication, meloxicam (MOBIC) 15 MG tablet.  He wants to know if he should continue taking this medication.  Please advise and call patient to discuss at [phone]

## 2020-05-19 NOTE — Telephone Encounter (Signed)
He can take it as needed if it helps with pain.

## 2020-05-19 NOTE — Telephone Encounter (Signed)
Pt advised.  He also needs a refill on bactroban 2%, please send to [facility].   Thanks,   -Vernona Rieger

## 2020-05-20 ENCOUNTER — Telehealth: Payer: Self-pay | Admitting: Family Medicine

## 2020-05-20 DIAGNOSIS — L03116 Cellulitis of left lower limb: Secondary | ICD-10-CM

## 2020-05-20 NOTE — Telephone Encounter (Signed)
Medication Refill - Medication:the patient asked for a refill on his second antibiotic that was a 90 day supply. He stated that he dropped the medication and was unable to bend down and find it due to the cellulitis. The only current antibiotic I see is Clindamycin and it is 300mg . Please advise  Has the patient contacted their pharmacy? Yes.   (Agent: If no, request that the patient contact the pharmacy for the refill.) (Agent: If yes, when and what did the pharmacy advise?)  Preferred Pharmacy (with phone number or street name): [id number] - [place], [place] - [address]  Agent: Please be advised that RX refills may take up to 3 business days. We ask that you follow-up with your pharmacy.

## 2020-05-20 NOTE — Telephone Encounter (Signed)
I called patient to get more information. Patient states he lost the last bottle of medication that Dr. Sherrie Mustache sent in. He did not know the name of the medication. He says it was a 30 day supply antibiotic. He says the dosage was 300mg . I didn't see any medication that was similar to this description. Please advise.   Pharmacy [facility]

## 2020-05-21 MED ORDER — CEFDINIR 300 MG PO CAPS: 600.0000 mg | ORAL_CAPSULE | Freq: Every day | ORAL | 0 refills | Status: DC

## 2020-05-21 NOTE — Telephone Encounter (Signed)
He was prescribed a 10 course of clindamycin at Urgent Care. We talked about possibly starting a low dose daily antibiotic once the current infection is cleared up. That might be what he is talking about, but you definitely don't want to take clindamycin every day because he'll end up with c.diff colitis.   If his cellulitis has cleared up, then we could start a low dose of cephalexin for prevention.... however taking an antibiotic everyday might increase the risk of antibiotic resistant infection down the road.

## 2020-05-21 NOTE — Telephone Encounter (Signed)
Have sent prescription for 10 days of [facility]. Call if not much better when he finishes prescription.

## 2020-05-21 NOTE — Telephone Encounter (Signed)
Patient advised. He states he thought that Dr. Sherrie Mustache prescribed another medication but states he must be mistaking. Patient reports cellulitis is unchanged after completing 10 day course of Clindamycin prescribed at Urgent Care. Please advise if patient should schedule a OV here for evaluation or other recommendations.

## 2020-05-21 NOTE — Telephone Encounter (Signed)
Patient advised and verbalized understanding 

## 2020-05-22 ENCOUNTER — Other Ambulatory Visit: Payer: Self-pay

## 2020-05-22 DIAGNOSIS — L03116 Cellulitis of left lower limb: Secondary | ICD-10-CM

## 2020-05-22 MED ORDER — CEFDINIR 300 MG PO CAPS: 600.0000 mg | ORAL_CAPSULE | Freq: Every day | ORAL | 0 refills | Status: AC

## 2020-05-22 NOTE — Telephone Encounter (Signed)
Patient states pharmacy has not received prescription.  Patient spoke to pharmacy at 2pm.

## 2020-05-22 NOTE — Telephone Encounter (Signed)
Patient states he has never used [facility] and doesn't know why it would be sent to that pharmacy.  Rx has been switched to [facility]

## 2020-05-25 ENCOUNTER — Other Ambulatory Visit: Payer: Self-pay | Admitting: Family Medicine

## 2020-06-05 DIAGNOSIS — J45909 Unspecified asthma, uncomplicated: Secondary | ICD-10-CM | POA: Diagnosis not present

## 2020-06-05 DIAGNOSIS — J449 Chronic obstructive pulmonary disease, unspecified: Secondary | ICD-10-CM | POA: Diagnosis not present

## 2020-06-09 ENCOUNTER — Ambulatory Visit (INDEPENDENT_AMBULATORY_CARE_PROVIDER_SITE_OTHER): Payer: Medicare Other | Admitting: Family Medicine

## 2020-06-09 ENCOUNTER — Ambulatory Visit (INDEPENDENT_AMBULATORY_CARE_PROVIDER_SITE_OTHER): Payer: Medicare Other | Admitting: Pharmacist

## 2020-06-09 ENCOUNTER — Other Ambulatory Visit: Payer: Self-pay

## 2020-06-09 ENCOUNTER — Encounter: Payer: Self-pay | Admitting: Family Medicine

## 2020-06-09 VITALS — BP 145/94 | HR 92 | Temp 98.3°F | Wt 279.0 lb

## 2020-06-09 DIAGNOSIS — J45909 Unspecified asthma, uncomplicated: Secondary | ICD-10-CM | POA: Diagnosis not present

## 2020-06-09 DIAGNOSIS — E782 Mixed hyperlipidemia: Secondary | ICD-10-CM

## 2020-06-09 DIAGNOSIS — L03116 Cellulitis of left lower limb: Secondary | ICD-10-CM | POA: Insufficient documentation

## 2020-06-09 MED ORDER — CLINDAMYCIN HCL 300 MG PO CAPS: 300.0000 mg | ORAL_CAPSULE | Freq: Three times a day (TID) | ORAL | 0 refills | Status: DC

## 2020-06-09 NOTE — Patient Instructions (Signed)

## 2020-06-09 NOTE — Progress Notes (Signed)
I,Laura E Walsh,acting as a scribe for Shirlee Latch, MD.,have documented all relevant documentation on the behalf of Shirlee Latch, MD,as directed by  Shirlee Latch, MD while in the presence of Shirlee Latch, MD.  Established patient visit   Patient: Clarence Hamilton   DOB: 1956-09-04   64 y.o. Male  MRN: [id number] Visit Date: 06/09/2020  Today's healthcare provider: Shirlee Latch, MD   Chief Complaint  Patient presents with  . Cellulitis   Subjective    HPI   Follow up for Cellulitis  The patient was last seen for this 1 months ago. Changes made at last visit include taking two rounds of antibiotics.    He reports excellent compliance with treatment. He feels that condition is Worse, especially in the last few weeks.  He is not having side effects.   -----------------------------------------------------------------------------------------     Patient Active Problem List   Diagnosis Date Noted  . Cellulitis of left lower extremity 06/09/2020  . ILD (interstitial lung disease) (HCC) 12/25/2019  . Aortic atherosclerosis (HCC) 10/14/2017  . Elevated PSA 04/01/2016  . Morbid obesity, unspecified obesity type (HCC) 02/16/2016  . Dermatitis, stasis 02/06/2015  . Edema leg 02/06/2015  . Prediabetes 04/30/2014  . Hyperlipidemia, mixed 09/05/2008  . Hypertension 02/13/2008  . Anxiety disorder 10/25/1998  . 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 Smoking status: Never Smoker  . Smokeless tobacco: Never Used  Vaping Use  . Vaping Use: Never used  Substance Use Topics  . Alcohol use: No    Alcohol/week: 0.0 standard drinks  . Drug use: No   Allergies  Allergen Reactions  . Sulfa Antibiotics   . Tetracycline   . Tramadol     insomnia  . Corticosteroids     Other reaction(s): Muscle Pain     Medications: Outpatient Medications Prior to Visit  Medication Sig  . alprazolam (XANAX) 2 MG tablet TAKE 1 TABLET BY  MOUTH FOUR TIMES DAILY  . Azelastine-Fluticasone (DYMISTA) 137-50 MCG/ACT SUSP Place 1 spray into both nostrils 2 (two) times daily.  . busPIRone (BUSPAR) 15 MG tablet TAKE 1/2 TABLET BY MOUTH FOUR TIMES DAILY  . cyclobenzaprine (FLEXERIL) 5 MG tablet TAKE 1 TO 2 TABLETS(5 TO 10 MG) BY MOUTH THREE TIMES DAILY AS NEEDED FOR MUSCLE SPASMS (Patient taking differently: daily. )  . diltiazem (CARDIZEM CD) 240 MG 24 hr capsule Take 1 capsule by mouth daily. bedtime  . fluticasone (FLONASE) 50 MCG/ACT nasal spray Place 2 sprays into both nostrils every morning.   . furosemide (LASIX) 20 MG tablet Take 1 tablet by mouth daily.  Marland Kitchen HYDROcodone-acetaminophen (NORCO) 10-325 MG tablet Take 1 tablet by mouth in the morning, at noon, and at bedtime.   Marland Kitchen losartan-hydrochlorothiazide (HYZAAR) 100-25 MG tablet Take 1 tablet by mouth at bedtime.   . meloxicam (MOBIC) 15 MG tablet TAKE 1 TABLET(15 MG) BY MOUTH DAILY  . montelukast (SINGULAIR) 10 MG tablet Take 1 tablet by mouth daily.  . Multiple Vitamins-Minerals (CENTRUM SILVER PO) Take 1 tablet by mouth daily.  . mupirocin ointment (BACTROBAN) 2 % Apply 1 application topically daily.  . OXYGEN Place 3 L into the nose. At all times  . potassium chloride SA (KLOR-CON) 20 MEQ tablet TAKE 1 TABLET BY MOUTH EVERY DAY  . PROAIR HFA 108 (90 Base) MCG/ACT inhaler Inhale 1 puff into the lungs every 6 (six) hours as needed.   . simvastatin (ZOCOR) 20 MG tablet TAKE 1 TABLET BY  MOUTH EVERY NIGHT AT BEDTIME  . venlafaxine XR (EFFEXOR-XR) 75 MG 24 hr capsule TAKE 3 CAPSULES(225 MG) BY MOUTH EVERY MORNING  . niacin (NIASPAN) 1000 MG CR tablet take 1 tablet by mouth once daily (Patient not taking: Reported on 06/09/2020)   No facility-administered medications prior to visit.    Review of Systems  Constitutional: Negative.   Respiratory: Negative.   Cardiovascular: Negative.   Skin: Positive for color change, rash and wound.  Neurological: Negative.       Objective     BP (!) 145/94 (BP Location: Left Wrist, Patient Position: Sitting, Cuff Size: Normal)   Pulse 92   Temp 98.3 F (36.8 C) (Oral)   Wt 279 lb (126.6 kg)   BMI 41.20 kg/m    Physical Exam Constitutional:      General: He is not in acute distress.    Appearance: Normal appearance. He is not diaphoretic.  Cardiovascular:     Rate and Rhythm: Normal rate and regular rhythm.  Pulmonary:     Effort: Pulmonary effort is normal. No respiratory distress.  Skin:    General: Skin is warm and dry.     Findings: Rash present.     Comments: Erythematous, dry peeling skin of L ankle with surrounding erythema  Neurological:     Mental Status: He is alert and oriented to person, place, and time. Mental status is at baseline.  Psychiatric:        Mood and Affect: Mood normal.        Behavior: Behavior normal.       No results found for any visits on 06/09/20.  Assessment & Plan     1. 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 clindamycin (CLEOCIN) 300 MG capsule    Sig: Take 1 capsule (300 mg total) by mouth 3 (three) times daily.    Dispense:  21 capsule    Refill:  0  . mupirocin ointment (BACTROBAN) 2 %    Sig: Apply 1 application topically daily.    Dispense:  22 g    Refill:  1     Return in about 1 week (around 06/16/2020).      I, Shirlee Latch, MD, have reviewed all documentation for this visit. The documentation on 06/10/20 for the exam, diagnosis, procedures, and orders are all accurate and complete.   , Marzella Schlein, MD, MPH [facility]

## 2020-06-09 NOTE — Chronic Care Management (AMB) (Signed)
Chronic Care Management Pharmacy  Name: Clarence Hamilton  MRN: [id number] DOB: [birth year]  Chief Complaint/ HPI  Clarence Hamilton,  64 y.o. , male presents for their Initial CCM visit with the clinical pharmacist via telephone due to COVID-19 Pandemic.  PCP : Birdie Sons, MD  Their chronic conditions include: COPD, HTN, HLD  Office Visits: 7/19 Cellulitis, Fisher, BP 130/70 P 74 Wt 275 BMI 40.6, clindamycin 374m tid x 10d 3/2 hyperglycemia, Fisher, BP 130/80 P 93 Wt 279 BMI 41.2, ILD  Consult Visit: NA  Medications: Outpatient Encounter Medications as of 06/09/2020  Medication Sig Note  . alprazolam (XANAX) 2 MG tablet TAKE 1 TABLET BY MOUTH FOUR TIMES DAILY   . Azelastine-Fluticasone (DYMISTA) 137-50 MCG/ACT SUSP Place 1 spray into both nostrils 2 (two) times daily. 02/16/2016: [facility].  . busPIRone (BUSPAR) 15 MG tablet TAKE 1/2 TABLET BY MOUTH FOUR TIMES DAILY   . clindamycin (CLEOCIN) 300 MG capsule Take 1 capsule (300 mg total) by mouth 3 (three) times daily.   . cyclobenzaprine (FLEXERIL) 5 MG tablet TAKE 1 TO 2 TABLETS(5 TO 10 MG) BY MOUTH THREE TIMES DAILY AS NEEDED FOR MUSCLE SPASMS (Patient taking differently: daily. )   . diltiazem (CARDIZEM CD) 240 MG 24 hr capsule Take 1 capsule by mouth daily. bedtime 02/16/2016: [facility].  . Fluticasone-Salmeterol (ADVAIR) 250-50 MCG/DOSE AEPB Inhale 1 puff into the lungs 2 (two) times daily.   . furosemide (LASIX) 20 MG tablet Take 1 tablet by mouth daily. 02/16/2016: [place] [facility].  .Marland KitchenHYDROcodone-acetaminophen (NORCO) 10-325 MG tablet Take 1 tablet by mouth in the morning, at noon, and at bedtime.    .Marland Kitchenlosartan-hydrochlorothiazide (HYZAAR) 100-25 MG tablet Take 1 tablet by mouth at bedtime.  02/16/2016: Received from: [facility].  . meloxicam (MOBIC) 15 MG tablet TAKE 1 TABLET(15 MG) BY MOUTH DAILY   . montelukast (SINGULAIR) 10 MG tablet Take 1 tablet by mouth daily. 02/16/2016: [place] [facility]   . Multiple Vitamins-Minerals (CENTRUM SILVER PO) Take 1 tablet by mouth daily. 02/16/2016: [facility] from: [facility]   . OXYGEN Place 3 L into the nose. At all times   . potassium chloride SA (KLOR-CON) 20 MEQ tablet TAKE 1 TABLET BY MOUTH EVERY DAY   . PROAIR HFA 108 (90 Base) MCG/ACT inhaler Inhale 1 puff into the lungs every 6 (six) hours as needed.  02/17/2016: Received from: External Pharmacy  . simvastatin (ZOCOR) 20 MG tablet TAKE 1 TABLET BY MOUTH EVERY NIGHT AT BEDTIME   . venlafaxine XR (EFFEXOR-XR) 75 MG 24 hr capsule TAKE 3 CAPSULES(225 MG) BY MOUTH EVERY MORNING   . [DISCONTINUED] mupirocin ointment (BACTROBAN) 2 % Apply 1 application topically daily.   . fluticasone (FLONASE) 50 MCG/ACT nasal spray Place 2 sprays into both nostrils every morning.  02/16/2016: [place] [facility]: [facility]   . niacin (NIASPAN) 1000 MG CR tablet take 1 tablet by mouth once daily (Patient not taking: Reported on 06/09/2020)    No facility-administered encounter medications on file as of 06/09/2020.      Financial Resource Strain: Low Risk   . Difficulty of Paying Living Expenses: Not hard at all    Current Diagnosis/Assessment:  Goals Addressed  This Visit's Progress   . Chronic Care Management       CARE PLAN ENTRY (see longitudinal plan of care for additional care plan information)  Current Barriers:  . Chronic Disease Management support, education, and care coordination needs related to Hypertension, Hyperlipidemia, and COPD   Hypertension BP Readings from Last 3 Encounters:  06/09/20 (!) 145/94  05/12/20 130/70  12/25/19 130/80   . Pharmacist Clinical  Goal(s): o Over the next 90 days, patient will work with PharmD and providers to maintain BP goal <140/90 . Current regimen:  o Diltiazem CD 240m daily o Losartan - hydrochlorothiazide 100-259mdaily . Interventions: o None . Patient self care activities - Over the next 90 days, patient will: o Check BP weekly, document, and provide at future appointments o Ensure daily salt intake < 2300 mg/day  Hyperlipidemia Lab Results  Component Value Date/Time   LDLCALC 112 (H) 02/20/2020 10:37 AM   LDLCALC 83 10/13/2017 02:47 PM   . Pharmacist Clinical Goal(s): o Over the next 90 days, patient will work with PharmD and providers to achieve LDL goal < 100 . Current regimen:  o Simvastatin 2069m tab by mouth daily . Interventions: o Recommended stop simvastatin o Recommended start Crestor 68m71mily . Patient self care activities - Over the next 90 days, patient will: o Take rosuvastatin daily o Report any unexplained muscle pain to PharmD or provider  COPD . Pharmacist Clinical Goal(s) o Over the next 90 days, patient will work with PharmD and providers to improve breathing . Current regimen:  o Advair Diskus 250/50 mcg 1 puff twice daily o ProAir HFA 2 puffs by mouth every 6 hours as needed o Singulair 10mg49mly o Oxygen 3L daily . Interventions: o None . Patient self care activities - Over the next 90 days, patient will: o Using Advair twice daily and taking Singulair daily  Medication management . Pharmacist Clinical Goal(s): o Over the next 90 days, patient will work with PharmD and providers to maintain optimal medication adherence . Current pharmacy: [facility] . Interventions o Comprehensive medication review performed. o Continue current medication management strategy . Patient self care activities - Over the next 90 days, patient will: o Focus on medication adherence by continuing current practices o Take medications as prescribed o Report any questions or concerns  to PharmD and/or provider(s)  Initial goal documentation       Hypertension   BP goal is:  <140/90  Office blood pressures are  BP Readings from Last 3 Encounters:  06/09/20 (!) 145/94  05/12/20 130/70  12/25/19 130/80   Patient checks BP at home infrequently Patient home BP readings are ranging: NA  Patient has failed these meds in the past: NA Patient is currently uncontrolled on the following medications:  . Diltiazem CD 240mg 67my . Hyzaar 100/25mg d46m  We discussed: At goal Denies hypotension and orthostatics No unexplained cough  Plan  Continue current medications   Hyperlipidemia   LDL goal < 100  Lipid Panel     Component Value Date/Time   CHOL 191 02/20/2020 1037   TRIG 249 (H) 02/20/2020 1037   HDL 36 (L) 02/20/2020 1037   LDLCALC 112 (H) 02/20/2020 1037   LDLCALC 83 10/13/2017 1447    Hepatic Function Latest Ref Rng & Units 02/20/2020 10/13/2017 03/31/2016  Total Protein 6.0 - 8.5 g/dL 6.8 6.6 7.0  Albumin 3.8 - 4.8 g/dL 4.2 - 4.3  AST 0 - 40 IU/L 25 20 17   ALT 0 -  44 IU/L 25 25 31   Alk Phosphatase 39 - 117 IU/L 109 - 107  Total Bilirubin 0.0 - 1.2 mg/dL 0.3 0.4 0.4     The 10-year ASCVD risk score Mikey Bussing DC Jr., et al., 2013) is: 17.2%   Values used to calculate the score:     Age: 12 years     Sex: Male     Is Non-Hispanic African American: No     Diabetic: No     Tobacco smoker: No     Systolic Blood Pressure: 871 mmHg     Is BP treated: Yes     HDL Cholesterol: 36 mg/dL     Total Cholesterol: 191 mg/dL   Patient has failed these meds in past: NA Patient is currently uncontrolled on the following medications:  . Simvastatin 69m daily  We discussed:   Crestor 274mdaily  Plan  Recommend stop simvastatin Recommend start rosuvastatin 2014maily  COPD / Asthma / Tobacco   Tobacco Status:  Social History   Tobacco Use  Smoking Status Never Smoker  Smokeless Tobacco Never Used    Patient has failed these meds in past:  Flonase Patient is currently controlled on the following medications: Advair, ProAir, Dymista, Singulair, oxygen Using maintenance inhaler regularly? Yes Frequency of rescue inhaler use:  infrequently  We discussed: Dymista now affordable  Plan  Continue current medications  Medication Management   [facility] Uses pill box? Yes Pt endorses 100% compliance  We discussed:  Takes 2 bisacodyl laxatives daily Had conflict with Dr. FisCaryn Sectionave suggestions for new [facility] PCP Vitamin E for joint health  Plan  Continue current medication management strategy  Follow up: 3 month phone visit  TedMilus HeightharmD, BCGRio [phone]

## 2020-06-09 NOTE — Assessment & Plan Note (Signed)
Improved but still not resolved.   Will refill clindamycin 300mg  three times a day.  Also referral to Dermatology to evaluate and treat rash on left ankle.

## 2020-06-10 ENCOUNTER — Ambulatory Visit: Payer: Medicare Other | Admitting: Physician Assistant

## 2020-06-10 MED ORDER — MUPIROCIN 2 % EX OINT: 1.0000 "application " | TOPICAL_OINTMENT | Freq: Every day | CUTANEOUS | 1 refills | Status: DC

## 2020-06-15 NOTE — Patient Instructions (Addendum)
Visit Information  Goals Addressed            This Visit's Progress   . Chronic Care Management       CARE PLAN ENTRY (see longitudinal plan of care for additional care plan information)  Current Barriers:  . Chronic Disease Management support, education, and care coordination needs related to Hypertension, Hyperlipidemia, and COPD   Hypertension BP Readings from Last 3 Encounters:  06/09/20 (!) 145/94  05/12/20 130/70  12/25/19 130/80   . Pharmacist Clinical Goal(s): o Over the next 90 days, patient will work with PharmD and providers to maintain BP goal <140/90 . Current regimen:  o Diltiazem CD 240mg  daily o Losartan - hydrochlorothiazide 100-25mg  daily . Interventions: o None . Patient self care activities - Over the next 90 days, patient will: o Check BP weekly, document, and provide at future appointments o Ensure daily salt intake < 2300 mg/day  Hyperlipidemia Lab Results  Component Value Date/Time   LDLCALC 112 (H) 02/20/2020 10:37 AM   LDLCALC 83 10/13/2017 02:47 PM   . Pharmacist Clinical Goal(s): o Over the next 90 days, patient will work with PharmD and providers to achieve LDL goal < 100 . Current regimen:  o Simvastatin 20mg  1 tab by mouth daily . Interventions: o Recommended stop simvastatin o Recommended start Crestor 20mg  daily . Patient self care activities - Over the next 90 days, patient will: o Take rosuvastatin daily o Report any unexplained muscle pain to PharmD or provider  COPD . Pharmacist Clinical Goal(s) o Over the next 90 days, patient will work with PharmD and providers to improve breathing . Current regimen:  o Advair Diskus 250/50 mcg 1 puff twice daily o ProAir HFA 2 puffs by mouth every 6 hours as needed o Singulair 10mg  daily o Oxygen 3L daily . Interventions: o None . Patient self care activities - Over the next 90 days, patient will: o Using Advair twice daily and taking Singulair daily  Medication  management . Pharmacist Clinical Goal(s): o Over the next 90 days, patient will work with PharmD and providers to maintain optimal medication adherence . Current pharmacy: [facility] . Interventions o Comprehensive medication review performed. o Continue current medication management strategy . Patient self care activities - Over the next 90 days, patient will: o Focus on medication adherence by continuing current practices o Take medications as prescribed o Report any questions or concerns to PharmD and/or provider(s)  Initial goal documentation        Clarence Hamilton was given information about Chronic Care Management services today including:  1. CCM service includes personalized support from designated clinical staff supervised by his physician, including individualized plan of care and coordination with other care providers 2. 24/7 contact phone numbers for assistance for urgent and routine care needs. 3. Standard insurance, coinsurance, copays and deductibles apply for chronic care management only during months in which we provide at least 20 minutes of these services. Most insurances cover these services at 100%, however patients may be responsible for any copay, coinsurance and/or deductible if applicable. This service may help you avoid the need for more expensive face-to-face services. 4. Only one practitioner may furnish and bill the service in a calendar month. 5. The patient may stop CCM services at any time (effective at the end of the month) by phone call to the office staff.  Patient agreed to services and verbal consent obtained.   Print copy of patient instructions provided.  Telephone follow up appointment with pharmacy  team member scheduled for: 3 months  Felton Clinton, PharmD, BCGP, CTTS Clinical Pharmacist [facility] [phone]   Insulin Resistance  Insulin is a hormone that helps to control blood sugar (glucose) levels in the body. It is made in  the pancreas. Insulin allows glucose to enter cells in the body. Insulin sensitivity refers to how the body responds to insulin. Insulin resistance occurs when cells in the body do not respond properly to insulin made by the pancreas and are not able to absorb glucose from the bloodstream. Insulin resistance results in high blood glucose levels (hyperglycemia) and can lead to problems, including:  Prediabetes.  Type 2 diabetes (type 2 diabetes mellitus).  Heart disease.  High blood pressure (hypertension).  Stroke.  Polycystic ovary syndrome (PCOS).  Nonalcoholic fatty liver disease. What are the causes? The exact cause of insulin resistance is not known. What increases the risk? The following factors may make you more likely to develop insulin resistance:  Being overweight or obese, especially if a lot of your weight is in your waist area.  Having an inactive (sedentary) lifestyle.  Using steroids.  Being older than age 1.  Having sleep apnea.  Using tobacco products. What are the signs or symptoms? This condition usually does not cause symptoms. How is this diagnosed? There is no test to diagnose insulin resistance. However, your health care provider may diagnose insulin resistance based on:  Your blood glucose levels.  Your cholesterol levels.  A measurement of the distance around your waist (circumference). A waist circumference of more than 35 inches (88.9 cm) for women and more than 40 inches (101.6 cm) for men may be a sign of insulin resistance.  Your risk factors.  A physical exam.  Your medical history. How is this treated? Insulin resistance is treated with nutrition and lifestyle changes. These changes may include:  Eating a healthy balance of nutritious foods.  Getting more physical activity.  Maintaining a healthy weight.  Stopping the use of any tobacco products. Your health care provider will work with you to change your nutrition and  lifestyle as needed. In some cases, treatment may also include medicine to improve your insulin sensitivity. Follow these instructions at home: Activity  Be physically active. Do moderate-intensity physical activity for 30 minutes or more on 5 or more days of the week, or as much as told by your health care provider. This could be brisk walking, biking, or water aerobics.  Ask your health care provider what activities are safe for you. A mix of physical activities may be best, such as walking, swimming, cycling, and strength training. Eating and drinking   Follow a healthy meal plan. This includes eating lean proteins, complex carbohydrates, fresh fruits and vegetables, low-fat dairy products, and healthy fats.  Follow instructions from your health care provider about eating or drinking restrictions.  Make an appointment to see a diet and nutrition specialist (registered dietitian) to help you create a healthy eating plan. General instructions  Check your blood glucose levels as told by your health care provider.  Take over-the-counter and prescription medicines only as told by your health care provider.  Lose weight as told by your health care provider. ? Losing 5-7% of your body weight can reverse insulin resistance. ? Your health care provider can determine how much weight loss is best for you and can help you lose weight safely.  Do not use any tobacco products, such as cigarettes, chewing tobacco, and e-cigarettes. If you need help quitting,  ask your health care provider.  Keep all follow-up visits as told by your health care provider. This is important. Contact a health care provider if:  You have trouble losing weight or maintaining your goal weight.  You gain weight.  You have trouble following your prescribed meal plan.  You have trouble exercising more. Summary  Insulin resistance occurs when cells in the body do not respond properly to insulin made by the pancreas  and are not able to absorb blood sugar (glucose) from the bloodstream.  Your health care provider will work with you to change your nutrition and lifestyle as needed. Treatment may also include medicine to improve your insulin sensitivity.  Keep all follow-up visits as told by your health care provider. This is important. This information is not intended to replace advice given to you by your health care provider. Make sure you discuss any questions you have with your health care provider. Document Revised: 09/23/2017 Document Reviewed: 11/14/2015 Elsevier Patient Education  2020 [employer].

## 2020-06-23 ENCOUNTER — Ambulatory Visit (INDEPENDENT_AMBULATORY_CARE_PROVIDER_SITE_OTHER): Payer: Medicare Other | Admitting: Family Medicine

## 2020-06-23 ENCOUNTER — Encounter: Payer: Self-pay | Admitting: Family Medicine

## 2020-06-23 ENCOUNTER — Other Ambulatory Visit: Payer: Self-pay

## 2020-06-23 VITALS — BP 138/78 | HR 85 | Temp 99.1°F | Wt 276.6 lb

## 2020-06-23 DIAGNOSIS — L03116 Cellulitis of left lower limb: Secondary | ICD-10-CM | POA: Diagnosis not present

## 2020-06-23 MED ORDER — KETOCONAZOLE 2 % EX CREA: 1.0000 "application " | TOPICAL_CREAM | Freq: Every day | CUTANEOUS | 1 refills | Status: DC

## 2020-06-23 MED ORDER — DOXYCYCLINE HYCLATE 100 MG PO TABS: 100.0000 mg | ORAL_TABLET | Freq: Two times a day (BID) | ORAL | 0 refills | Status: DC

## 2020-06-23 NOTE — Progress Notes (Signed)
Established patient visit   Patient: Clarence Hamilton   DOB: October 10, 1956   64 y.o. Male  MRN: [id number] Visit Date: 06/23/2020  Today's healthcare provider: Mila Merry, MD   Chief Complaint  Patient presents with   Cellulitis   Subjective    HPI  Follow up for Cellulitis of left lower extremity:  The patient was last seen for this on 06/09/2020 (seen by D. Bacigalupo). Changes made at last visit include restarting a course of Clindamycin 300mg  three times daily for 7 days, and advising patient to recheck in the office in 1 week.. Had previously been prescribed cefdinir and clindamycin over the last few months and he doesn't feel any of those helped.   He reports good compliance with treatment. He feels that condition is Unchanged. Patient reports the cellulitis is probably spreading up is left legs. He is not having side effects.   -----------------------------------------------------------------------------------------      Medications: Outpatient Medications Prior to Visit  Medication Sig   alprazolam (XANAX) 2 MG tablet TAKE 1 TABLET BY MOUTH FOUR TIMES DAILY   Azelastine-Fluticasone (DYMISTA) 137-50 MCG/ACT SUSP Place 1 spray into both nostrils 2 (two) times daily.   busPIRone (BUSPAR) 15 MG tablet TAKE 1/2 TABLET BY MOUTH FOUR TIMES DAILY   clindamycin (CLEOCIN) 300 MG capsule Take 1 capsule (300 mg total) by mouth 3 (three) times daily.   cyclobenzaprine (FLEXERIL) 5 MG tablet TAKE 1 TO 2 TABLETS(5 TO 10 MG) BY MOUTH THREE TIMES DAILY AS NEEDED FOR MUSCLE SPASMS (Patient taking differently: daily. )   diltiazem (CARDIZEM CD) 240 MG 24 hr capsule Take 1 capsule by mouth daily. bedtime   fluticasone (FLONASE) 50 MCG/ACT nasal spray Place 2 sprays into both nostrils every morning.    Fluticasone-Salmeterol (ADVAIR) 250-50 MCG/DOSE AEPB Inhale 1 puff into the lungs 2 (two) times daily.   furosemide (LASIX) 20 MG tablet Take 1 tablet by mouth daily.    HYDROcodone-acetaminophen (NORCO) 10-325 MG tablet Take 1 tablet by mouth in the morning, at noon, and at bedtime.    losartan-hydrochlorothiazide (HYZAAR) 100-25 MG tablet Take 1 tablet by mouth at bedtime.    meloxicam (MOBIC) 15 MG tablet TAKE 1 TABLET(15 MG) BY MOUTH DAILY   montelukast (SINGULAIR) 10 MG tablet Take 1 tablet by mouth daily.   Multiple Vitamins-Minerals (CENTRUM SILVER PO) Take 1 tablet by mouth daily.   mupirocin ointment (BACTROBAN) 2 % Apply 1 application topically daily.   niacin (NIASPAN) 1000 MG CR tablet take 1 tablet by mouth once daily   OXYGEN Place 3 L into the nose. At all times   potassium chloride SA (KLOR-CON) 20 MEQ tablet TAKE 1 TABLET BY MOUTH EVERY DAY   PROAIR HFA 108 (90 Base) MCG/ACT inhaler Inhale 1 puff into the lungs every 6 (six) hours as needed.    simvastatin (ZOCOR) 20 MG tablet TAKE 1 TABLET BY MOUTH EVERY NIGHT AT BEDTIME   venlafaxine XR (EFFEXOR-XR) 75 MG 24 hr capsule TAKE 3 CAPSULES(225 MG) BY MOUTH EVERY MORNING   No facility-administered medications prior to visit.    Review of Systems  Constitutional: Negative.   Cardiovascular: Positive for leg swelling.  Neurological: Negative.   Hematological: Negative.       Objective    BP 138/78 (BP Location: Left Arm, Patient Position: Sitting, Cuff Size: Normal)    Pulse 85    Temp 99.1 F (37.3 C) (Oral)    Wt 276 lb 9.6 oz (125.5 kg)  SpO2 97%    BMI 40.85 kg/m    Physical Exam     No results found for any visits on 06/23/20.  Assessment & Plan      1. Cellulitis of left lower extremity Failed multiple course of clindamycin and once course of cefdinir. He is willing to try tetracycline or related antibiotic, and reports only reaction to tetracycline in the past was a headache.  - doxycycline (VIBRA-TABS) 100 MG tablet; Take 1 tablet (100 mg total) by mouth 2 (two) times daily.  Dispense: 20 tablet; Refill: 0 Cover for cofungal infection with  - ketoconazole  (NIZORAL) 2 % cream; Apply 1 application topically daily.  Dispense: 60 g; Refill: 1   Telephone follow up later this week.  No follow-ups on file.      The entirety of the information documented in the History of Present Illness, Review of Systems and Physical Exam were personally obtained by me. Portions of this information were initially documented by the CMA and reviewed by me for thoroughness and accuracy.      Mila Merry, MD  [facility] [phone] (phone) [phone] (fax)  [facility]

## 2020-06-24 ENCOUNTER — Other Ambulatory Visit: Payer: Self-pay | Admitting: Family Medicine

## 2020-06-24 DIAGNOSIS — F419 Anxiety disorder, unspecified: Secondary | ICD-10-CM

## 2020-06-27 ENCOUNTER — Telehealth: Payer: Self-pay | Admitting: Family Medicine

## 2020-06-27 NOTE — Telephone Encounter (Signed)
I called and spoke with patient. He states he was able to get the prescription filled and his leg is not getting worse since changing antibiotics.

## 2020-06-27 NOTE — Telephone Encounter (Signed)
Patient was changed to doxycycline for infection on leg on Monday. Please check and make sure he was able to get this filled and that leg isn't getting any worse since changing antibiotic.

## 2020-07-04 ENCOUNTER — Telehealth: Payer: Self-pay | Admitting: Family Medicine

## 2020-07-04 DIAGNOSIS — L03116 Cellulitis of left lower limb: Secondary | ICD-10-CM

## 2020-07-04 MED ORDER — KETOCONAZOLE 2 % EX CREA: 1.0000 "application " | TOPICAL_CREAM | Freq: Every day | CUTANEOUS | 1 refills | Status: DC

## 2020-07-04 NOTE — Telephone Encounter (Signed)
Medication Refill - Medication: ketoconazole  Has the patient contacted their pharmacy? No. (Agent: If no, request that the patient contact the pharmacy for the refill.) (Agent: If yes, when and what did the pharmacy advise?)  Preferred Pharmacy (with phone number or street name): [id number] - [place], [place] - [address]  Agent: Please be advised that RX refills may take up to 3 business days. We ask that you follow-up with your pharmacy.

## 2020-07-04 NOTE — Telephone Encounter (Signed)
Refill sent to patients pharmacy. 

## 2020-07-06 DIAGNOSIS — J449 Chronic obstructive pulmonary disease, unspecified: Secondary | ICD-10-CM | POA: Diagnosis not present

## 2020-07-06 DIAGNOSIS — J45909 Unspecified asthma, uncomplicated: Secondary | ICD-10-CM | POA: Diagnosis not present

## 2020-07-07 ENCOUNTER — Encounter: Payer: Self-pay | Admitting: Physician Assistant

## 2020-07-07 ENCOUNTER — Ambulatory Visit (INDEPENDENT_AMBULATORY_CARE_PROVIDER_SITE_OTHER): Payer: Medicare Other | Admitting: Physician Assistant

## 2020-07-07 ENCOUNTER — Other Ambulatory Visit: Payer: Self-pay

## 2020-07-07 VITALS — BP 140/78 | HR 93 | Temp 98.5°F | Wt 269.4 lb

## 2020-07-07 DIAGNOSIS — L03116 Cellulitis of left lower limb: Secondary | ICD-10-CM | POA: Diagnosis not present

## 2020-07-07 MED ORDER — TRIAMCINOLONE ACETONIDE 0.1 % EX CREA: 1.0000 "application " | TOPICAL_CREAM | Freq: Two times a day (BID) | CUTANEOUS | 0 refills | Status: DC

## 2020-07-07 MED ORDER — CEPHALEXIN 500 MG PO CAPS: 500.0000 mg | ORAL_CAPSULE | Freq: Two times a day (BID) | ORAL | 0 refills | Status: AC

## 2020-07-07 NOTE — Progress Notes (Addendum)
Established patient visit   Patient: Clarence Hamilton   DOB: May 03, 1956   64 y.o. Male  MRN: [id number] Visit Date: 07/07/2020  Today's healthcare provider: Trey Sailors, PA-C   Chief Complaint  Patient presents with  . Cellulitis  I, M ,acting as a scribe for Trey Sailors, PA-C.,have documented all relevant documentation on the behalf of Trey Sailors, PA-C,as directed by  Trey Sailors, PA-C while in the presence of Trey Sailors, PA-C.  Subjective    HPI  Follow up for Cellulitis  He was seen for this on 05/12/2020 and was given 10 day course of clindamycin.  He was seen again for this on 06/09/2020 and given another 10 day course of clindamycin.  He was seen again on 06/23/2020 and was changed to doxycycline 100 mg BID x 10 days.   The patient was last seen for this 2 weeks ago. Changes made at last visit include started taking Doxycycline 100 MG.  He reports good compliance with treatment. He feels that condition is Unchanged. He is not having side effects.   -----------------------------------------------------------------------------------------       Medications: Outpatient Medications Prior to Visit  Medication Sig  . alprazolam (XANAX) 2 MG tablet TAKE 1 TABLET BY MOUTH FOUR TIMES DAILY  . Azelastine-Fluticasone (DYMISTA) 137-50 MCG/ACT SUSP Place 1 spray into both nostrils 2 (two) times daily.  . busPIRone (BUSPAR) 15 MG tablet TAKE 1/2 TABLET BY MOUTH FOUR TIMES DAILY  . cyclobenzaprine (FLEXERIL) 5 MG tablet TAKE 1 TO 2 TABLETS(5 TO 10 MG) BY MOUTH THREE TIMES DAILY AS NEEDED FOR MUSCLE SPASMS (Patient taking differently: daily. )  . diltiazem (CARDIZEM CD) 240 MG 24 hr capsule Take 1 capsule by mouth daily. bedtime  . fluticasone (FLONASE) 50 MCG/ACT nasal spray Place 2 sprays into both nostrils every morning.   . Fluticasone-Salmeterol (ADVAIR) 250-50 MCG/DOSE AEPB Inhale 1 puff into the lungs 2 (two) times daily.  .  furosemide (LASIX) 20 MG tablet Take 1 tablet by mouth daily.  Marland Kitchen HYDROcodone-acetaminophen (NORCO) 10-325 MG tablet Take 1 tablet by mouth in the morning, at noon, and at bedtime.   Marland Kitchen ketoconazole (NIZORAL) 2 % cream Apply 1 application topically daily.  Marland Kitchen losartan-hydrochlorothiazide (HYZAAR) 100-25 MG tablet Take 1 tablet by mouth at bedtime.   . meloxicam (MOBIC) 15 MG tablet TAKE 1 TABLET(15 MG) BY MOUTH DAILY  . montelukast (SINGULAIR) 10 MG tablet Take 1 tablet by mouth daily.  . Multiple Vitamins-Minerals (CENTRUM SILVER PO) Take 1 tablet by mouth daily.  . mupirocin ointment (BACTROBAN) 2 % Apply 1 application topically daily.  . niacin (NIASPAN) 1000 MG CR tablet take 1 tablet by mouth once daily  . OXYGEN Place 3 L into the nose. At all times  . potassium chloride SA (KLOR-CON) 20 MEQ tablet TAKE 1 TABLET BY MOUTH EVERY DAY  . PROAIR HFA 108 (90 Base) MCG/ACT inhaler Inhale 1 puff into the lungs every 6 (six) hours as needed.   . simvastatin (ZOCOR) 20 MG tablet TAKE 1 TABLET BY MOUTH EVERY NIGHT AT BEDTIME  . venlafaxine XR (EFFEXOR-XR) 75 MG 24 hr capsule TAKE 3 CAPSULES(225 MG) BY MOUTH EVERY MORNING  . [DISCONTINUED] clindamycin (CLEOCIN) 300 MG capsule Take 1 capsule (300 mg total) by mouth 3 (three) times daily.  . [DISCONTINUED] doxycycline (VIBRA-TABS) 100 MG tablet Take 1 tablet (100 mg total) by mouth 2 (two) times daily.   No facility-administered medications prior to visit.  Review of Systems  Constitutional: Negative.   Cardiovascular: Positive for leg swelling.  Hematological: Negative.       Objective    BP 140/78 (BP Location: Left Arm, Patient Position: Sitting, Cuff Size: Normal)   Pulse 93   Temp 98.5 F (36.9 C) (Oral)   Wt 269 lb 6.4 oz (122.2 kg)   SpO2 97%   BMI 39.78 kg/m    Physical Exam Constitutional:      Appearance: Normal appearance. He is obese.  Skin:    General: Skin is warm and dry.  Neurological:     General: No focal deficit  present.     Mental Status: He is alert and oriented to person, place, and time.  Psychiatric:        Mood and Affect: Mood normal.        Behavior: Behavior normal.     Media Information   Document Information  Photos  Left leg  07/07/2020 15:50  Attached To:  Office Visit on 07/07/20 with Trey Sailors, PA-C  Source Information  Maryella Shivers  [facility]   Media Information   Document Information  Photos  Left leg   07/07/2020 15:50  Attached To:  Office Visit on 07/07/20 with Trey Sailors, PA-C  Source Information  Trey Sailors, PA-C  [facility]      No results found for any visits on 07/07/20.  Assessment & Plan    1. Cellulitis of left lower extremity  Worsening, does appear very dry. No purulence or drainage, do not suspect MRSA. Will get swab to see if we can get anything to grow though I am not confident as the lesion is very dry. Will also have him try steroid cream to see if it has a component of psoriasis to it. Will also refer to wound clinic.   - cephALEXin (KEFLEX) 500 MG capsule; Take 1 capsule (500 mg total) by mouth 2 (two) times daily for 5 days.  Dispense: 10 capsule; Refill: 0 - triamcinolone cream (KENALOG) 0.1 %; Apply 1 application topically 2 (two) times daily.  Dispense: 30 g; Refill: 0 - Ambulatory referral to Wound Clinic -wound culture    Return if symptoms worsen or fail to improve.      ITrey Sailors, PA-C, have reviewed all documentation for this visit. The documentation on 07/07/20 for the exam, diagnosis, procedures, and orders are all accurate and complete.  The entirety of the information documented in the History of Present Illness, Review of Systems and Physical Exam were personally obtained by me. Portions of this information were initially documented by [facility] and reviewed by me for thoroughness and accuracy.     Maryella Shivers  [facility] [phone] (phone) [phone] (fax)  [facility]

## 2020-07-07 NOTE — Patient Instructions (Signed)

## 2020-07-07 NOTE — Addendum Note (Signed)
Addended by: Trey Sailors on: 07/07/2020 04:33 PM   Modules accepted: Orders

## 2020-07-09 ENCOUNTER — Telehealth: Payer: Self-pay | Admitting: Physician Assistant

## 2020-07-09 NOTE — Telephone Encounter (Signed)
Can we call patient and see if he is open to being seen in [place] for infectious disease?  clinic is only open Tues and Thurs.

## 2020-07-09 NOTE — Telephone Encounter (Signed)
Patient states that he would like to go to [place] location.

## 2020-07-10 LAB — AEROBIC CULTURE

## 2020-07-17 ENCOUNTER — Ambulatory Visit: Payer: Medicare Other | Attending: Infectious Diseases | Admitting: Infectious Diseases

## 2020-07-17 ENCOUNTER — Other Ambulatory Visit: Payer: Self-pay

## 2020-07-17 ENCOUNTER — Encounter: Payer: Self-pay | Admitting: Infectious Diseases

## 2020-07-17 VITALS — BP 119/78 | HR 83 | Temp 98.4°F

## 2020-07-17 DIAGNOSIS — F419 Anxiety disorder, unspecified: Secondary | ICD-10-CM | POA: Diagnosis not present

## 2020-07-17 DIAGNOSIS — M1711 Unilateral primary osteoarthritis, right knee: Secondary | ICD-10-CM | POA: Insufficient documentation

## 2020-07-17 DIAGNOSIS — Z9981 Dependence on supplemental oxygen: Secondary | ICD-10-CM | POA: Diagnosis not present

## 2020-07-17 DIAGNOSIS — Z791 Long term (current) use of non-steroidal anti-inflammatories (NSAID): Secondary | ICD-10-CM | POA: Insufficient documentation

## 2020-07-17 DIAGNOSIS — Z79899 Other long term (current) drug therapy: Secondary | ICD-10-CM | POA: Diagnosis not present

## 2020-07-17 DIAGNOSIS — Z7951 Long term (current) use of inhaled steroids: Secondary | ICD-10-CM | POA: Insufficient documentation

## 2020-07-17 DIAGNOSIS — E669 Obesity, unspecified: Secondary | ICD-10-CM | POA: Diagnosis not present

## 2020-07-17 DIAGNOSIS — J449 Chronic obstructive pulmonary disease, unspecified: Secondary | ICD-10-CM | POA: Insufficient documentation

## 2020-07-17 DIAGNOSIS — L309 Dermatitis, unspecified: Secondary | ICD-10-CM | POA: Insufficient documentation

## 2020-07-17 DIAGNOSIS — I8310 Varicose veins of unspecified lower extremity with inflammation: Secondary | ICD-10-CM

## 2020-07-17 DIAGNOSIS — I872 Venous insufficiency (chronic) (peripheral): Secondary | ICD-10-CM | POA: Insufficient documentation

## 2020-07-17 DIAGNOSIS — I10 Essential (primary) hypertension: Secondary | ICD-10-CM | POA: Insufficient documentation

## 2020-07-17 DIAGNOSIS — E785 Hyperlipidemia, unspecified: Secondary | ICD-10-CM | POA: Insufficient documentation

## 2020-07-17 NOTE — Patient Instructions (Addendum)
You are here for a left leg lesion which you have been treated as cellulitis with 4 courses of antibiotics with not much of improvement- you currently using triamcinalone ( kenalog) cream and that is helping. Do not put any antibiotic cream on it. Recommend dermatologist to see you. Compression stockings

## 2020-07-17 NOTE — Progress Notes (Signed)
NAME: Clarence Hamilton  DOB: Feb 07, 1956  MRN: [id number]  Date/Time: 07/17/2020 10:39 AM  Subjective:   ? Clarence Hamilton is a 64 y.o.male  with a history of COPD/Asthma on home oxygen is here to see me for cellulitis left leg Pt has had redness and a rash on his left ankle area  for the past 3 months. He has seen [facility], PCP and treated with 4 rounds of antibiotics ( clinda, keflex, Doxy, cefdinir) with minimal response. He has been using kenalog ( triamcinalone crean) topically and that has helped a lot- He has a history of venous statis, venous eczema and has used compression stockings. He has had cellulitis before in the same leg and had seen Dr.Fitzgerald at the [facility] clinic in April 2016 and he had advised steroid topical, compression stockings, elevation of leg.  Pt has no fever or chills He has arthritis rt knee and back pain for which he gets injections with [facility]   Past Medical History:  Diagnosis Date  . Anxiety   . Arthritis   . Asthma   . History of chicken pox   . Hyperglycemia   . Hyperlipidemia   . Hypertension   . Obesity     Past Surgical History:  Procedure Laterality Date  . HERNIA REPAIR  1970's   Inguinal hernia repair x 2   . TONSILLECTOMY AND ADENOIDECTOMY  1960's    Social History   Socioeconomic History  . Marital status: Single    Spouse name: Not on file  . Number of children: 0  . Years of education: Not on file  . Highest education level: Bachelor's degree (e.g., BA, AB, BS)  Occupational History  . Occupation: Retired  Tobacco Use  . Smoking status: Never Smoker  . Smokeless tobacco: Never Used  Vaping Use  . Vaping Use: Never used  Substance and Sexual Activity  . Alcohol use: No    Alcohol/week: 0.0 standard drinks  . Drug use: No  . Sexual activity: Not on file  Other Topics Concern  . Not on file  Social History Narrative  . Not on file   Social Determinants of Health   Financial Resource Strain: Low Risk   .  Difficulty of Paying Living Expenses: Not hard at all  Food Insecurity: No Food Insecurity  . Worried About Programme researcher, broadcasting/film/video in the Last Year: Never true  . Ran Out of Food in the Last Year: Never true  Transportation Needs: No Transportation Needs  . Lack of Transportation (Medical): No  . Lack of Transportation (Non-Medical): No  Physical Activity: Inactive  . Days of Exercise per Week: 0 days  . Minutes of Exercise per Session: 0 min  Stress: No Stress Concern Present  . Feeling of Stress : Only a little  Social Connections: Socially Isolated  . Frequency of Communication with Friends and Family: More than three times a week  . Frequency of Social Gatherings with Friends and Family: More than three times a week  . Attends Religious Services: Never  . Active Member of Clubs or Organizations: No  . Attends Banker Meetings: Never  . Marital Status: Never married  Intimate Partner Violence: Not At Risk  . Fear of Current or Ex-Partner: No  . Emotionally Abused: No  . Physically Abused: No  . Sexually Abused: No    Family History  Problem Relation Age of Onset  . Hypertension Mother   . Breast cancer Mother   . Melanoma  Father   . Hypertension Father   . Parkinson's disease Father   . Throat cancer Paternal Grandmother    Allergies  Allergen Reactions  . Sulfa Antibiotics   . Tetracycline     Headache  . Tramadol     insomnia  . Corticosteroids     Other reaction(s): Muscle Pain    ? Current Outpatient Medications  Medication Sig Dispense Refill  . alprazolam (XANAX) 2 MG tablet TAKE 1 TABLET BY MOUTH FOUR TIMES DAILY 120 tablet 5  . Azelastine-Fluticasone (DYMISTA) 137-50 MCG/ACT SUSP Place 1 spray into both nostrils 2 (two) times daily.    . busPIRone (BUSPAR) 15 MG tablet TAKE 1/2 TABLET BY MOUTH FOUR TIMES DAILY 180 tablet 1  . cyclobenzaprine (FLEXERIL) 5 MG tablet TAKE 1 TO 2 TABLETS(5 TO 10 MG) BY MOUTH THREE TIMES DAILY AS NEEDED FOR MUSCLE  SPASMS (Patient taking differently: daily. ) 60 tablet 3  . diltiazem (CARDIZEM CD) 240 MG 24 hr capsule Take 1 capsule by mouth daily. bedtime    . fluticasone (FLONASE) 50 MCG/ACT nasal spray Place 2 sprays into both nostrils every morning.     . Fluticasone-Salmeterol (ADVAIR) 250-50 MCG/DOSE AEPB Inhale 1 puff into the lungs 2 (two) times daily.    . furosemide (LASIX) 20 MG tablet Take 1 tablet by mouth daily.    Marland Kitchen HYDROcodone-acetaminophen (NORCO) 10-325 MG tablet Take 1 tablet by mouth in the morning, at noon, and at bedtime.     Marland Kitchen ketoconazole (NIZORAL) 2 % cream Apply 1 application topically daily. 60 g 1  . losartan-hydrochlorothiazide (HYZAAR) 100-25 MG tablet Take 1 tablet by mouth at bedtime.     . meloxicam (MOBIC) 15 MG tablet TAKE 1 TABLET(15 MG) BY MOUTH DAILY 30 tablet 5  . montelukast (SINGULAIR) 10 MG tablet Take 1 tablet by mouth daily.    . Multiple Vitamins-Minerals (CENTRUM SILVER PO) Take 1 tablet by mouth daily.    . mupirocin ointment (BACTROBAN) 2 % Apply 1 application topically daily. 22 g 1  . niacin (NIASPAN) 1000 MG CR tablet take 1 tablet by mouth once daily 30 tablet 11  . OXYGEN Place 3 L into the nose. At all times    . potassium chloride SA (KLOR-CON) 20 MEQ tablet TAKE 1 TABLET BY MOUTH EVERY DAY 90 tablet 1  . PROAIR HFA 108 (90 Base) MCG/ACT inhaler Inhale 1 puff into the lungs every 6 (six) hours as needed.   10  . simvastatin (ZOCOR) 20 MG tablet TAKE 1 TABLET BY MOUTH EVERY NIGHT AT BEDTIME 90 tablet 3  . triamcinolone cream (KENALOG) 0.1 % Apply 1 application topically 2 (two) times daily. 453.6 g 0  . venlafaxine XR (EFFEXOR-XR) 75 MG 24 hr capsule TAKE 3 CAPSULES(225 MG) BY MOUTH EVERY MORNING 90 capsule 0   No current facility-administered medications for this visit.     Abtx:  Anti-infectives (From admission, onward)   None      REVIEW OF SYSTEMS:  Const: negative fever, negative chills, negative weight loss Eyes: negative diplopia or  visual changes, negative eye pain ENT: negative coryza, negative sore throat Resp: negative cough, hemoptysis, has dyspnea Cards: negative for chest pain, palpitations, lower extremity edema GU: negative for frequency, dysuria and hematuria GI: Negative for abdominal pain, diarrhea, bleeding, constipation Skin: negative for rash and pruritus Heme: negative for easy bruising and gum/nose bleeding MS:rt knee pain, back pain Neurolo:negative for headaches, dizziness, vertigo, memory problems  Endocrine: negative for thyroid, diabetes Allergy/Immunology- as  above Objective:  VITALS:  BP 119/78   Pulse 83   Temp 98.4 F (36.9 C) (Oral)   SpO2 94%  PHYSICAL EXAM: in a wheel chair- so examination is limited General: Alert, pleasant  no distress,on nasal oxygen  Head: Normocephalic, without obvious abnormality, atraumatic. Eyes: did not examine ENT masked  Neck: Supple,  Lungs: b/la ir entry Heart: Regular rate and rhythm, no murmur, rub or gallop. Abdomen: in wheel chair Extremities: rt leg compression tockings Left leg -pitting edema erythematous discoid raised rash over ankle area With some excoriations       Skin: as above Lymph: Cervical, supraclavicular normal. Neurologic: Grossly non-focal Pertinent Labs     Microbiology: Recent Results (from the past 240 hour(s))  Aerobic culture     Status: None   Collection Time: 07/07/20  3:45 PM   FL  Result Value Ref Range Status   Aerobic Bacterial Culture Final report  Final   Organism ID, Bacteria Mixed skin flora  Final    Comment: Light growth   ? Impression/Recommendation ? ?Left leg- venous edema  with stasis with venous eczema- This is currently not cellulitis and no need for antibiotics. Do not apply topical antibiotic cream as it can aggravate allegic reaction. Okay to apply triamcinolone, kenalog cream with moisturizer Recommend dermatology consult. Recommend compression stockings and elevation of leg If  rt knee arthritis pain is unbearable and he needs Synvisc injection ( not steroids) could be done cautiously once eczema has been evaluated by Derm ? ___________________________________________________ Discussed with patient. Note:  This document was prepared using Dragon voice recognition software and may include unintentional dictation errors.

## 2020-07-24 ENCOUNTER — Other Ambulatory Visit: Payer: Self-pay | Admitting: Family Medicine

## 2020-07-28 ENCOUNTER — Telehealth: Payer: Self-pay

## 2020-07-28 NOTE — Telephone Encounter (Signed)
Copied from CRM [id number]. Topic: General - Inquiry >> Jul 28, 2020  1:20 PM Leary Roca wrote: Reason for CRM: Pt called to inform Sherrie Mustache that his cellulitis was complete. He states he is Mositurizing his leg twice a day and also he wanted to know if he was now cleared for getting his shot in [place] . He wanted a call back from office . Please advise

## 2020-07-30 NOTE — Telephone Encounter (Signed)
I assume he is asking about steroid injection, which would be fine since there is no longer any infection.

## 2020-07-31 NOTE — Telephone Encounter (Signed)
Advised 

## 2020-08-05 DIAGNOSIS — J449 Chronic obstructive pulmonary disease, unspecified: Secondary | ICD-10-CM | POA: Diagnosis not present

## 2020-08-05 DIAGNOSIS — J45909 Unspecified asthma, uncomplicated: Secondary | ICD-10-CM | POA: Diagnosis not present

## 2020-08-07 DIAGNOSIS — M170 Bilateral primary osteoarthritis of knee: Secondary | ICD-10-CM | POA: Diagnosis not present

## 2020-08-14 DIAGNOSIS — M170 Bilateral primary osteoarthritis of knee: Secondary | ICD-10-CM | POA: Diagnosis not present

## 2020-08-19 ENCOUNTER — Ambulatory Visit (INDEPENDENT_AMBULATORY_CARE_PROVIDER_SITE_OTHER): Payer: Medicare Other | Admitting: Family Medicine

## 2020-08-19 ENCOUNTER — Other Ambulatory Visit: Payer: Self-pay

## 2020-08-19 ENCOUNTER — Encounter: Payer: Self-pay | Admitting: Family Medicine

## 2020-08-19 VITALS — BP 120/75 | HR 83 | Temp 98.2°F | Resp 22 | Wt 270.0 lb

## 2020-08-19 DIAGNOSIS — Z23 Encounter for immunization: Secondary | ICD-10-CM | POA: Diagnosis not present

## 2020-08-19 DIAGNOSIS — R739 Hyperglycemia, unspecified: Secondary | ICD-10-CM

## 2020-08-19 DIAGNOSIS — I10 Essential (primary) hypertension: Secondary | ICD-10-CM

## 2020-08-19 LAB — POCT GLYCOSYLATED HEMOGLOBIN (HGB A1C)
Est. average glucose Bld gHb Est-mCnc: 126
Hemoglobin A1C: 6 % — AB (ref 4.0–5.6)

## 2020-08-19 MED ORDER — HYDROCHLOROTHIAZIDE 25 MG PO TABS: 25.0000 mg | ORAL_TABLET | Freq: Every day | ORAL | 3 refills | Status: DC

## 2020-08-19 NOTE — Progress Notes (Signed)
Established patient visit   Patient: Clarence Hamilton   DOB: 16-Mar-1956   64 y.o. Male  MRN: [id number] Visit Date: 08/19/2020  Today's healthcare provider: Mila Merry, MD   Chief Complaint  Patient presents with  . Hypertension  . Hyperglycemia   Subjective    HPI  Hypertension, follow-up  BP Readings from Last 3 Encounters:  08/19/20 120/75  07/17/20 119/78  07/07/20 140/78   Wt Readings from Last 3 Encounters:  08/19/20 270 lb (122.5 kg)  07/07/20 269 lb 6.4 oz (122.2 kg)  06/23/20 276 lb 9.6 oz (125.5 kg)     He was last seen for hypertension 6 months ago.  BP at that visit was 130/80. Management since that visit includes continuing current medications.  He reports good compliance with treatment. He is not having side effects.  He is following a Regular diet. He is not exercising. He does not smoke.  Use of agents associated with hypertension: none.   Outside blood pressures are not checked. Symptoms: No chest pain No chest pressure  No palpitations No syncope  No dyspnea No orthopnea  No paroxysmal nocturnal dyspnea Yes lower extremity edema   Pertinent labs: Lab Results  Component Value Date   CHOL 191 02/20/2020   HDL 36 (L) 02/20/2020   LDLCALC 112 (H) 02/20/2020   TRIG 249 (H) 02/20/2020   CHOLHDL 5.3 (H) 02/20/2020   Lab Results  Component Value Date   NA 142 02/20/2020   K 4.1 02/20/2020   CREATININE 1.14 02/20/2020   GFRNONAA 69 02/20/2020   GFRAA 79 02/20/2020   GLUCOSE 117 (H) 02/20/2020     The 10-year ASCVD risk score Denman George DC Jr., et al., 2013) is: 13.5%   ---------------------------------------------------------------------------------------------------  Hyperglycemia, Follow-up  Lab Results  Component Value Date   HGBA1C 6.0 (A) 12/25/2019   HGBA1C 6.6 (H) 10/13/2017   HGBA1C 6.1 10/07/2017   GLUCOSE 117 (H) 02/20/2020   GLUCOSE 89 10/13/2017   GLUCOSE 112 (H) 03/31/2016    Last seen for for this 6 months  ago.  Management since that visit includes continuing healthy diet. Current symptoms include none and have been stable.  Prior visit with dietician: no Current diet: well balanced Current exercise: none  Pertinent Labs:    Component Value Date/Time   CHOL 191 02/20/2020 1037   TRIG 249 (H) 02/20/2020 1037   CHOLHDL 5.3 (H) 02/20/2020 1037   CHOLHDL 3.1 10/13/2017 1447   CREATININE 1.14 02/20/2020 1037   CREATININE 1.05 10/13/2017 1447    Wt Readings from Last 3 Encounters:  08/19/20 270 lb (122.5 kg)  07/07/20 269 lb 6.4 oz (122.2 kg)  06/23/20 276 lb 9.6 oz (125.5 kg)    -----------------------------------------------------------------------------------------     Medications: Outpatient Medications Prior to Visit  Medication Sig  . alprazolam (XANAX) 2 MG tablet TAKE 1 TABLET BY MOUTH FOUR TIMES DAILY  . Azelastine-Fluticasone (DYMISTA) 137-50 MCG/ACT SUSP Place 1 spray into both nostrils 2 (two) times daily.  . busPIRone (BUSPAR) 15 MG tablet TAKE 1/2 TABLET BY MOUTH FOUR TIMES DAILY  . cyclobenzaprine (FLEXERIL) 5 MG tablet TAKE 1 TO 2 TABLETS(5 TO 10 MG) BY MOUTH THREE TIMES DAILY AS NEEDED FOR MUSCLE SPASMS (Patient taking differently: daily. )  . diltiazem (CARDIZEM CD) 240 MG 24 hr capsule Take 1 capsule by mouth daily. bedtime  . fluticasone (FLONASE) 50 MCG/ACT nasal spray Place 2 sprays into both nostrils every morning.   . Fluticasone-Salmeterol (ADVAIR) 250-50 MCG/DOSE AEPB Inhale  1 puff into the lungs 2 (two) times daily.  . furosemide (LASIX) 20 MG tablet Take 1 tablet by mouth daily.  Marland Kitchen HYDROCHLOROTHIAZIDE PO Take 25 mg by mouth daily.  Marland Kitchen HYDROcodone-acetaminophen (NORCO) 10-325 MG tablet Take 1 tablet by mouth in the morning, at noon, and at bedtime.   Marland Kitchen ketoconazole (NIZORAL) 2 % cream Apply 1 application topically daily.  Marland Kitchen losartan (COZAAR) 100 MG tablet Take 100 mg by mouth daily.  . montelukast (SINGULAIR) 10 MG tablet Take 1 tablet by mouth daily.  .  Multiple Vitamins-Minerals (CENTRUM SILVER PO) Take 1 tablet by mouth daily.  . mupirocin ointment (BACTROBAN) 2 % Apply 1 application topically daily.  . niacin (NIASPAN) 1000 MG CR tablet take 1 tablet by mouth once daily  . OXYGEN Place 3 L into the nose. At all times  . potassium chloride SA (KLOR-CON) 20 MEQ tablet TAKE 1 TABLET BY MOUTH EVERY DAY  . PROAIR HFA 108 (90 Base) MCG/ACT inhaler Inhale 1 puff into the lungs every 6 (six) hours as needed.   . simvastatin (ZOCOR) 20 MG tablet TAKE 1 TABLET BY MOUTH EVERY NIGHT AT BEDTIME  . triamcinolone cream (KENALOG) 0.1 % Apply 1 application topically 2 (two) times daily.  Marland Kitchen venlafaxine XR (EFFEXOR-XR) 75 MG 24 hr capsule TAKE 3 CAPSULES(225 MG) BY MOUTH EVERY MORNING  . [DISCONTINUED] losartan-hydrochlorothiazide (HYZAAR) 100-25 MG tablet Take 1 tablet by mouth at bedtime.   . meloxicam (MOBIC) 15 MG tablet TAKE 1 TABLET(15 MG) BY MOUTH DAILY (Patient not taking: Reported on 08/19/2020)   No facility-administered medications prior to visit.    Review of Systems  Constitutional: Negative for appetite change, chills and fever.  Respiratory: Negative for chest tightness, shortness of breath and wheezing.   Cardiovascular: Positive for leg swelling. Negative for chest pain and palpitations.  Gastrointestinal: Negative for abdominal pain, nausea and vomiting.      Objective    BP 120/75 (BP Location: Right Arm, Patient Position: Sitting, Cuff Size: Large)   Pulse 83   Temp 98.2 F (36.8 C) (Oral)   Resp (!) 22   Wt 270 lb (122.5 kg)   SpO2 96% Comment: 3L 02 nasal canula  BMI 39.87 kg/m    Physical Exam    General: Appearance:    Obese male in no acute distress  Eyes:    PERRL, conjunctiva/corneas clear, EOM's intact       Lungs:     Clear to auscultation bilaterally, respirations unlabored  Heart:    Normal heart rate. Normal rhythm. No murmurs, rubs, or gallops.   MS:   All extremities are intact. 2+ bipedal edema.      Neurologic:   Awake, alert, oriented x 3. No apparent focal neurological           defect.         Results for orders placed or performed in visit on 08/19/20  POCT HgB A1C  Result Value Ref Range   Hemoglobin A1C 6.0 (A) 4.0 - 5.6 %   Est. average glucose Bld gHb Est-mCnc 126     Assessment & Plan     1. Hyperglycemia Doing well controlling a1c with diet. Exercise is limited by back and knee pain.   2. Need for influenza vaccination  - Flu Vaccine QUAD 36+ mos IM (Fluarix/Fluzone)  3. Primary hypertension Well controlled.  Had been prescribed hctz by Dr. Meredeth Ide but he has not been able to get refilled lately. Prescription refilled today.  -  hydrochlorothiazide (HYDRODIURIL) 25 MG tablet; Take 1 tablet (25 mg total) by mouth daily.  Dispense: 90 tablet; Refill: 3   Future Appointments  Date Time Provider Department Center  09/15/2020  4:00 PM [facility] CCM PHARMACIST [facility]  02/17/2021  3:40 PM Sherrie Mustache Demetrios Isaacs, MD [facility]  03/30/2021  2:40 PM [facility]-NURSE HEALTH ADVISOR [facility]         The entirety of the information documented in the History of Present Illness, Review of Systems and Physical Exam were personally obtained by me. Portions of this information were initially documented by the CMA and reviewed by me for thoroughness and accuracy.      Mila Merry, MD  [facility] [phone] (phone) [phone] (fax)  [facility]

## 2020-08-21 DIAGNOSIS — M1711 Unilateral primary osteoarthritis, right knee: Secondary | ICD-10-CM | POA: Diagnosis not present

## 2020-08-21 DIAGNOSIS — M1712 Unilateral primary osteoarthritis, left knee: Secondary | ICD-10-CM | POA: Diagnosis not present

## 2020-09-05 DIAGNOSIS — J45909 Unspecified asthma, uncomplicated: Secondary | ICD-10-CM | POA: Diagnosis not present

## 2020-09-05 DIAGNOSIS — J449 Chronic obstructive pulmonary disease, unspecified: Secondary | ICD-10-CM | POA: Diagnosis not present

## 2020-09-09 DIAGNOSIS — M5136 Other intervertebral disc degeneration, lumbar region: Secondary | ICD-10-CM | POA: Diagnosis not present

## 2020-09-11 ENCOUNTER — Other Ambulatory Visit: Payer: Self-pay | Admitting: Family Medicine

## 2020-09-15 ENCOUNTER — Ambulatory Visit: Payer: Self-pay

## 2020-09-15 DIAGNOSIS — E782 Mixed hyperlipidemia: Secondary | ICD-10-CM

## 2020-09-15 DIAGNOSIS — I10 Essential (primary) hypertension: Secondary | ICD-10-CM

## 2020-09-15 NOTE — Chronic Care Management (AMB) (Signed)
Chronic Care Management Pharmacy  Name: Clarence Hamilton  MRN: [id number] DOB: Mar 01, 1956  Chief Complaint/ HPI  Clarence Hamilton,  64 y.o. , male presents for their Follow-Up CCM visit with the clinical pharmacist via telephone due to COVID-19 Pandemic.  PCP : Birdie Sons, MD  Their chronic conditions include: HTN, HLD, Prediabetes, Asthma, Anxiety, Osteoarthritis  Office Visits: 08/09/20: Patient presented to Dr. Caryn Section for follow-up. A1c stable at 6.0%. BP 120/75, No medication changes made.  7/19 Cellulitis, Fisher, BP 130/70 P 74 Wt 275 BMI 40.6, clindamycin 332m tid x 10d 3/2 hyperglycemia, Fisher, BP 130/80 P 93 Wt 279 BMI 41.2, ILD  Consult Visit: NA  Medications: Outpatient Encounter Medications as of 09/15/2020  Medication Sig Note   meloxicam (MOBIC) 15 MG tablet TAKE 1 TABLET(15 MG) BY MOUTH DAILY    montelukast (SINGULAIR) 10 MG tablet Take 1 tablet by mouth daily. 02/16/2016: Received from: [facility]    alprazolam (XANAX) 2 MG tablet TAKE 1 TABLET BY MOUTH FOUR TIMES DAILY    Azelastine-Fluticasone (DYMISTA) 137-50 MCG/ACT SUSP Place 1 spray into both nostrils 2 (two) times daily. 02/16/2016: Received from: [facility] into the nose.   busPIRone (BUSPAR) 15 MG tablet TAKE 1/2 TABLET BY MOUTH FOUR TIMES DAILY    cyclobenzaprine (FLEXERIL) 5 MG tablet TAKE 1 TO 2 TABLETS(5 TO 10 MG) BY MOUTH THREE TIMES DAILY AS NEEDED FOR MUSCLE SPASMS (Patient taking differently: daily. )    diltiazem (CARDIZEM CD) 240 MG 24 hr capsule Take 1 capsule by mouth daily. bedtime 02/16/2016: Received from: [facility] by [facility].   fluticasone (FLONASE) 50 MCG/ACT nasal spray Place 2 sprays into both nostrils every morning.  02/16/2016: [place] [facility]: [facility]    Fluticasone-Salmeterol (ADVAIR) 250-50 MCG/DOSE AEPB Inhale 1 puff into the  lungs 2 (two) times daily.    furosemide (LASIX) 20 MG tablet Take 1 tablet by mouth daily. 02/16/2016: Received from: [facility] by [facility].   hydrochlorothiazide (HYDRODIURIL) 25 MG tablet Take 1 tablet (25 mg total) by mouth daily.    HYDROcodone-acetaminophen (NORCO) 10-325 MG tablet Take 1 tablet by mouth in the morning, at noon, and at bedtime.     ketoconazole (NIZORAL) 2 % cream Apply 1 application topically daily.    losartan (COZAAR) 100 MG tablet Take 100 mg by mouth daily.    Multiple Vitamins-Minerals (CENTRUM SILVER PO) Take 1 tablet by mouth daily. 02/16/2016: [place] [facility] [facility]    mupirocin ointment (BACTROBAN) 2 % Apply 1 application topically daily.    niacin (NIASPAN) 1000 MG CR tablet take 1 tablet by mouth once daily    OXYGEN Place 3 L into the nose. At all times    potassium chloride SA (KLOR-CON) 20 MEQ tablet TAKE 1 TABLET BY MOUTH EVERY DAY    PROAIR HFA 108 (90 Base) MCG/ACT inhaler Inhale 1 puff into the lungs every 6 (six) hours as needed.  02/17/2016: Received from: External Pharmacy   simvastatin (ZOCOR) 20 MG tablet TAKE 1 TABLET BY MOUTH EVERY NIGHT AT BEDTIME    triamcinolone cream (KENALOG) 0.1 % Apply 1 application topically 2 (two) times daily.    venlafaxine XR (EFFEXOR-XR) 75 MG 24 hr capsule TAKE 3 CAPSULES(225 MG) BY MOUTH EVERY MORNING    No facility-administered encounter medications on file as of 09/15/2020.   Current Diagnosis/Assessment:  SDOH Interventions  Most Recent Value  SDOH Interventions  Financial Strain Interventions Intervention Not Indicated  Transportation Interventions Intervention Not Indicated      Goals Addressed            This Visit's Progress    Chronic Care Management       CARE PLAN ENTRY (see longitudinal plan of care for additional care plan information)  Current Barriers:   Chronic Disease Management support,  education, and care coordination needs related to Hypertension, Hyperlipidemia, Asthma, Anxiety, and Pre-Diabetes   Hypertension BP Readings from Last 3 Encounters:  06/09/20 (!) 145/94  05/12/20 130/70  12/25/19 130/80    Pharmacist Clinical Goal(s): o Over the next 90 days, patient will work with PharmD and providers to maintain BP goal <140/90  Current regimen:  o Diltiazem CD 251m daily o Losartan 100 mg daily  o Hydrochlorothiazide 25 mg daily  Interventions: o Recommend restarting losartan-Potassium-HCTZ 100-25 mg daily  Patient self care activities - Over the next 90 days, patient will: o Check BP weekly, document, and provide at future appointments o Ensure daily salt intake < 2300 mg/day  Hyperlipidemia Lab Results  Component Value Date/Time   LDLCALC 112 (H) 02/20/2020 10:37 AM   LDLCALC 83 10/13/2017 02:47 PM    Pharmacist Clinical Goal(s): o Over the next 90 days, patient will work with PharmD and providers to achieve LDL goal < 100  Current regimen:  o Simvastatin 240m1 tab by mouth daily  Interventions: o Recommended stopping simvastatin o Recommended starting Crestor 2046maily  Patient self care activities - Over the next 90 days, patient will: o Report any unexplained muscle pain to PharmD or provider  COPD  Pharmacist Clinical Goal(s) o Over the next 90 days, patient will work with PharmD and providers to improve breathing  Current regimen:  o Advair Diskus 250/50 mcg 1 puff twice daily o ProAir HFA 2 puffs by mouth every 6 hours as needed o Singulair 51m62mily o Oxygen 3L daily  Interventions: o None  Patient self care activities - Over the next 90 days, patient will: o Using Advair twice daily and taking Singulair daily  Medication management  Pharmacist Clinical Goal(s): o Over the next 90 days, patient will work with PharmD and providers to maintain optimal medication adherence  Current pharmacy:  [facility]  Interventions o Comprehensive medication review performed. o Continue current medication management strategy  Patient self care activities - Over the next 90 days, patient will: o Focus on medication adherence by continuing current practices o Take medications as prescribed o Report any questions or concerns to PharmD and/or provider(s)      Hypertension   BP goal is:  <140/90  Office blood pressures are  BP Readings from Last 3 Encounters:  08/19/20 120/75  07/17/20 119/78  07/07/20 140/78   Lab Results  Component Value Date   CREATININE 1.14 02/20/2020   BUN 13 02/20/2020   GFRNONAA 69 02/20/2020   GFRAA 79 02/20/2020   NA 142 02/20/2020   K 4.1 02/20/2020   CALCIUM 9.3 02/20/2020   CO2 27 02/20/2020   Patient checks BP at home infrequently Patient home BP readings are ranging: NA  Patient has failed these meds in the past: NA Patient is currently controlled on the following medications:   Diltiazem CD 240mg98mly  Losartan 100 mg daily   HCTZ 25 mg daily  We discussed: Walking is very limited due to dyspnea on exertion. Blood pressure otherwise stable. Patient was unsure why his  combo BP product was changed and was interested in changing it back if possible.   Plan  Recommend stopping losartan Recommend stopping HCTZ  Recommend stopping potassium Recommend starting losartan-potassium-HCTZ 100-25 mg daily.   Hyperlipidemia   LDL goal < 100  Lipid Panel     Component Value Date/Time   CHOL 191 02/20/2020 1037   TRIG 249 (H) 02/20/2020 1037   HDL 36 (L) 02/20/2020 1037   LDLCALC 112 (H) 02/20/2020 1037   LDLCALC 83 10/13/2017 1447    Hepatic Function Latest Ref Rng & Units 02/20/2020 10/13/2017 03/31/2016  Total Protein 6.0 - 8.5 g/dL 6.8 6.6 7.0  Albumin 3.8 - 4.8 g/dL 4.2 - 4.3  AST 0 - 40 IU/L 25 20 17   ALT 0 - 44 IU/L 25 25 31   Alk Phosphatase 39 - 117 IU/L 109 - 107  Total Bilirubin 0.0 - 1.2 mg/dL 0.3 0.4 0.4     The 10-year  ASCVD risk score Mikey Bussing DC Jr., et al., 2013) is: 20.3%   Values used to calculate the score:     Age: 74 years     Sex: Male     Is Non-Hispanic African American: No     Diabetic: No     Tobacco smoker: No     Systolic Blood Pressure: 161 mmHg     Is BP treated: Yes     HDL Cholesterol: 36 mg/dL     Total Cholesterol: 191 mg/dL   Patient has failed these meds in past: NA Patient is currently uncontrolled on the following medications:   Simvastatin 58m daily  We discussed: Given patient's high (>20%) cardiovascular risk, patient would be a candidate for a high-intensity statin. Discussed patient-specific risk factors with patient, and he was amenable to changing his statin to a higher potency statin.   Denies myalgias with his current statin therapy.    Plan  Recommend stopping simvastatin Recommend start rosuvastatin 257mdaily  Asthma   Managed by Dr. FlRaul Del Tobacco Status:  Social History   Tobacco Use  Smoking Status Never Smoker  Smokeless Tobacco Never Used    Patient has failed these meds in past: Flonase Patient is currently controlled on the following medications: Advair, ProAir, Dymista, Singulair, oxygen Using maintenance inhaler regularly? Yes Frequency of rescue inhaler use:  infrequently  We discussed: Dymista now affordable  Plan  Continue current medications  Medication Management   Pt uses [facility] medications Uses pill box? Yes Pt endorses 100% compliance  We discussed:   Plan  Continue current medication management strategy  Follow up: 3 month phone visit  [place] [phone]

## 2020-09-16 DIAGNOSIS — R0600 Dyspnea, unspecified: Secondary | ICD-10-CM | POA: Diagnosis not present

## 2020-09-16 DIAGNOSIS — R600 Localized edema: Secondary | ICD-10-CM | POA: Diagnosis not present

## 2020-09-16 DIAGNOSIS — J4540 Moderate persistent asthma, uncomplicated: Secondary | ICD-10-CM | POA: Diagnosis not present

## 2020-09-16 NOTE — Patient Instructions (Signed)
Visit Information It was great speaking with you today!  Please let me know if you have any questions about our visit. Goals Addressed            This Visit's Progress    Chronic Care Management       CARE PLAN ENTRY (see longitudinal plan of care for additional care plan information)  Current Barriers:   Chronic Disease Management support, education, and care coordination needs related to Hypertension, Hyperlipidemia, Asthma, Anxiety, and Pre-Diabetes   Hypertension BP Readings from Last 3 Encounters:  06/09/20 (!) 145/94  05/12/20 130/70  12/25/19 130/80    Pharmacist Clinical Goal(s): o Over the next 90 days, patient will work with PharmD and providers to maintain BP goal <140/90  Current regimen:  o Diltiazem CD 240mg  daily o Losartan 100 mg daily  o Hydrochlorothiazide 25 mg daily  Interventions: o Recommend restarting losartan-Potassium-HCTZ 100-25 mg daily  Patient self care activities - Over the next 90 days, patient will: o Check BP weekly, document, and provide at future appointments o Ensure daily salt intake < 2300 mg/day  Hyperlipidemia Lab Results  Component Value Date/Time   LDLCALC 112 (H) 02/20/2020 10:37 AM   LDLCALC 83 10/13/2017 02:47 PM    Pharmacist Clinical Goal(s): o Over the next 90 days, patient will work with PharmD and providers to achieve LDL goal < 100  Current regimen:  o Simvastatin 20mg  1 tab by mouth daily  Interventions: o Recommended stopping simvastatin o Recommended starting Crestor 20mg  daily  Patient self care activities - Over the next 90 days, patient will: o Report any unexplained muscle pain to PharmD or provider  COPD  Pharmacist Clinical Goal(s) o Over the next 90 days, patient will work with PharmD and providers to improve breathing  Current regimen:  o Advair Diskus 250/50 mcg 1 puff twice daily o ProAir HFA 2 puffs by mouth every 6 hours as needed o Singulair 10mg  daily o Oxygen 3L  daily  Interventions: o None  Patient self care activities - Over the next 90 days, patient will: o Using Advair twice daily and taking Singulair daily  Medication management  Pharmacist Clinical Goal(s): o Over the next 90 days, patient will work with PharmD and providers to maintain optimal medication adherence  Current pharmacy: [facility]  Interventions o Comprehensive medication review performed. o Continue current medication management strategy  Patient self care activities - Over the next 90 days, patient will: o Focus on medication adherence by continuing current practices o Take medications as prescribed o Report any questions or concerns to PharmD and/or provider(s)       The patient verbalized understanding of instructions, educational materials, and care plan provided today and declined offer to receive copy of patient instructions, educational materials, and care plan.   Telephone follow up appointment with pharmacy team member scheduled for: 12/16/20 at 3:00 PM  Clinical Pharmacist [facility] [phone]

## 2020-09-23 DIAGNOSIS — M5416 Radiculopathy, lumbar region: Secondary | ICD-10-CM | POA: Diagnosis not present

## 2020-10-05 DIAGNOSIS — J449 Chronic obstructive pulmonary disease, unspecified: Secondary | ICD-10-CM | POA: Diagnosis not present

## 2020-10-05 DIAGNOSIS — J45909 Unspecified asthma, uncomplicated: Secondary | ICD-10-CM | POA: Diagnosis not present

## 2020-10-21 ENCOUNTER — Other Ambulatory Visit: Payer: Self-pay | Admitting: Family Medicine

## 2020-10-21 DIAGNOSIS — F419 Anxiety disorder, unspecified: Secondary | ICD-10-CM

## 2020-10-21 NOTE — Telephone Encounter (Signed)
Requested medication (s) are due for refill today: yes  Requested medication (s) are on the active medication list: yes  Last refill: 04/17/20  #120  5 refills  Future visit scheduled yes 02/17/21  Notes to clinic: not delegated  Requested Prescriptions  Pending Prescriptions Disp Refills   alprazolam (XANAX) 2 MG tablet [Pharmacy Med Name: ALPRAZOLAM 2MG  TABLETS] 120 tablet     Sig: TAKE 1 TABLET BY MOUTH FOUR TIMES DAILY      Not Delegated - Psychiatry:  Anxiolytics/Hypnotics Failed - 10/21/2020  5:59 PM      Failed - This refill cannot be delegated      Failed - Urine Drug Screen completed in last 360 days      Passed - Valid encounter within last 6 months    Recent Outpatient Visits           2 months ago Hyperglycemia   [facility], MD   3 months ago Cellulitis of left lower extremity   [facility] M, M   4 months ago Cellulitis of left lower extremity   [facility], MD   4 months ago Cellulitis of left lower extremity   [facility], [place], MD   5 months ago Cellulitis of left lower extremity   [facility], MD       Future Appointments             In 3 months Fisher, Malva Limes, MD [facility]

## 2020-11-05 DIAGNOSIS — J45909 Unspecified asthma, uncomplicated: Secondary | ICD-10-CM | POA: Diagnosis not present

## 2020-11-05 DIAGNOSIS — J449 Chronic obstructive pulmonary disease, unspecified: Secondary | ICD-10-CM | POA: Diagnosis not present

## 2020-11-14 ENCOUNTER — Telehealth: Payer: Self-pay

## 2020-11-14 NOTE — Progress Notes (Signed)
Chronic Care Management Pharmacy Assistant   Name: Clarence Hamilton  MRN: [id number] DOB: 1956-03-02  Reason for Encounter:Hypertension Disease State Call.  Patient Questions:  1.  Have you seen any other providers since your last visit? No  2.  Any changes in your medicines or health? No     PCP : Malva Limes, MD  Allergies:   Allergies  Allergen Reactions  . Sulfa Antibiotics   . Tetracycline     Headache  . Tramadol     insomnia  . Corticosteroids     Other reaction(s): Muscle Pain    Medications: Outpatient Encounter Medications as of 11/14/2020  Medication Sig Note  . alprazolam (XANAX) 2 MG tablet TAKE 1 TABLET BY MOUTH FOUR TIMES DAILY   . Azelastine-Fluticasone (DYMISTA) 137-50 MCG/ACT SUSP Place 1 spray into both nostrils 2 (two) times daily. 02/16/2016: Received from: [facility] Received Sig: Place into the nose.  . busPIRone (BUSPAR) 15 MG tablet TAKE 1/2 TABLET BY MOUTH FOUR TIMES DAILY   . cyclobenzaprine (FLEXERIL) 5 MG tablet TAKE 1 TO 2 TABLETS(5 TO 10 MG) BY MOUTH THREE TIMES DAILY AS NEEDED FOR MUSCLE SPASMS (Patient taking differently: daily. )   . diltiazem (CARDIZEM CD) 240 MG 24 hr capsule Take 1 capsule by mouth daily. bedtime 02/16/2016: Received from: [facility] Received Sig: Take by mouth.  . fluticasone (FLONASE) 50 MCG/ACT nasal spray Place 2 sprays into both nostrils every morning.  02/16/2016: Received from: [facility] Received Sig:   . Fluticasone-Salmeterol (ADVAIR) 250-50 MCG/DOSE AEPB Inhale 1 puff into the lungs 2 (two) times daily.   . furosemide (LASIX) 20 MG tablet Take 1 tablet by mouth daily. 02/16/2016: Received from: [facility] Received Sig: Take by mouth.  . hydrochlorothiazide (HYDRODIURIL) 25 MG tablet Take 1 tablet (25 mg total) by mouth daily.   Marland Kitchen HYDROcodone-acetaminophen (NORCO) 10-325 MG tablet Take 1 tablet by mouth in the morning, at noon, and at  bedtime.    Marland Kitchen ketoconazole (NIZORAL) 2 % cream Apply 1 application topically daily.   Marland Kitchen losartan (COZAAR) 100 MG tablet Take 100 mg by mouth daily.   . meloxicam (MOBIC) 15 MG tablet TAKE 1 TABLET(15 MG) BY MOUTH DAILY   . montelukast (SINGULAIR) 10 MG tablet Take 1 tablet by mouth daily. 02/16/2016: Received from: [facility] Received Sig:   . Multiple Vitamins-Minerals (CENTRUM SILVER PO) Take 1 tablet by mouth daily. 02/16/2016: Received from: [facility] Received Sig:   . mupirocin ointment (BACTROBAN) 2 % Apply 1 application topically daily.   . niacin (NIASPAN) 1000 MG CR tablet take 1 tablet by mouth once daily   . OXYGEN Place 3 L into the nose. At all times   . potassium chloride SA (KLOR-CON) 20 MEQ tablet TAKE 1 TABLET BY MOUTH EVERY DAY   . PROAIR HFA 108 (90 Base) MCG/ACT inhaler Inhale 1 puff into the lungs every 6 (six) hours as needed.  02/17/2016: Received from: External Pharmacy  . simvastatin (ZOCOR) 20 MG tablet TAKE 1 TABLET BY MOUTH EVERY NIGHT AT BEDTIME   . triamcinolone cream (KENALOG) 0.1 % Apply 1 application topically 2 (two) times daily.   Marland Kitchen venlafaxine XR (EFFEXOR-XR) 75 MG 24 hr capsule TAKE 3 CAPSULES(225 MG) BY MOUTH EVERY MORNING    No facility-administered encounter medications on file as of 11/14/2020.    Current Diagnosis: Patient Active Problem List   Diagnosis Date Noted  . Cellulitis of left lower  extremity 06/09/2020  . ILD (interstitial lung disease) (HCC) 12/25/2019  . Aortic atherosclerosis (HCC) 10/14/2017  . Elevated PSA 04/01/2016  . Morbid obesity, unspecified obesity type (HCC) 02/16/2016  . Dermatitis, stasis 02/06/2015  . Edema leg 02/06/2015  . Prediabetes 04/30/2014  . Hyperlipidemia, mixed 09/05/2008  . Hypertension 02/13/2008  . Anxiety disorder 10/25/1998  . Asthma, intrinsic 10/25/1968    Goals Addressed   None    Reviewed chart prior to disease state call. Spoke with patient regarding  BP  Recent Office Vitals: BP Readings from Last 3 Encounters:  08/19/20 120/75  07/17/20 119/78  07/07/20 140/78   Pulse Readings from Last 3 Encounters:  08/19/20 83  07/17/20 83  07/07/20 93    Wt Readings from Last 3 Encounters:  08/19/20 270 lb (122.5 kg)  07/07/20 269 lb 6.4 oz (122.2 kg)  06/23/20 276 lb 9.6 oz (125.5 kg)     Kidney Function Lab Results  Component Value Date/Time   CREATININE 1.14 02/20/2020 10:37 AM   CREATININE 1.05 10/13/2017 02:47 PM   CREATININE 1.04 03/31/2016 10:20 AM   GFRNONAA 69 02/20/2020 10:37 AM   GFRNONAA 77 10/13/2017 02:47 PM   GFRAA 79 02/20/2020 10:37 AM   GFRAA 89 10/13/2017 02:47 PM    BMP Latest Ref Rng & Units 02/20/2020 10/13/2017 03/31/2016  Glucose 65 - 99 mg/dL 160(F) 89 093(A)  BUN 8 - 27 mg/dL 13 12 13   Creatinine 0.76 - 1.27 mg/dL 3.55 7.32  BUN/Creat Ratio 10 - 24 11 NOT APPLICABLE 13  Sodium 134 - 144 mmol/L 142 142 142  Potassium 3.5 - 5.2 mmol/L 4.1 3.2(L) 3.9  Chloride 96 - 106 mmol/L 98 97(L) 96  CO2 20 - 29 mmol/L 27 37(H) 31(H)  Calcium 8.6 - 10.2 mg/dL 9.3 9.4 9.5    . Current antihypertensive regimen:  ? Diltiazem CD 240mg  daily ? Losartan 100 mg daily  ? Hydrochlorothiazide 25 mg daily . How often are you checking your Blood Pressure? infrequently . Current home BP readings: Patient states his blood pressure has been ranging around 120/80.Patient states he found a new monitor that he will start using . What recent interventions/DTPs have been made by any provider to improve Blood Pressure control since last CPP Visit: None ID . Any recent hospitalizations or ED visits since last visit with CPP? No . What diet changes have been made to improve Blood Pressure Control?  o Patient states he was looking at purchasing pre made meals online. . What exercise is being done to improve your Blood Pressure Control?  o Patient states he has not been exercise due to his leg,back pain . Patient states he started  to have left arm pain for 3 weeks now ,patient is going to schedule appointment to follow up on his arm pain.  Adherence Review: Is the patient currently on ACE/ARB medication? Yes Does the patient have >5 day gap between last estimated fill dates? Yes   2.02  Follow-Up:  Pharmacist Review   Clinical Pharmacist Assistant [phone]

## 2020-11-24 ENCOUNTER — Telehealth: Payer: Self-pay

## 2020-11-24 NOTE — Progress Notes (Signed)
Chronic Care Management Pharmacy Assistant   Name: Clarence Hamilton  MRN: [id number] DOB: 17-May-1956  Reason for Encounter: Medication Review PCP : Malva Limes, MD  Allergies:   Allergies  Allergen Reactions  . Sulfa Antibiotics   . Tetracycline     Headache  . Tramadol     insomnia  . Corticosteroids     Other reaction(s): Muscle Pain    Medications: Outpatient Encounter Medications as of 11/24/2020  Medication Sig Note  . alprazolam (XANAX) 2 MG tablet TAKE 1 TABLET BY MOUTH FOUR TIMES DAILY   . Azelastine-Fluticasone (DYMISTA) 137-50 MCG/ACT SUSP Place 1 spray into both nostrils 2 (two) times daily. 02/16/2016: Received from: [facility] Received Sig: Place into the nose.  . busPIRone (BUSPAR) 15 MG tablet TAKE 1/2 TABLET BY MOUTH FOUR TIMES DAILY   . cyclobenzaprine (FLEXERIL) 5 MG tablet TAKE 1 TO 2 TABLETS(5 TO 10 MG) BY MOUTH THREE TIMES DAILY AS NEEDED FOR MUSCLE SPASMS (Patient taking differently: daily. )   . diltiazem (CARDIZEM CD) 240 MG 24 hr capsule Take 1 capsule by mouth daily. bedtime 02/16/2016: Received from: [facility] Received Sig: Take by mouth.  . fluticasone (FLONASE) 50 MCG/ACT nasal spray Place 2 sprays into both nostrils every morning.  02/16/2016: Received from: [facility] Received Sig:   . Fluticasone-Salmeterol (ADVAIR) 250-50 MCG/DOSE AEPB Inhale 1 puff into the lungs 2 (two) times daily.   . furosemide (LASIX) 20 MG tablet Take 1 tablet by mouth daily. 02/16/2016: Received from: [facility] Received Sig: Take by mouth.  . hydrochlorothiazide (HYDRODIURIL) 25 MG tablet Take 1 tablet (25 mg total) by mouth daily.   Marland Kitchen HYDROcodone-acetaminophen (NORCO) 10-325 MG tablet Take 1 tablet by mouth in the morning, at noon, and at bedtime.    Marland Kitchen ketoconazole (NIZORAL) 2 % cream Apply 1 application topically daily.   Marland Kitchen losartan (COZAAR) 100 MG tablet Take 100 mg by mouth daily.   .  meloxicam (MOBIC) 15 MG tablet TAKE 1 TABLET(15 MG) BY MOUTH DAILY   . montelukast (SINGULAIR) 10 MG tablet Take 1 tablet by mouth daily. 02/16/2016: Received from: [facility] Received Sig:   . Multiple Vitamins-Minerals (CENTRUM SILVER PO) Take 1 tablet by mouth daily. 02/16/2016: Received from: [facility] Received Sig:   . mupirocin ointment (BACTROBAN) 2 % Apply 1 application topically daily.   . niacin (NIASPAN) 1000 MG CR tablet take 1 tablet by mouth once daily   . OXYGEN Place 3 L into the nose. At all times   . potassium chloride SA (KLOR-CON) 20 MEQ tablet TAKE 1 TABLET BY MOUTH EVERY DAY   . PROAIR HFA 108 (90 Base) MCG/ACT inhaler Inhale 1 puff into the lungs every 6 (six) hours as needed.  02/17/2016: Received from: External Pharmacy  . simvastatin (ZOCOR) 20 MG tablet TAKE 1 TABLET BY MOUTH EVERY NIGHT AT BEDTIME   . triamcinolone cream (KENALOG) 0.1 % Apply 1 application topically 2 (two) times daily.   Marland Kitchen venlafaxine XR (EFFEXOR-XR) 75 MG 24 hr capsule TAKE 3 CAPSULES(225 MG) BY MOUTH EVERY MORNING    No facility-administered encounter medications on file as of 11/24/2020.    Current Diagnosis: Patient Active Problem List   Diagnosis Date Noted  . Cellulitis of left lower extremity 06/09/2020  . ILD (interstitial lung disease) (HCC) 12/25/2019  . Aortic atherosclerosis (HCC) 10/14/2017  . Elevated PSA 04/01/2016  . Morbid obesity, unspecified obesity type (HCC) 02/16/2016  . Dermatitis,  stasis 02/06/2015  . Edema leg 02/06/2015  . Prediabetes 04/30/2014  . Hyperlipidemia, mixed 09/05/2008  . Hypertension 02/13/2008  . Anxiety disorder 10/25/1998  . Asthma, intrinsic 10/25/1968    Goals Addressed   None    Reviewed chart and adherence measures. Per insurance data patient is 100 % adherent to simvastatin for cholesterol and 100% adherent to Losartan for hypertension . Follow-Up:  Pharmacist Review   Everlean Cherry Clinical  Pharmacist Assistant [phone]

## 2020-12-06 DIAGNOSIS — J449 Chronic obstructive pulmonary disease, unspecified: Secondary | ICD-10-CM | POA: Diagnosis not present

## 2020-12-06 DIAGNOSIS — J45909 Unspecified asthma, uncomplicated: Secondary | ICD-10-CM | POA: Diagnosis not present

## 2020-12-14 ENCOUNTER — Other Ambulatory Visit: Payer: Self-pay | Admitting: Family Medicine

## 2020-12-14 DIAGNOSIS — F419 Anxiety disorder, unspecified: Secondary | ICD-10-CM

## 2020-12-15 ENCOUNTER — Telehealth: Payer: Self-pay

## 2020-12-15 NOTE — Progress Notes (Signed)
Spoke to patient to confirmed patient telephone appointment on 12/16/2020 for CCM at 3:00 pm with Angelena Sole the Clinical pharmacist.   Patient verbalized understanding. Patient denies any side effects to current medications.  Everlean Cherry Clinical Pharmacist Assistant [phone]

## 2020-12-16 ENCOUNTER — Ambulatory Visit (INDEPENDENT_AMBULATORY_CARE_PROVIDER_SITE_OTHER): Payer: Medicare Other

## 2020-12-16 DIAGNOSIS — I10 Essential (primary) hypertension: Secondary | ICD-10-CM

## 2020-12-16 DIAGNOSIS — E782 Mixed hyperlipidemia: Secondary | ICD-10-CM

## 2020-12-16 NOTE — Progress Notes (Signed)
Chronic Care Management Pharmacy Note  12/17/2020 Name:  Clarence Hamilton MRN:  [id number] DOB:  May 21, 1956  Subjective: Clarence Hamilton is an 65 y.o. year old male who is a primary patient of Fisher, Kirstie Peri, MD.  The CCM team was consulted for assistance with disease management and care coordination needs.    Engaged with patient by telephone for follow up visit in response to provider referral for pharmacy case management and/or care coordination services.   Consent to Services:  The patient was given the following information about Chronic Care Management services today, agreed to services, and gave verbal consent: 1. CCM service includes personalized support from designated clinical staff supervised by the primary care provider, including individualized plan of care and coordination with other care providers 2. 24/7 contact phone numbers for assistance for urgent and routine care needs. 3. Service will only be billed when office clinical staff spend 20 minutes or more in a month to coordinate care. 4. Only one practitioner may furnish and bill the service in a calendar month. 5.The patient may stop CCM services at any time (effective at the end of the month) by phone call to the office staff. 6. The patient will be responsible for cost sharing (co-pay) of up to 20% of the service fee (after annual deductible is met). Patient agreed to services and consent obtained.  Patient Care Team: Birdie Sons, MD as PCP - General (Family Medicine) Erby Pian, MD as Referring Physician (Pulmonary Disease) Pa, [place] (Optometry) Suella Broad, MD as Consulting Physician (Physical Medicine and Rehabilitation) Latanya Maudlin, MD as Consulting Physician (Orthopedic Surgery) Levy Pupa, PA-C as Physician Assistant ([place]) Germaine Pomfret, [facility] as Pharmacist (Pharmacist)  Recent office visits: 08/09/20: Patient presented to Dr. Caryn Section for follow-up. A1c  stable at 6.0%. BP 120/75, No medication changes made.   Recent consult visits: 09/16/20: Patient presented to Dr. Raul Del (Pulmonology) to re-establish care. Plan for PFT in 5 months. No medication changes made.   Hospital visits: None in previous 6 months  Objective:  Lab Results  Component Value Date   CREATININE 1.14 02/20/2020   BUN 13 02/20/2020   GFRNONAA 69 02/20/2020   GFRAA 79 02/20/2020   NA 142 02/20/2020   K 4.1 02/20/2020   CALCIUM 9.3 02/20/2020   CO2 27 02/20/2020    Lab Results  Component Value Date/Time   HGBA1C 6.0 (A) 08/19/2020 04:33 PM   HGBA1C 6.0 (A) 12/25/2019 02:09 PM   HGBA1C 6.6 (H) 10/13/2017 02:47 PM   HGBA1C 6.2 (H) 08/30/2016 11:37 AM    Last diabetic Eye exam: No results found for: HMDIABEYEEXA  Last diabetic Foot exam: No results found for: HMDIABFOOTEX   Lab Results  Component Value Date   CHOL 191 02/20/2020   HDL 36 (L) 02/20/2020   LDLCALC 112 (H) 02/20/2020   TRIG 249 (H) 02/20/2020   CHOLHDL 5.3 (H) 02/20/2020    Hepatic Function Latest Ref Rng & Units 02/20/2020 10/13/2017 03/31/2016  Total Protein 6.0 - 8.5 g/dL 6.8 6.6 7.0  Albumin 3.8 - 4.8 g/dL 4.2 - 4.3  AST 0 - 40 IU/L _0 ALT 0 - 44 IU/L _1 Alk Phosphatase 39 - 117 IU/L 109 - 107  Total Bilirubin 0.0 - 1.2 mg/dL 0.3 0.4 0.4    Lab Results  Component Value Date/Time   TSH 1.200 02/20/2020 10:37 AM   TSH 1.490 03/31/2016 10:20 AM    CBC Latest  Ref Rng & Units 02/20/2020 03/31/2016  WBC 3.4 - 10.8 x10E3/uL 12.8(H) 9.9  Hemoglobin 13.0 - 17.7 g/dL 13.1 14.9  Hematocrit 37.5 - 51.0 % 39.2 44.5  Platelets 150 - 450 x10E3/uL 286 278    No results found for: VD25OH  Clinical ASCVD: Yes  The 10-year ASCVD risk score Mikey Bussing DC Jr., et al., 2013) is: 12.6%   Values used to calculate the score:     Age: 64 years     Sex: Male     Is Non-Hispanic African American: No     Diabetic: No     Tobacco smoker: No     Systolic Blood Pressure: 938 mmHg     Is BP  treated: Yes     HDL Cholesterol: 36 mg/dL     Total Cholesterol: 191 mg/dL    Depression screen [facility] 2/9 08/19/2020 12/25/2019 12/02/2017  Decreased Interest 0 0 0  Down, Depressed, Hopeless 0 1 0  PHQ - 2 Score 0 1 0  Altered sleeping 0 - -  Tired, decreased energy 0 - -  Change in appetite 1 - -  Feeling bad or failure about yourself  0 - -  Trouble concentrating 0 - -  Moving slowly or fidgety/restless 0 - -  Suicidal thoughts 0 - -  PHQ-9 Score 1 - -  Difficult doing work/chores Not difficult at all - -    Social History   Tobacco Use  Smoking Status Never Smoker  Smokeless Tobacco Never Used   BP Readings from Last 3 Encounters:  08/19/20 120/75  07/17/20 119/78  07/07/20 140/78   Pulse Readings from Last 3 Encounters:  08/19/20 83  07/17/20 83  07/07/20 93   Wt Readings from Last 3 Encounters:  08/19/20 270 lb (122.5 kg)  07/07/20 269 lb 6.4 oz (122.2 kg)  06/23/20 276 lb 9.6 oz (125.5 kg)    Assessment/Interventions: Review of patient past medical history, allergies, medications, health status, including review of consultants reports, laboratory and other test data, was performed as part of comprehensive evaluation and provision of chronic care management services.   SDOH:  (Social Determinants of Health) assessments and interventions performed: Yes SDOH Interventions   Flowsheet Row Most Recent Value  SDOH Interventions   Financial Strain Interventions Intervention Not Indicated  Transportation Interventions Intervention Not Indicated      CCM Care Plan  Allergies  Allergen Reactions  . Sulfa Antibiotics   . Tetracycline     Headache  . Tramadol     insomnia  . Corticosteroids     Other reaction(s): Muscle Pain    Medications Reviewed Today    Reviewed by Randal Buba, CMA (Certified Medical Assistant) on 08/19/20 at [place] List Status: <None>  Medication Order Taking? Sig Documenting Provider Last Dose Status Informant  alprazolam  (XANAX) 2 MG tablet [id number] Yes TAKE 1 TABLET BY MOUTH FOUR TIMES DAILY Birdie Sons, MD Taking Active   Azelastine-Fluticasone [facility]) 137-50 MCG/ACT SUSP [id number] Yes Place 1 spray into both nostrils 2 (two) times daily. [provider] Taking Active            Med Note Josiah Lobo, Deveron Furlong   Mon Feb 16, 2016 12:04 PM) Received from: [place]: Place into the nose.  busPIRone (BUSPAR) 15 MG tablet [id number] Yes TAKE 1/2 TABLET BY MOUTH FOUR TIMES DAILY Birdie Sons, MD Taking Active   cyclobenzaprine (FLEXERIL) 5 MG tablet [id number] Yes TAKE 1 TO 2 TABLETS(5  TO 10 MG) BY MOUTH THREE TIMES DAILY AS NEEDED FOR MUSCLE SPASMS  Patient taking differently: daily.    Birdie Sons, MD Taking Active   diltiazem (CARDIZEM CD) 240 MG 24 hr capsule [id number] Yes Take 1 capsule by mouth daily. bedtime [provider] Taking Active            Med Note Josiah Lobo, ROSHENA L   Mon Feb 16, 2016 12:04 PM) Received from: [place]: Take by mouth.  fluticasone (FLONASE) 50 MCG/ACT nasal spray [id number] Yes Place 2 sprays into both nostrils every morning.  [provider] Taking Active            Med Note Josiah Lobo, Deveron Furlong   Mon Feb 16, 2016 12:04 PM) Received from: [place]:   Fluticasone-Salmeterol (ADVAIR) 250-50 MCG/DOSE AEPB [id number] Yes Inhale 1 puff into the lungs 2 (two) times daily. [provider] Taking Active Self  furosemide (LASIX) 20 MG tablet [id number] Yes Take 1 tablet by mouth daily. [provider] Taking Active            Med Note Josiah Lobo, Deveron Furlong   Mon Feb 16, 2016 12:04 PM) Received from: [place]: Take by mouth.  HYDROcodone-acetaminophen (NORCO) 10-325 MG tablet [id number] Yes Take 1 tablet by mouth in the morning, at noon, and at bedtime.  [provider] Taking Active   ketoconazole  (NIZORAL) 2 % cream [id number] Yes Apply 1 application topically daily. Birdie Sons, MD Taking Active   losartan-hydrochlorothiazide [facility]) 100-25 MG tablet [id number] Yes Take 1 tablet by mouth at bedtime.  [provider] Taking Active            Med Note Josiah Lobo, Deveron Furlong   Mon Feb 16, 2016 12:04 PM) Received from: [place]: Take by mouth.  meloxicam (MOBIC) 15 MG tablet [id number] No TAKE 1 TABLET(15 MG) BY MOUTH DAILY  Patient not taking: Reported on 08/19/2020   Birdie Sons, MD Not Taking Active   montelukast (SINGULAIR) 10 MG tablet [id number] Yes Take 1 tablet by mouth daily. [provider] Taking Active            Med Note Josiah Lobo, Deveron Furlong   Mon Feb 16, 2016 12:04 PM) Received from: [place]:   Multiple Vitamins-Minerals (CENTRUM SILVER PO) [id number] Yes Take 1 tablet by mouth daily. [provider] Taking Active            Med Note Josiah Lobo, Deveron Furlong   Mon Feb 16, 2016 12:04 PM) Received from: [place]:   mupirocin ointment (BACTROBAN) 2 % [id number] Yes Apply 1 application topically daily. Virginia Crews, MD Taking Active   niacin (NIASPAN) 1000 MG CR tablet [id number] Yes take 1 tablet by mouth once daily Birdie Sons, MD Taking Active   OXYGEN [id number] Yes Place 3 L into the nose. At all times [provider] Taking Active   potassium chloride SA (KLOR-CON) 20 MEQ tablet [id number] Yes TAKE 1 TABLET BY MOUTH EVERY DAY Birdie Sons, MD Taking Active   PROAIR HFA 108 [phone] Base) MCG/ACT inhaler [id number] Yes Inhale 1 puff into the lungs every 6 (six) hours as needed.  [provider] Taking Active            Med Note Sabra Heck, Kem Kays   Tue Feb 17, 2016  2:20 PM) Received from: External Pharmacy  simvastatin (  ZOCOR) 20 MG tablet [id number] Yes TAKE 1 TABLET BY MOUTH EVERY NIGHT AT BEDTIME Birdie Sons, MD Taking  Active   triamcinolone cream (KENALOG) 0.1 % [id number] Yes Apply 1 application topically 2 (two) times daily. Trinna Post, PA-C Taking Active   venlafaxine XR (EFFEXOR-XR) 75 MG 24 hr capsule [id number] Yes TAKE 3 CAPSULES(225 MG) BY MOUTH EVERY MORNING Birdie Sons, MD Taking Active           Patient Active Problem List   Diagnosis Date Noted  . Cellulitis of left lower extremity 06/09/2020  . ILD (interstitial lung disease) ([place]) 12/25/2019  . Aortic atherosclerosis ([place]) 10/14/2017  . Elevated PSA 04/01/2016  . Morbid obesity, unspecified obesity type ([place]) 02/16/2016  . Dermatitis, stasis 02/06/2015  . Edema leg 02/06/2015  . Prediabetes 04/30/2014  . Hyperlipidemia, mixed 09/05/2008  . Hypertension 02/13/2008  . Anxiety disorder 10/25/1998  . 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 H1N1 08/29/2008  . Influenza Split 10/03/2014  . Influenza,inj,Quad PF,6+ Mos 08/19/2020  . Influenza-Unspecified 09/07/2017, 07/25/2018, 07/31/2019  . Tdap 02/17/2016    Conditions to be addressed/monitored:  Hypertension, Hyperlipidemia, Asthma, Anxiety, Osteoarthritis and Prediabetes  Care Plan : General Pharmacy (Adult)  Updates made by Germaine Pomfret, RPH since 12/17/2020 12:00 AM    Problem: Hypertension, Hyperlipidemia, Asthma, Anxiety, Osteoarthritis and Prediabetes   Priority: High    Long-Range Goal: Patient-Specific Goal   Start Date: 12/17/2020  Expected End Date: 06/16/2021  This Visit's Progress: On track  Priority: High  Note:   Current Barriers:  . Unable to independently monitor therapeutic efficacy  Pharmacist Clinical Goal(s):  Marland Kitchen Over the next 90 days, patient will maintain control of blood pressure  as evidenced by BP less than 140/90  through collaboration with PharmD and provider.   Interventions: . 1:1 collaboration with Birdie Sons, MD regarding development and update of comprehensive plan  of care as evidenced by provider attestation and co-signature . Inter-disciplinary care team collaboration (see longitudinal plan of care) . Comprehensive medication review performed; medication list updated in electronic medical record  Hypertension (BP goal <140/90) -controlled -Current treatment: . Diltiazem CD 240 mg daily  . Furosemide 20 mg daily  . HCTZ 25 mg daily  . Losartan 100 mg daily  -Medications previously tried: NA  -Current home readings: NA,  -Current dietary habits: Does not follow any specific dietary regimen. Does try to limit salt. -Current exercise habits: Does not follow any specific exercise regimen. Patient is limited by osteoarthritis pain.  -Denies hypotensive/hypertensive symptoms -Educated on Daily salt intake goal < 2300 mg; Importance of home blood pressure monitoring; -Counseled to monitor BP at home weekly, document, and provide log at future appointments -Recommended to continue current medication  Hyperlipidemia: (LDL goal < 70) -uncontrolled -Current treatment: . Simvastatin 20 mg nightly  -Medications previously tried: NA  -Educated on Benefits of statin for ASCVD risk reduction; Importance of limiting foods high in cholesterol; -Given patient's high (>20%) cardiovascular risk, patient would be a candidate for a high-intensity statin. Discussed patient-specific risk factors with patient, and he was amenable to changing his statin to a higher potency statin.  -Recommend stopping simvastatin -Recommend start rosuvastatin 16m daily  Asthma (Goal: control symptoms and prevent exacerbations) -Managed by Dr. FRaul Del -controlled -Current treatment  . Dymista 137-60 mcg/act 1 spray twice daily  . Flonase nasal spray  . Advair 250-50 mcg/act 1 puff twice daily  . Monteluakst 10 mg  nightly  . ProAir HFA  -Medications previously tried: NA  -Pulmonary function testing: NA -Exacerbations requiring treatment in last 6 months: None -Patient reports  consistent use of maintenance inhaler -Frequency of rescue inhaler use: infrequently -Counseled on When to use rescue inhaler -Recommended to continue current medication  Chronic Pain (Goal: Provide adequate pain relief) -Controlled  -Receives knee injections every 6 months  -Current treatment   . Hydrocodone-APAP 10-325 mg three times daily  . Meloxicam 15 mg daily  -Medications previously tried: NA   -Recommended to continue current medication  Depression/Anxiety (Goal: Maintain stable mood) -Controlled -Current treatment: . Alprazolam 2 mg four times daily  . Buspirone 15 mg 1/2 tablet three times daily  . Venlafaxine XR 75 mg 3 capsules daily  -Medications previously tried/failed: NA -PHQ9: 1 -GAD7: NA -Educated on Benefits of medication for symptom control -Recommended to continue current medication  Patient Goals/Self-Care Activities . Over the next 90 days, patient will:  - check blood pressure weekly, document, and provide at future appointments  Follow Up Plan: Telephone follow up appointment with care management team member scheduled for: 06/01/2021 at 3:00 PM       Medication Assistance: None required.  Patient affirms current coverage meets needs.  Patient's preferred pharmacy is:  [facility] [place], [place] - [place] [address] [place] [address] Phone: [phone] Fax: [phone]  Uses pill box? Yes Pt endorses 100% compliance  We discussed: Current pharmacy is preferred with insurance plan and patient is satisfied with pharmacy services Patient decided to: Continue current medication management strategy  Care Plan and Follow Up Patient Decision:  Patient agrees to Care Plan and Follow-up.  Plan: Telephone follow up appointment with care management team member scheduled for:  06/01/2021 at 3:00 PM   [place] [phone]

## 2020-12-17 NOTE — Patient Instructions (Signed)
Visit Information It was great speaking with you today!  Please let me know if you have any questions about our visit.  Goals Addressed            This Visit's Progress   . Track and Manage My Symptoms-Asthma       Timeframe:  Long-Range Goal Priority:  High Start Date:   12/16/2020                          Expected End Date:  06/01/2021                     Follow Up Date 06/01/2021    - avoid symptom triggers outdoors - follow asthma action plan - keep rescue medicines on hand    Why is this important?    Keeping track of asthma symptoms can tell you a lot about your asthma control.   Based on symptoms and peak flow results you can see how well you are doing.   Your asthma action plan has a green, yellow and red zone. Green means all is good; it is your goal. Yellow means your symptoms are a little worse. You will need to adjust your medications. Being in the red zone means that your    asthma is out of control. You will need to use your rescue medicines. You may need emergency care.     Notes:        Patient Care Plan: General Pharmacy (Adult)    Problem Identified: Hypertension, Hyperlipidemia, Asthma, Anxiety, Osteoarthritis and Prediabetes   Priority: High    Long-Range Goal: Patient-Specific Goal   Start Date: 12/17/2020  Expected End Date: 06/16/2021  This Visit's Progress: On track  Priority: High  Note:   Current Barriers:  . Unable to independently monitor therapeutic efficacy  Pharmacist Clinical Goal(s):  Marland Kitchen Over the next 90 days, patient will maintain control of blood pressure  as evidenced by BP less than 140/90  through collaboration with PharmD and provider.   Interventions: . 1:1 collaboration with Malva Limes, MD regarding development and update of comprehensive plan of care as evidenced by provider attestation and co-signature . Inter-disciplinary care team collaboration (see longitudinal plan of care) . Comprehensive medication review  performed; medication list updated in electronic medical record  Hypertension (BP goal <140/90) -controlled -Current treatment: . Diltiazem CD 240 mg daily  . Furosemide 20 mg daily  . HCTZ 25 mg daily  . Losartan 100 mg daily  -Medications previously tried: NA  -Current home readings: NA,  -Current dietary habits: Does not follow any specific dietary regimen. Does try to limit salt. -Current exercise habits: Does not follow any specific exercise regimen. Patient is limited by osteoarthritis pain.  -Denies hypotensive/hypertensive symptoms -Educated on Daily salt intake goal < 2300 mg; Importance of home blood pressure monitoring; -Counseled to monitor BP at home weekly, document, and provide log at future appointments -Recommended to continue current medication  Hyperlipidemia: (LDL goal < 70) -uncontrolled -Current treatment: . Simvastatin 20 mg nightly  -Medications previously tried: NA  -Educated on Benefits of statin for ASCVD risk reduction; Importance of limiting foods high in cholesterol; -Given patient's high (>20%) cardiovascular risk, patient would be a candidate for a high-intensity statin. Discussed patient-specific risk factors with patient, and he was amenable to changing his statin to a higher potency statin.  -Recommend stopping simvastatin -Recommend start rosuvastatin 20mg  daily  Asthma (Goal: control symptoms and prevent  exacerbations) -Managed by Dr. Meredeth Ide  -controlled -Current treatment  . Dymista 137-60 mcg/act 1 spray twice daily  . Flonase nasal spray  . Advair 250-50 mcg/act 1 puff twice daily  . Monteluakst 10 mg nightly  . ProAir HFA  -Medications previously tried: NA  -Pulmonary function testing: NA -Exacerbations requiring treatment in last 6 months: None -Patient reports consistent use of maintenance inhaler -Frequency of rescue inhaler use: infrequently -Counseled on When to use rescue inhaler -Recommended to continue current  medication  Chronic Pain (Goal: Provide adequate pain relief) -Controlled  -Receives knee injections every 6 months  -Current treatment   . Hydrocodone-APAP 10-325 mg three times daily  . Meloxicam 15 mg daily  -Medications previously tried: NA   -Recommended to continue current medication  Depression/Anxiety (Goal: Maintain stable mood) -Controlled -Current treatment: . Alprazolam 2 mg four times daily  . Buspirone 15 mg 1/2 tablet three times daily  . Venlafaxine XR 75 mg 3 capsules daily  -Medications previously tried/failed: NA -PHQ9: 1 -GAD7: NA -Educated on Benefits of medication for symptom control -Recommended to continue current medication  Patient Goals/Self-Care Activities . Over the next 90 days, patient will:  - check blood pressure weekly, document, and provide at future appointments  Follow Up Plan: Telephone follow up appointment with care management team member scheduled for: 06/01/2021 at 3:00 PM       Patient agreed to services and verbal consent obtained.   The patient verbalized understanding of instructions, educational materials, and care plan provided today and declined offer to receive copy of patient instructions, educational materials, and care plan.   Garey Ham Clinical Pharmacist [facility] [phone]

## 2021-01-03 DIAGNOSIS — J45909 Unspecified asthma, uncomplicated: Secondary | ICD-10-CM | POA: Diagnosis not present

## 2021-01-03 DIAGNOSIS — J449 Chronic obstructive pulmonary disease, unspecified: Secondary | ICD-10-CM | POA: Diagnosis not present

## 2021-01-06 ENCOUNTER — Telehealth: Payer: Self-pay

## 2021-01-06 NOTE — Progress Notes (Signed)
Chronic Care Management Pharmacy Assistant   Name: Clarence Hamilton  MRN: [id number] DOB: 1956-02-08   Reason for Encounter: Disease State/Hypertension     Recent office visits:  N/A  Recent consult visits: N/A  Hospital visits:  None in previous 6 months  Medications: Outpatient Encounter Medications as of 01/06/2021  Medication Sig Note  . alprazolam (XANAX) 2 MG tablet TAKE 1 TABLET BY MOUTH FOUR TIMES DAILY   . Azelastine-Fluticasone (DYMISTA) 137-50 MCG/ACT SUSP Place 1 spray into both nostrils 2 (two) times daily. 02/16/2016: Received from: [facility] Received Sig: Place into the nose.  . busPIRone (BUSPAR) 15 MG tablet TAKE 1/2 TABLET BY MOUTH FOUR TIMES DAILY   . diltiazem (CARDIZEM CD) 240 MG 24 hr capsule Take 1 capsule by mouth daily. bedtime 02/16/2016: Received from: [facility] Received Sig: Take by mouth.  . fluticasone (FLONASE) 50 MCG/ACT nasal spray Place 2 sprays into both nostrils every morning.  02/16/2016: Received from: [facility] Received Sig:   . Fluticasone-Salmeterol (ADVAIR) 250-50 MCG/DOSE AEPB Inhale 1 puff into the lungs 2 (two) times daily.   . furosemide (LASIX) 20 MG tablet Take 1 tablet by mouth daily. 02/16/2016: Received from: [facility] Received Sig: Take by mouth.  . hydrochlorothiazide (HYDRODIURIL) 25 MG tablet Take 1 tablet (25 mg total) by mouth daily.   Marland Kitchen HYDROcodone-acetaminophen (NORCO) 10-325 MG tablet Take 1 tablet by mouth in the morning, at noon, and at bedtime.    Marland Kitchen ketoconazole (NIZORAL) 2 % cream Apply 1 application topically daily.   Marland Kitchen losartan (COZAAR) 100 MG tablet Take 100 mg by mouth daily.   . meloxicam (MOBIC) 15 MG tablet TAKE 1 TABLET(15 MG) BY MOUTH DAILY   . montelukast (SINGULAIR) 10 MG tablet Take 1 tablet by mouth daily. 02/16/2016: Received from: [facility] Received Sig:   . Multiple Vitamins-Minerals (CENTRUM SILVER PO) Take 1  tablet by mouth daily. 02/16/2016: Received from: [facility] Received Sig:   . mupirocin ointment (BACTROBAN) 2 % Apply 1 application topically daily.   . OXYGEN Place 3 L into the nose. At all times   . potassium chloride SA (KLOR-CON) 20 MEQ tablet TAKE 1 TABLET BY MOUTH EVERY DAY   . PROAIR HFA 108 (90 Base) MCG/ACT inhaler Inhale 1 puff into the lungs every 6 (six) hours as needed.  02/17/2016: Received from: External Pharmacy  . simvastatin (ZOCOR) 20 MG tablet TAKE 1 TABLET BY MOUTH EVERY NIGHT AT BEDTIME   . triamcinolone cream (KENALOG) 0.1 % Apply 1 application topically 2 (two) times daily.   Marland Kitchen venlafaxine XR (EFFEXOR-XR) 75 MG 24 hr capsule TAKE 3 CAPSULES(225 MG) BY MOUTH EVERY MORNING    No facility-administered encounter medications on file as of 01/06/2021.    Star Rating Drugs:   Losartan  Simvastatin  Reviewed chart prior to disease state call. Spoke with patient regarding BP  Recent Office Vitals: BP Readings from Last 3 Encounters:  08/19/20 120/75  07/17/20 119/78  07/07/20 140/78   Pulse Readings from Last 3 Encounters:  08/19/20 83  07/17/20 83  07/07/20 93    Wt Readings from Last 3 Encounters:  08/19/20 270 lb (122.5 kg)  07/07/20 269 lb 6.4 oz (122.2 kg)  06/23/20 276 lb 9.6 oz (125.5 kg)     Kidney Function Lab Results  Component Value Date/Time   CREATININE 1.14 02/20/2020 10:37 AM   CREATININE 1.05 10/13/2017 02:47 PM   CREATININE 1.04 03/31/2016 10:20 AM  GFRNONAA 69 02/20/2020 10:37 AM   GFRNONAA 77 10/13/2017 02:47 PM   GFRAA 79 02/20/2020 10:37 AM   GFRAA 89 10/13/2017 02:47 PM    BMP Latest Ref Rng & Units 02/20/2020 10/13/2017 03/31/2016  Glucose 65 - 99 mg/dL 517(G) 89 017(C)  BUN 8 - 27 mg/dL 13 12 13   Creatinine 0.76 - 1.27 mg/dL 9.44 9.67  BUN/Creat Ratio 10 - 24 11 NOT APPLICABLE 13  Sodium 134 - 144 mmol/L 142 142 142  Potassium 3.5 - 5.2 mmol/L 4.1 3.2(L) 3.9  Chloride 96 - 106 mmol/L 98 97(L) 96   CO2 20 - 29 mmol/L 27 37(H) 31(H)  Calcium 8.6 - 10.2 mg/dL 9.3 9.4 9.5    . Current antihypertensive regimen:   Diltiazem CD 240 mg daily   Furosemide 20 mg daily   HCTZ 25 mg daily   Losartan 100 mg daily  . How often are you checking your Blood Pressure? infrequently . Current home BP readings:  o Patient states he is not checking his blood pressure like he should, but he will start.  . What recent interventions/DTPs have been made by any provider to improve Blood Pressure control since last CPP Visit: None ID . Any recent hospitalizations or ED visits since last visit with CPP? No . What diet changes have been made to improve Blood Pressure Control?  o Patient reports he does not add salt to his food. . What exercise is being done to improve your Blood Pressure Control?  o None ID   It was difficult to hear patient due to complications with patient cell phone. Patient states he is in the process of fixing his cell phone.  Adherence Review: Is the patient currently on ACE/ARB medication? Yes Does the patient have >5 day gap between last estimated fill dates? No  5.91 Clinical Clarence Hamilton [phone]

## 2021-02-03 DIAGNOSIS — J449 Chronic obstructive pulmonary disease, unspecified: Secondary | ICD-10-CM | POA: Diagnosis not present

## 2021-02-03 DIAGNOSIS — J45909 Unspecified asthma, uncomplicated: Secondary | ICD-10-CM | POA: Diagnosis not present

## 2021-02-05 DIAGNOSIS — Z79891 Long term (current) use of opiate analgesic: Secondary | ICD-10-CM | POA: Diagnosis not present

## 2021-02-05 DIAGNOSIS — M4726 Other spondylosis with radiculopathy, lumbar region: Secondary | ICD-10-CM | POA: Diagnosis not present

## 2021-02-05 DIAGNOSIS — M5136 Other intervertebral disc degeneration, lumbar region: Secondary | ICD-10-CM | POA: Diagnosis not present

## 2021-02-05 DIAGNOSIS — Z79899 Other long term (current) drug therapy: Secondary | ICD-10-CM | POA: Diagnosis not present

## 2021-02-10 DIAGNOSIS — J310 Chronic rhinitis: Secondary | ICD-10-CM | POA: Diagnosis not present

## 2021-02-10 DIAGNOSIS — R0600 Dyspnea, unspecified: Secondary | ICD-10-CM | POA: Diagnosis not present

## 2021-02-10 DIAGNOSIS — J4530 Mild persistent asthma, uncomplicated: Secondary | ICD-10-CM | POA: Diagnosis not present

## 2021-02-10 DIAGNOSIS — Z01818 Encounter for other preprocedural examination: Secondary | ICD-10-CM | POA: Diagnosis not present

## 2021-02-17 ENCOUNTER — Other Ambulatory Visit: Payer: Self-pay

## 2021-02-17 ENCOUNTER — Ambulatory Visit (INDEPENDENT_AMBULATORY_CARE_PROVIDER_SITE_OTHER): Payer: Medicare Other | Admitting: Family Medicine

## 2021-02-17 VITALS — BP 136/82 | HR 73 | Ht 69.0 in

## 2021-02-17 DIAGNOSIS — I700 Atherosclerosis of aorta: Secondary | ICD-10-CM

## 2021-02-17 DIAGNOSIS — J849 Interstitial pulmonary disease, unspecified: Secondary | ICD-10-CM

## 2021-02-17 DIAGNOSIS — R7303 Prediabetes: Secondary | ICD-10-CM | POA: Diagnosis not present

## 2021-02-17 DIAGNOSIS — E782 Mixed hyperlipidemia: Secondary | ICD-10-CM | POA: Diagnosis not present

## 2021-02-17 DIAGNOSIS — J9611 Chronic respiratory failure with hypoxia: Secondary | ICD-10-CM | POA: Insufficient documentation

## 2021-02-17 DIAGNOSIS — R739 Hyperglycemia, unspecified: Secondary | ICD-10-CM

## 2021-02-17 DIAGNOSIS — I10 Essential (primary) hypertension: Secondary | ICD-10-CM | POA: Diagnosis not present

## 2021-02-17 DIAGNOSIS — Z125 Encounter for screening for malignant neoplasm of prostate: Secondary | ICD-10-CM

## 2021-02-17 LAB — POCT GLYCOSYLATED HEMOGLOBIN (HGB A1C)
Estimated Average Glucose: 120
Hemoglobin A1C: 5.8 % — AB (ref 4.0–5.6)

## 2021-02-17 NOTE — Progress Notes (Signed)
Established patient visit   Patient: Clarence Hamilton   DOB: 10-02-1956   65 y.o. Male  MRN: [id number] Visit Date: 02/17/2021  Today's healthcare provider: Mila Merry, MD   Chief Complaint  Patient presents with  . Hyperglycemia   Subjective    HPI  Hyperglycemia follow up:  Lab Results  Component Value Date   HGBA1C 6.0 (A) 08/19/2020   HGBA1C 6.0 (A) 12/25/2019   HGBA1C 6.6 (H) 10/13/2017   Wt Readings from Last 3 Encounters:  08/19/20 270 lb (122.5 kg)  07/07/20 269 lb 6.4 oz (122.2 kg)  06/23/20 276 lb 9.6 oz (125.5 kg)   Patient was last seen for this 6 months ago. Management since then includes none. Encouraged to exercise but cannot due to knee and back pain. Average of home blood sugar records: n/a Current diet: Regular diet Exercise routine: Unable to due to arthritis      Medications: Outpatient Medications Prior to Visit  Medication Sig  . alprazolam (XANAX) 2 MG tablet TAKE 1 TABLET BY MOUTH FOUR TIMES DAILY  . Azelastine-Fluticasone 137-50 MCG/ACT SUSP Place 1 spray into both nostrils 2 (two) times daily.  . busPIRone (BUSPAR) 15 MG tablet TAKE 1/2 TABLET BY MOUTH FOUR TIMES DAILY  . diltiazem (CARDIZEM CD) 240 MG 24 hr capsule Take 1 capsule by mouth daily. bedtime  . fluticasone (FLONASE) 50 MCG/ACT nasal spray Place 2 sprays into both nostrils every morning.   . Fluticasone-Salmeterol (ADVAIR) 250-50 MCG/DOSE AEPB Inhale 1 puff into the lungs 2 (two) times daily.  . furosemide (LASIX) 20 MG tablet Take 1 tablet by mouth daily.  . hydrochlorothiazide (HYDRODIURIL) 25 MG tablet Take 1 tablet (25 mg total) by mouth daily.  Marland Kitchen HYDROcodone-acetaminophen (NORCO) 10-325 MG tablet Take 1 tablet by mouth in the morning, at noon, and at bedtime.   Marland Kitchen ketoconazole (NIZORAL) 2 % cream Apply 1 application topically daily.  Marland Kitchen losartan (COZAAR) 100 MG tablet Take 100 mg by mouth daily.  . meloxicam (MOBIC) 15 MG tablet TAKE 1 TABLET(15 MG) BY MOUTH DAILY   . montelukast (SINGULAIR) 10 MG tablet Take 1 tablet by mouth daily.  . Multiple Vitamins-Minerals (CENTRUM SILVER PO) Take 1 tablet by mouth daily.  . mupirocin ointment (BACTROBAN) 2 % Apply 1 application topically daily.  . OXYGEN Place 3 L into the nose. At all times  . potassium chloride SA (KLOR-CON) 20 MEQ tablet TAKE 1 TABLET BY MOUTH EVERY DAY  . PROAIR HFA 108 (90 Base) MCG/ACT inhaler Inhale 1 puff into the lungs every 6 (six) hours as needed.   . simvastatin (ZOCOR) 20 MG tablet TAKE 1 TABLET BY MOUTH EVERY NIGHT AT BEDTIME  . triamcinolone cream (KENALOG) 0.1 % Apply 1 application topically 2 (two) times daily.  Marland Kitchen venlafaxine XR (EFFEXOR-XR) 75 MG 24 hr capsule TAKE 3 CAPSULES(225 MG) BY MOUTH EVERY MORNING   No facility-administered medications prior to visit.    Review of Systems  Musculoskeletal: Positive for arthralgias, back pain and joint swelling.       Objective    BP 136/82   Pulse 73   Ht 5\' 9"  (1.753 m)   SpO2 95%   BMI 39.87 kg/m     Physical Exam    General: Appearance:    Obese male in no acute distress  Eyes:    PERRL, conjunctiva/corneas clear, EOM's intact       Lungs:     Clear to auscultation bilaterally, respirations unlabored  Heart:  Normal heart rate. Normal rhythm. No murmurs, rubs, or gallops.   MS:   All extremities are intact.   Neurologic:   Awake, alert, oriented x 3. No apparent focal neurological           defect.         Results for orders placed or performed in visit on 02/17/21  POCT HgB A1C  Result Value Ref Range   Hemoglobin A1C 5.8 (A) 4.0 - 5.6 %   Estimated Average Glucose 120     Assessment & Plan     1. Prediabetes Doing better with diet, a1c trending down.   2. Primary hypertension Fairly well controlled. Continue current medications.    3. Hyperlipidemia, mixed He is tolerating simvastatin well with no adverse effects.   - CBC - Comprehensive metabolic panel - Lipid panel  4. Aortic  atherosclerosis (HCC) Asymptomatic. Compliant with medication.  Continue aggressive risk factor modification.    5. Prostate cancer screening  - PSA Total (Reflex To Free) ([facility] only)  6. Chronic respiratory failure with hypoxia (HCC) Stable on 3 lpm O2. Continue routine follow up Dr. Meredeth Ide  7. ILD (interstitial lung disease) (HCC)  8. Morbid obesity, unspecified obesity type (HCC) Is working on diet but exercise is limited by back and knee pain.       The entirety of the information documented in the History of Present Illness, Review of Systems and Physical Exam were personally obtained by me. Portions of this information were initially documented by the CMA and reviewed by me for thoroughness and accuracy.      Mila Merry, MD  [facility] [phone] (phone) [phone] (fax)  [facility]

## 2021-02-17 NOTE — Patient Instructions (Signed)
.   Please review the attached list of medications and notify my office if there are any errors.   . Please bring all of your medications to every appointment so we can make sure that our medication list is the same as yours.   

## 2021-02-18 ENCOUNTER — Telehealth: Payer: Self-pay

## 2021-02-18 DIAGNOSIS — R972 Elevated prostate specific antigen [PSA]: Secondary | ICD-10-CM

## 2021-02-18 DIAGNOSIS — E782 Mixed hyperlipidemia: Secondary | ICD-10-CM

## 2021-02-18 LAB — CBC
Hematocrit: 41.7 % (ref 37.5–51.0)
Hemoglobin: 13.5 g/dL (ref 13.0–17.7)
MCH: 29.2 pg (ref 26.6–33.0)
MCHC: 32.4 g/dL (ref 31.5–35.7)
MCV: 90 fL (ref 79–97)
Platelets: 311 10*3/uL (ref 150–450)
RBC: 4.63 x10E6/uL (ref 4.14–5.80)
RDW: 13.3 % (ref 11.6–15.4)
WBC: 10.4 10*3/uL (ref 3.4–10.8)

## 2021-02-18 LAB — COMPREHENSIVE METABOLIC PANEL
ALT: 29 IU/L (ref 0–44)
AST: 20 IU/L (ref 0–40)
Albumin/Globulin Ratio: 1.6 (ref 1.2–2.2)
Albumin: 4.4 g/dL (ref 3.8–4.8)
Alkaline Phosphatase: 121 IU/L (ref 44–121)
BUN/Creatinine Ratio: 10 (ref 10–24)
BUN: 14 mg/dL (ref 8–27)
Bilirubin Total: 0.3 mg/dL (ref 0.0–1.2)
CO2: 30 mmol/L — ABNORMAL HIGH (ref 20–29)
Calcium: 9.7 mg/dL (ref 8.6–10.2)
Chloride: 96 mmol/L (ref 96–106)
Creatinine, Ser: 1.39 mg/dL — ABNORMAL HIGH (ref 0.76–1.27)
Globulin, Total: 2.7 g/dL (ref 1.5–4.5)
Glucose: 101 mg/dL — ABNORMAL HIGH (ref 65–99)
Potassium: 4.6 mmol/L (ref 3.5–5.2)
Sodium: 143 mmol/L (ref 134–144)
Total Protein: 7.1 g/dL (ref 6.0–8.5)
eGFR: 57 mL/min/{1.73_m2} — ABNORMAL LOW (ref >59)

## 2021-02-18 LAB — FPSA% REFLEX
% FREE PSA: 11.1 %
PSA, FREE: 1 ng/mL

## 2021-02-18 LAB — PSA TOTAL (REFLEX TO FREE): Prostate Specific Ag, Serum: 9 ng/mL — ABNORMAL HIGH (ref 0.0–4.0)

## 2021-02-18 LAB — LIPID PANEL
Chol/HDL Ratio: 4.6 ratio (ref 0.0–5.0)
Cholesterol, Total: 204 mg/dL — ABNORMAL HIGH (ref 100–199)
HDL: 44 mg/dL (ref >39)
LDL Chol Calc (NIH): 120 mg/dL — ABNORMAL HIGH (ref 0–99)
Triglycerides: 226 mg/dL — ABNORMAL HIGH (ref 0–149)
VLDL Cholesterol Cal: 40 mg/dL (ref 5–40)

## 2021-02-18 MED ORDER — SIMVASTATIN 40 MG PO TABS: 40.0000 mg | ORAL_TABLET | Freq: Every day | ORAL | 2 refills | Status: DC

## 2021-02-18 NOTE — Telephone Encounter (Signed)
I called patient and patient verbalized understanding of results.  Added on simvastatin 40 MG and ambulatory referral for urology.

## 2021-02-18 NOTE — Addendum Note (Signed)
Addended by: Paticia Stack on: 02/18/2021 01:38 PM   Modules accepted: Orders

## 2021-02-18 NOTE — Telephone Encounter (Signed)
Copied from CRM [id number]. Topic: Quick Communication - Lab Results (Clinic Use ONLY) >> Feb 18, 2021 10:45 AM Crist Infante wrote: Pt would like call back about labs.  Please call  [phone] (other phone not working)

## 2021-02-26 DIAGNOSIS — M5416 Radiculopathy, lumbar region: Secondary | ICD-10-CM | POA: Diagnosis not present

## 2021-03-05 DIAGNOSIS — J45909 Unspecified asthma, uncomplicated: Secondary | ICD-10-CM | POA: Diagnosis not present

## 2021-03-05 DIAGNOSIS — J449 Chronic obstructive pulmonary disease, unspecified: Secondary | ICD-10-CM | POA: Diagnosis not present

## 2021-03-10 DIAGNOSIS — M170 Bilateral primary osteoarthritis of knee: Secondary | ICD-10-CM | POA: Diagnosis not present

## 2021-03-12 DIAGNOSIS — G894 Chronic pain syndrome: Secondary | ICD-10-CM | POA: Diagnosis not present

## 2021-03-20 ENCOUNTER — Other Ambulatory Visit: Payer: Self-pay | Admitting: Family Medicine

## 2021-03-20 DIAGNOSIS — I10 Essential (primary) hypertension: Secondary | ICD-10-CM

## 2021-03-20 NOTE — Telephone Encounter (Signed)
  Notes to clinic Looks as if pt has filled this same rx twice in same month, please assess.

## 2021-03-23 ENCOUNTER — Other Ambulatory Visit: Payer: Self-pay | Admitting: Family Medicine

## 2021-03-23 DIAGNOSIS — F419 Anxiety disorder, unspecified: Secondary | ICD-10-CM

## 2021-03-24 NOTE — Telephone Encounter (Signed)
  Notes to clinic:  review for continued use and refill  Last filled on 11/21/2020  Requested Prescriptions  Pending Prescriptions Disp Refills   potassium chloride SA (KLOR-CON) 20 MEQ tablet [Pharmacy Med Name: POTASSIUM CL ER TABLETS] 90 tablet     Sig: TAKE 1 TABLET BY MOUTH EVERY DAY      Endocrinology:  Minerals - Potassium Supplementation Failed - 03/23/2021  6:33 AM      Failed - Cr in normal range and within 360 days    Creat  Date Value Ref Range Status  10/13/2017 1.05 0.70 - 1.25 mg/dL Final    Comment:    For patients >39 years of age, the reference limit for Creatinine is approximately 13% higher for people identified as African-American. .    Creatinine, Ser  Date Value Ref Range Status  02/17/2021 1.39 (H) 0.76 - 1.27 mg/dL Final          Passed - K in normal range and within 360 days    Potassium  Date Value Ref Range Status  02/17/2021 4.6 3.5 - 5.2 mmol/L Final          Passed - Valid encounter within last 12 months    Recent Outpatient Visits           1 month ago Hyperglycemia   [facility] Malva Limes, MD   7 months ago Hyperglycemia   [facility] Malva Limes, MD   8 months ago Cellulitis of left lower extremity   [facility] Osvaldo Angst M, [place]   9 months ago Cellulitis of left lower extremity   [facility] Malva Limes, MD   9 months ago Cellulitis of left lower extremity   [facility] [place], Marzella Schlein, MD       Future Appointments             In 5 months Fisher, Demetrios Isaacs, MD [facility]   In 5 months Fisher, Demetrios Isaacs, MD [facility]              Signed Prescriptions Disp Refills   busPIRone (BUSPAR) 15 MG tablet 180 tablet 1    Sig: TAKE 1/2 TABLET BY MOUTH FOUR TIMES DAILY      Psychiatry: Anxiolytics/Hypnotics - Non-controlled Passed - 03/23/2021  6:33 AM      Passed - Valid encounter  within last 6 months    Recent Outpatient Visits           1 month ago Hyperglycemia   [facility] Malva Limes, MD   7 months ago Hyperglycemia   [facility] Malva Limes, MD   8 months ago Cellulitis of left lower extremity   [facility] Osvaldo Angst M, [place]   9 months ago Cellulitis of left lower extremity   [facility] Malva Limes, MD   9 months ago Cellulitis of left lower extremity   [facility] [place], Marzella Schlein, MD       Future Appointments             In 5 months Fisher, Demetrios Isaacs, MD [facility]   In 5 months Fisher, Demetrios Isaacs, MD [facility], [facility]

## 2021-03-25 ENCOUNTER — Telehealth: Payer: Self-pay | Admitting: Family Medicine

## 2021-03-25 NOTE — Telephone Encounter (Signed)
Patient was referred to urology in April for very high PSA which could be an indication of early prostate cancer. We have not  heard anything from urology. Please check with patient to see if has been in contact with them to schedule appointment. Referral should still be active so he can just call [facility] to make appointment.

## 2021-03-26 DIAGNOSIS — M170 Bilateral primary osteoarthritis of knee: Secondary | ICD-10-CM | POA: Diagnosis not present

## 2021-03-27 NOTE — Telephone Encounter (Signed)
LMTCB 03/27/2021.  PEC please advise pt as below.   Thanks,   -Vernona Rieger

## 2021-03-30 ENCOUNTER — Ambulatory Visit: Payer: Medicare Other

## 2021-03-31 NOTE — Telephone Encounter (Signed)
LMTCB 03/31/2021.  PEC please advise as below.   Thanks,   -  

## 2021-04-01 NOTE — Telephone Encounter (Signed)
Attempted to call patient regarding referral per PCP request. Left message to call office.

## 2021-04-02 ENCOUNTER — Telehealth: Payer: Self-pay | Admitting: *Deleted

## 2021-04-02 NOTE — Telephone Encounter (Signed)
Reviewed physician's referral note with the patient. He hasn't called yet due to injection treatments for his knee. He will call [facility] for appointment b/c of high PSA.

## 2021-04-02 NOTE — Telephone Encounter (Signed)
Attempted to call patient regarding referral/follow up- per provider request. Left message to call office.

## 2021-04-03 ENCOUNTER — Telehealth: Payer: Self-pay

## 2021-04-03 NOTE — Telephone Encounter (Signed)
Patient advised as below. He has an appointment to see Urology on 04/08/2021.

## 2021-04-03 NOTE — Telephone Encounter (Signed)
Copied from CRM [id number]. Topic: General - Other >> Apr 03, 2021  1:51 PM Glean Salen wrote: Reason for CRM:pt needs his medication list he takes be sent to his urologist Dr. Lonna Cobb [place] for his appt for 04/08/2021

## 2021-04-05 DIAGNOSIS — J449 Chronic obstructive pulmonary disease, unspecified: Secondary | ICD-10-CM | POA: Diagnosis not present

## 2021-04-05 DIAGNOSIS — J45909 Unspecified asthma, uncomplicated: Secondary | ICD-10-CM | POA: Diagnosis not present

## 2021-04-06 NOTE — Telephone Encounter (Signed)
Will fax medication record to [facility].

## 2021-04-08 ENCOUNTER — Ambulatory Visit: Payer: Medicare Other | Admitting: Urology

## 2021-04-08 ENCOUNTER — Telehealth: Payer: Self-pay

## 2021-04-08 NOTE — Telephone Encounter (Signed)
FYI: Please review patient message below.

## 2021-04-08 NOTE — Telephone Encounter (Signed)
Copied from CRM [id number]. Topic: General - Other >> Apr 08, 2021  3:11 PM Gaetana Michaelis A wrote: Reason for CRM: Patient has called to share with their PCP that they have rescheduled their appt with Dr. Lonna Cobb for 04/20/21 at 3:30   Please contact to further advsie if needed

## 2021-04-20 ENCOUNTER — Ambulatory Visit (INDEPENDENT_AMBULATORY_CARE_PROVIDER_SITE_OTHER): Payer: Medicare Other | Admitting: Urology

## 2021-04-20 ENCOUNTER — Other Ambulatory Visit: Payer: Self-pay

## 2021-04-20 ENCOUNTER — Encounter: Payer: Self-pay | Admitting: Urology

## 2021-04-20 VITALS — BP 128/76 | HR 76 | Ht 70.0 in | Wt 270.0 lb

## 2021-04-20 DIAGNOSIS — R972 Elevated prostate specific antigen [PSA]: Secondary | ICD-10-CM

## 2021-04-20 DIAGNOSIS — N401 Enlarged prostate with lower urinary tract symptoms: Secondary | ICD-10-CM | POA: Diagnosis not present

## 2021-04-20 MED ORDER — TAMSULOSIN HCL 0.4 MG PO CAPS: 0.4000 mg | ORAL_CAPSULE | Freq: Every day | ORAL | 0 refills | Status: DC

## 2021-04-20 NOTE — Progress Notes (Signed)
04/20/2021 3:44 PM   Clarence Hamilton [birth year] [id number]  Referring provider: Malva Limes, MD [address] [place],  [place] [address]  Chief Complaint  Patient presents with   Elevated PSA    HPI: Clarence Hamilton is a 65 y.o. male referred for evaluation of an elevated PSA.  PSA 02/17/2021 was 9.0 with prior PSA results as below     Had an appointment with Dr. Evelene Croon in 2017 when PSA was 6.5 however patient denies ever having a prior urologic evaluation or prior prostate biopsy He has some postvoid dribbling at nighttime and a weak urinary stream Denies dysuria or gross hematuria Denies flank, abdominal or pelvic pain No recent urinalysis   PMH: Past Medical History:  Diagnosis Date   Anxiety    Arthritis    Asthma    History of chicken pox    Hyperglycemia    Hyperlipidemia    Hypertension    Obesity     Surgical History: Past Surgical History:  Procedure Laterality Date   HERNIA REPAIR  1970's   Inguinal hernia repair x 2    TONSILLECTOMY AND ADENOIDECTOMY  1960's    Home Medications:  Allergies as of 04/20/2021       Reactions   Sulfa Antibiotics    Tetracycline    Headache   Tramadol    insomnia   Corticosteroids    Other reaction(s): Muscle Pain        Medication List        Accurate as of April 20, 2021  3:44 PM. If you have any questions, ask your nurse or doctor.          alprazolam 2 MG tablet Commonly known as: XANAX TAKE 1 TABLET BY MOUTH FOUR TIMES DAILY   Azelastine-Fluticasone 137-50 MCG/ACT Susp Place 1 spray into both nostrils 2 (two) times daily.   busPIRone 15 MG tablet Commonly known as: BUSPAR TAKE 1/2 TABLET BY MOUTH FOUR TIMES DAILY   CENTRUM SILVER PO Take 1 tablet by mouth daily.   diltiazem 240 MG 24 hr capsule Commonly known as: CARDIZEM CD Take 1 capsule by mouth daily. bedtime   fluticasone 50 MCG/ACT nasal spray Commonly known as: FLONASE Place 2 sprays into both nostrils  every morning.   Fluticasone-Salmeterol 250-50 MCG/DOSE Aepb Commonly known as: ADVAIR Inhale 1 puff into the lungs 2 (two) times daily.   furosemide 20 MG tablet Commonly known as: LASIX Take 1 tablet by mouth daily.   hydrochlorothiazide 25 MG tablet Commonly known as: HYDRODIURIL Take 1 tablet (25 mg total) by mouth daily.   HYDROcodone-acetaminophen 10-325 MG tablet Commonly known as: NORCO Take 1 tablet by mouth in the morning, at noon, and at bedtime.   ketoconazole 2 % cream Commonly known as: NIZORAL Apply 1 application topically daily.   losartan 100 MG tablet Commonly known as: COZAAR Take 100 mg by mouth daily.   meloxicam 15 MG tablet Commonly known as: MOBIC TAKE 1 TABLET(15 MG) BY MOUTH DAILY   montelukast 10 MG tablet Commonly known as: SINGULAIR Take 1 tablet by mouth daily.   mupirocin ointment 2 % Commonly known as: BACTROBAN Apply 1 application topically daily.   OXYGEN Place 3 L into the nose. At all times   potassium chloride SA 20 MEQ tablet Commonly known as: KLOR-CON TAKE 1 TABLET BY MOUTH EVERY DAY   ProAir HFA 108 (90 Base) MCG/ACT inhaler Generic drug: albuterol Inhale 1 puff into the lungs every 6 (six)  hours as needed.   simvastatin 20 MG tablet Commonly known as: ZOCOR TAKE 1 TABLET BY MOUTH EVERY NIGHT AT BEDTIME   simvastatin 40 MG tablet Commonly known as: ZOCOR Take 1 tablet (40 mg total) by mouth at bedtime.   triamcinolone cream 0.1 % Commonly known as: KENALOG Apply 1 application topically 2 (two) times daily.   venlafaxine XR 75 MG 24 hr capsule Commonly known as: EFFEXOR-XR TAKE 3 CAPSULES(225 MG) BY MOUTH EVERY MORNING        Allergies:  Allergies  Allergen Reactions   Sulfa Antibiotics    Tetracycline     Headache   Tramadol     insomnia   Corticosteroids     Other reaction(s): Muscle Pain    Family History: Family History  Problem Relation Age of Onset   Hypertension Mother    Breast cancer  Mother    Melanoma Father    Hypertension Father    Parkinson's disease Father    Throat cancer Paternal Grandmother     Social History:  reports that he has never smoked. He has never used smokeless tobacco. He reports that he does not drink alcohol and does not use drugs.   Physical Exam: BP 128/76   Pulse 76   Ht 5\' 10"  (1.778 m)   Wt 270 lb (122.5 kg)   BMI 38.74 kg/m   Constitutional:  Alert and oriented, No acute distress. HEENT: [place] AT, moist mucus membranes.  Trachea midline, no masses. Cardiovascular: No clubbing, cyanosis, or edema. Respiratory: Normal respiratory effort, no increased work of breathing. GI: Abdomen is soft, nontender, nondistended, no abdominal masses GU: Prostate 35 g, smooth without nodules Skin: No rashes, bruises or suspicious lesions. Neurologic: Grossly intact, no focal deficits, moving all 4 extremities. Psychiatric: Normal mood and affect.   Assessment & Plan:    1.  Elevated PSA Although PSA is a prostate cancer screening test he was informed that cancer is not the most common cause of an elevated PSA. Other potential causes including BPH and inflammation were discussed. He was informed that the only way to adequately diagnose prostate cancer would be a transrectal ultrasound and biopsy of the prostate. The procedure was discussed including potential risks of bleeding and infection/sepsis. He was also informed that a negative biopsy does not conclusively rule out the possibility that prostate cancer may be present and that continued monitoring is required. The use of newer adjunctive blood tests including 4kScore was discussed. The use of multiparametric prostate MRI was also discussed.  Urinalysis today showed 6-10 WBCs and urine was cultured PSA was not repeated today due to pyuria Trial tamsulosin 0.4 mg daily Follow-up PSA in 4 weeks   , MD  [facility] [address] [place],  [place] [phone]

## 2021-04-21 ENCOUNTER — Telehealth: Payer: Self-pay | Admitting: *Deleted

## 2021-04-21 LAB — MICROSCOPIC EXAMINATION: Bacteria, UA: NONE SEEN

## 2021-04-21 LAB — URINALYSIS, COMPLETE
Bilirubin, UA: NEGATIVE
Glucose, UA: NEGATIVE
Ketones, UA: NEGATIVE
Leukocytes,UA: NEGATIVE
Nitrite, UA: NEGATIVE
Specific Gravity, UA: 1.02 (ref 1.005–1.030)
Urobilinogen, Ur: 0.2 mg/dL (ref 0.2–1.0)
pH, UA: 5 (ref 5.0–7.5)

## 2021-04-21 NOTE — Telephone Encounter (Signed)
Notified patient as instructed, patient pleased. Discussed follow-up appointments, patient agrees  

## 2021-04-21 NOTE — Telephone Encounter (Signed)
-----   Message from Riki Altes, MD sent at 04/21/2021  3:30 PM EDT ----- Please let patient know that UA show possible infection.  A urine culture was ordered and he will be notified with results.  An Rx for tamsulosin was also sent to his pharmacy which I would go ahead and recommend starting.  PSA follow-up scheduled 1 month

## 2021-04-23 ENCOUNTER — Other Ambulatory Visit: Payer: Self-pay | Admitting: Urology

## 2021-04-23 LAB — CULTURE, URINE COMPREHENSIVE

## 2021-04-23 MED ORDER — AMOXICILLIN 875 MG PO TABS: 875.0000 mg | ORAL_TABLET | Freq: Two times a day (BID) | ORAL | 0 refills | Status: DC

## 2021-04-30 ENCOUNTER — Other Ambulatory Visit: Payer: Self-pay | Admitting: Family Medicine

## 2021-04-30 DIAGNOSIS — F419 Anxiety disorder, unspecified: Secondary | ICD-10-CM

## 2021-05-05 DIAGNOSIS — J45909 Unspecified asthma, uncomplicated: Secondary | ICD-10-CM | POA: Diagnosis not present

## 2021-05-05 DIAGNOSIS — J449 Chronic obstructive pulmonary disease, unspecified: Secondary | ICD-10-CM | POA: Diagnosis not present

## 2021-05-07 DIAGNOSIS — M170 Bilateral primary osteoarthritis of knee: Secondary | ICD-10-CM | POA: Diagnosis not present

## 2021-05-12 ENCOUNTER — Other Ambulatory Visit: Payer: Self-pay | Admitting: *Deleted

## 2021-05-12 DIAGNOSIS — R972 Elevated prostate specific antigen [PSA]: Secondary | ICD-10-CM

## 2021-05-17 ENCOUNTER — Other Ambulatory Visit: Payer: Self-pay | Admitting: Urology

## 2021-05-22 ENCOUNTER — Other Ambulatory Visit: Payer: Self-pay | Admitting: Family Medicine

## 2021-05-22 DIAGNOSIS — I10 Essential (primary) hypertension: Secondary | ICD-10-CM

## 2021-05-22 NOTE — Telephone Encounter (Signed)
Future visit in 3 months  

## 2021-05-25 ENCOUNTER — Other Ambulatory Visit: Payer: Self-pay

## 2021-05-25 ENCOUNTER — Other Ambulatory Visit: Payer: Medicare Other

## 2021-05-25 DIAGNOSIS — R972 Elevated prostate specific antigen [PSA]: Secondary | ICD-10-CM

## 2021-05-26 ENCOUNTER — Telehealth: Payer: Self-pay | Admitting: Urology

## 2021-05-26 LAB — PSA: Prostate Specific Ag, Serum: 8.3 ng/mL — ABNORMAL HIGH (ref 0.0–4.0)

## 2021-05-26 NOTE — Telephone Encounter (Signed)
Notified patient as instructed, Patient states he is taking HYDROcodone-acetaminophen (NORCO) 10-325 MG tablet 3 times a day and he cant get off this medication .

## 2021-05-26 NOTE — Telephone Encounter (Signed)
Repeat PSA remains elevated at 8.3.  Recommend scheduling prostate biopsy

## 2021-05-28 NOTE — Telephone Encounter (Signed)
Patient notified and Biopsy was scheduled. All instructions for Biopsy were given over the phone.

## 2021-05-29 ENCOUNTER — Telehealth: Payer: Self-pay

## 2021-05-29 NOTE — Progress Notes (Signed)
Chronic Care Management Pharmacy Assistant   Name: Clarence Hamilton  MRN: [id number] DOB: 10/27/1955  Reason for Encounter:Hypertension Disease State Call.  Recent office visits:  02/17/2021 Dr.Fisher MD (PCP)   Recent consult visits:  04/23/2021 Dr.Stoioff MD (Urology)  amoxicillin (AMOXIL) 875 MG tablet Take 1 tablet (875 mg total) by mouth every 12 (twelve) hours. - Oral 04/20/2021 Dr.Stoioff MD (Urology) Trial tamsulosin 0.4 mg daily 02/10/2021 Dr.Flemung MD (Pulmonology) F/u in 5 months  Hospital visits:  None in previous 6 months  Medications: Outpatient Encounter Medications as of 05/29/2021  Medication Sig Note   alprazolam (XANAX) 2 MG tablet TAKE 1 TABLET BY MOUTH FOUR TIMES DAILY    amoxicillin (AMOXIL) 875 MG tablet Take 1 tablet (875 mg total) by mouth every 12 (twelve) hours.    Azelastine-Fluticasone 137-50 MCG/ACT SUSP Place 1 spray into both nostrils 2 (two) times daily. 02/16/2016: Received from: [facility] Received Sig: Place into the nose.   busPIRone (BUSPAR) 15 MG tablet TAKE 1/2 TABLET BY MOUTH FOUR TIMES DAILY    diltiazem (CARDIZEM CD) 240 MG 24 hr capsule Take 1 capsule by mouth daily. bedtime 02/16/2016: Received from: [facility] Received Sig: Take by mouth.   fluticasone (FLONASE) 50 MCG/ACT nasal spray Place 2 sprays into both nostrils every morning.  02/16/2016: Received from: [facility] Received Sig:    Fluticasone-Salmeterol (ADVAIR) 250-50 MCG/DOSE AEPB Inhale 1 puff into the lungs 2 (two) times daily.    furosemide (LASIX) 20 MG tablet Take 1 tablet by mouth daily. 02/16/2016: Received from: [facility] Received Sig: Take by mouth.   hydrochlorothiazide (HYDRODIURIL) 25 MG tablet TAKE 1 TABLET BY MOUTH EVERY DAY    HYDROcodone-acetaminophen (NORCO) 10-325 MG tablet Take 1 tablet by mouth in the morning, at noon, and at bedtime.     ketoconazole (NIZORAL) 2 % cream Apply 1  application topically daily.    losartan (COZAAR) 100 MG tablet Take 100 mg by mouth daily.    meloxicam (MOBIC) 15 MG tablet TAKE 1 TABLET(15 MG) BY MOUTH DAILY    montelukast (SINGULAIR) 10 MG tablet Take 1 tablet by mouth daily. 02/16/2016: Received from: [facility] Received Sig:    Multiple Vitamins-Minerals (CENTRUM SILVER PO) Take 1 tablet by mouth daily. 02/16/2016: Received from: [facility] Received Sig:    mupirocin ointment (BACTROBAN) 2 % Apply 1 application topically daily.    OXYGEN Place 3 L into the nose. At all times    potassium chloride SA (KLOR-CON) 20 MEQ tablet TAKE 1 TABLET BY MOUTH EVERY DAY    PROAIR HFA 108 (90 Base) MCG/ACT inhaler Inhale 1 puff into the lungs every 6 (six) hours as needed.  02/17/2016: Received from: External Pharmacy   simvastatin (ZOCOR) 20 MG tablet TAKE 1 TABLET BY MOUTH EVERY NIGHT AT BEDTIME    simvastatin (ZOCOR) 40 MG tablet Take 1 tablet (40 mg total) by mouth at bedtime.    tamsulosin (FLOMAX) 0.4 MG CAPS capsule TAKE 1 CAPSULE(0.4 MG) BY MOUTH DAILY    triamcinolone cream (KENALOG) 0.1 % Apply 1 application topically 2 (two) times daily.    venlafaxine XR (EFFEXOR-XR) 75 MG 24 hr capsule TAKE 3 CAPSULES(225 MG) BY MOUTH EVERY MORNING    No facility-administered encounter medications on file as of 05/29/2021.    Care Gaps: COVID-19 Vaccine Pneumococcal Vaccine HIV Screening Shingrix Vaccine Influenza Vaccine Star Rating Drugs: Simvastatin 20 mg last filled 05/22/2021 90 day supply at [facility] Losartan  100 mg last filled 05/22/2021 90 day supply at [facility]. Medication Fill Gaps: N/A  Reviewed chart prior to disease state call. Spoke with patient regarding BP  Recent Office Vitals: BP Readings from Last 3 Encounters:  04/20/21 128/76  02/17/21 136/82  08/19/20 120/75   Pulse Readings from Last 3 Encounters:  04/20/21 76  02/17/21 73  08/19/20 83    Wt Readings from Last  3 Encounters:  04/20/21 270 lb (122.5 kg)  08/19/20 270 lb (122.5 kg)  07/07/20 269 lb 6.4 oz (122.2 kg)     Kidney Function Lab Results  Component Value Date/Time   CREATININE 1.39 (H) 02/17/2021 04:18 PM   CREATININE 1.14 02/20/2020 10:37 AM   CREATININE 1.05 10/13/2017 02:47 PM   GFRNONAA 69 02/20/2020 10:37 AM   GFRNONAA 77 10/13/2017 02:47 PM   GFRAA 79 02/20/2020 10:37 AM   GFRAA 89 10/13/2017 02:47 PM    BMP Latest Ref Rng & Units 02/17/2021 02/20/2020 10/13/2017  Glucose 65 - 99 mg/dL 237(S) 283(T) 89  BUN 8 - 27 mg/dL 14 13 12   Creatinine 0.76 - 1.27 mg/dL ) 5.17(O 1.60  BUN/Creat Ratio 10 - 24 10 11  NOT APPLICABLE  Sodium 134 - 144 mmol/L 143 142 142  Potassium 3.5 - 5.2 mmol/L 4.6 4.1 3.2(L)  Chloride 96 - 106 mmol/L 96 98 97(L)  CO2 20 - 29 mmol/L 30(H) 27 37(H)  Calcium 8.6 - 10.2 mg/dL 9.7 9.3 9.4    Current antihypertensive regimen:  Diltiazem CD 240 mg daily Furosemide 20 mg daily HCTZ 25 mg daily Losartan 100 mg daily  How often are you checking your Blood Pressure? infrequently Current home BP readings:  Patient states he is not checking his blood pressure like he should, but he will start What recent interventions/DTPs have been made by any provider to improve Blood Pressure control since last CPP Visit: None ID Any recent hospitalizations or ED visits since last visit with CPP? No What diet changes have been made to improve Blood Pressure Control?  Patient reports he does not add salt to his food What exercise is being done to improve your Blood Pressure Control?  None ID  Patient states his bird had died this morning, and he is feeling terrible.Patient reports he took his "nerved pill" and really couldn't talk that long with me because he was waiting on a important call from social services.Patient reports he will try to keep his self together.  Adherence Review: Is the patient currently on ACE/ARB medication? Yes Does the patient have >5 day  gap between last estimated fill dates? No   I reschedule patient telephone  appointment on 06/01/2021 for CCM at 3:00 pm with the Clinical pharmacist to 07/07/2021 at 4:00 pm .  Angelena Sole Clinical Pharmacist Assistant [phone]

## 2021-06-01 ENCOUNTER — Telehealth: Payer: Self-pay

## 2021-06-01 NOTE — Progress Notes (Deleted)
Chronic Care Management Pharmacy Note  06/01/2021 Name:  Clarence Hamilton MRN:  [id number] DOB:  11-Oct-1956  Subjective: Clarence Hamilton is an 65 y.o. year old male who is a primary patient of Fisher, Kirstie Peri, MD.  The CCM team was consulted for assistance with disease management and care coordination needs.    Engaged with patient by telephone for follow up visit in response to provider referral for pharmacy case management and/or care coordination services.   Consent to Services:  The patient was given information about Chronic Care Management services, agreed to services, and gave verbal consent prior to initiation of services.  Please see initial visit note for detailed documentation.   Patient Care Team: Birdie Sons, MD as PCP - General (Family Medicine) Erby Pian, MD as Referring Physician (Pulmonary Disease) Pa, [place] (Optometry) Suella Broad, MD as Consulting Physician (Physical Medicine and Rehabilitation) Latanya Maudlin, MD as Consulting Physician (Orthopedic Surgery) Levy Pupa, PA-C as Physician Assistant ([place]) Germaine Pomfret, [facility] as Pharmacist (Pharmacist)  Recent office visits: 02/17/21: Patient presented to Dr. Caryn Section for follow-up. A1c 5.8%. Cholesterol worsened. Simvastatin incresed to 40 mg daily. eGFR 57 mL/min  08/09/20: Patient presented to Dr. Caryn Section for follow-up. A1c stable at 6.0%. BP 120/75, No medication changes made.   Recent consult visits: 09/16/20: Patient presented to Dr. Raul Del (Pulmonology) to re-establish care. Plan for PFT in 5 months. No medication changes made.   Hospital visits: None in previous 6 months  Objective:  Lab Results  Component Value Date   CREATININE 1.39 (H) 02/17/2021   BUN 14 02/17/2021   GFRNONAA 69 02/20/2020   GFRAA 79 02/20/2020   NA 143 02/17/2021   K 4.6 02/17/2021   CALCIUM 9.7 02/17/2021   CO2 30 (H) 02/17/2021    Lab Results  Component Value  Date/Time   HGBA1C 5.8 (A) 02/17/2021 03:56 PM   HGBA1C 6.0 (A) 08/19/2020 04:33 PM   HGBA1C 6.6 (H) 10/13/2017 02:47 PM   HGBA1C 6.2 (H) 08/30/2016 11:37 AM    Last diabetic Eye exam: No results found for: HMDIABEYEEXA  Last diabetic Foot exam: No results found for: HMDIABFOOTEX   Lab Results  Component Value Date   CHOL 204 (H) 02/17/2021   HDL 44 02/17/2021   LDLCALC 120 (H) 02/17/2021   TRIG 226 (H) 02/17/2021   CHOLHDL 4.6 02/17/2021    Hepatic Function Latest Ref Rng & Units 02/17/2021 02/20/2020 10/13/2017  Total Protein 6.0 - 8.5 g/dL 7.1 6.8 6.6  Albumin 3.8 - 4.8 g/dL 4.4 4.2 -  AST 0 - 40 IU/L 20 25 20   ALT 0 - 44 IU/L 29 25 25   Alk Phosphatase 44 - 121 IU/L 121 109 -  Total Bilirubin 0.0 - 1.2 mg/dL 0.3 0.3 0.4    Lab Results  Component Value Date/Time   TSH 1.200 02/20/2020 10:37 AM   TSH 1.490 03/31/2016 10:20 AM    CBC Latest Ref Rng & Units 02/17/2021 02/20/2020 03/31/2016  WBC 3.4 - 10.8 x10E3/uL 10.4 12.8(H) 9.9  Hemoglobin 13.0 - 17.7 g/dL 13.5 13.1 14.9  Hematocrit 37.5 - 51.0 % 41.7 39.2 44.5  Platelets 150 - 450 x10E3/uL 311 286 278    No results found for: VD25OH  Clinical ASCVD: Yes  The 10-year ASCVD risk score Mikey Bussing DC Jr., et al., 2013) is: 14%   Values used to calculate the score:     Age: 62 years     Sex: Male  Is Non-Hispanic African American: No     Diabetic: No     Tobacco smoker: No     Systolic Blood Pressure: 417 mmHg     Is BP treated: Yes     HDL Cholesterol: 44 mg/dL     Total Cholesterol: 204 mg/dL    Depression screen [facility] 2/9 08/19/2020 12/25/2019 12/02/2017  Decreased Interest 0 0 0  Down, Depressed, Hopeless 0 1 0  PHQ - 2 Score 0 1 0  Altered sleeping 0 - -  Tired, decreased energy 0 - -  Change in appetite 1 - -  Feeling bad or failure about yourself  0 - -  Trouble concentrating 0 - -  Moving slowly or fidgety/restless 0 - -  Suicidal thoughts 0 - -  PHQ-9 Score 1 - -  Difficult doing work/chores Not difficult  at all - -    Social History   Tobacco Use  Smoking Status Never  Smokeless Tobacco Never   BP Readings from Last 3 Encounters:  04/20/21 128/76  02/17/21 136/82  08/19/20 120/75   Pulse Readings from Last 3 Encounters:  04/20/21 76  02/17/21 73  08/19/20 83   Wt Readings from Last 3 Encounters:  04/20/21 270 lb (122.5 kg)  08/19/20 270 lb (122.5 kg)  07/07/20 269 lb 6.4 oz (122.2 kg)    Assessment/Interventions: Review of patient past medical history, allergies, medications, health status, including review of consultants reports, laboratory and other test data, was performed as part of comprehensive evaluation and provision of chronic care management services.   SDOH:  (Social Determinants of Health) assessments and interventions performed: Yes    CCM Care Plan  Allergies  Allergen Reactions   Sulfa Antibiotics    Tetracycline     Headache   Tramadol     insomnia   Corticosteroids     Other reaction(s): Muscle Pain    Medications Reviewed Today     Reviewed by Abbie Sons, MD (Physician) on 04/20/21 at [place] List Status: <None>   Medication Order Taking? Sig Documenting Provider Last Dose Status Informant  alprazolam (XANAX) 2 MG tablet [id number] No TAKE 1 TABLET BY MOUTH FOUR TIMES DAILY Birdie Sons, MD Taking Active   Azelastine-Fluticasone 137-50 MCG/ACT SUSP [id number] No Place 1 spray into both nostrils 2 (two) times daily. [provider] Taking Active            Med Note Josiah Lobo, Deveron Furlong   Mon Feb 16, 2016 12:04 PM) Received from: [place]: Place into the nose.  busPIRone (BUSPAR) 15 MG tablet [id number]  TAKE 1/2 TABLET BY MOUTH FOUR TIMES DAILY Birdie Sons, MD  Active   diltiazem (CARDIZEM CD) 240 MG 24 hr capsule [id number] No Take 1 capsule by mouth daily. bedtime [provider] Taking Active            Med Note Josiah Lobo, ROSHENA L   Mon Feb 16, 2016 12:04 PM) Received from:  [place]: Take by mouth.  fluticasone (FLONASE) 50 MCG/ACT nasal spray [id number] No Place 2 sprays into both nostrils every morning.  [provider] Taking Active            Med Note Josiah Lobo, ROSHENA L   Mon Feb 16, 2016 12:04 PM) Received from: [place]:   Fluticasone-Salmeterol (ADVAIR) 250-50 MCG/DOSE AEPB [id number] No Inhale 1 puff into the lungs 2 (two) times daily. [provider] Taking Active  Self  furosemide (LASIX) 20 MG tablet [id number] No Take 1 tablet by mouth daily. [provider] Taking Active            Med Note Josiah Lobo, Deveron Furlong   Mon Feb 16, 2016 12:04 PM) Received from: [place]: Take by mouth.  hydrochlorothiazide (HYDRODIURIL) 25 MG tablet [id number] No Take 1 tablet (25 mg total) by mouth daily. Birdie Sons, MD Taking Active   HYDROcodone-acetaminophen [facility]) 10-325 MG tablet [id number] No Take 1 tablet by mouth in the morning, at noon, and at bedtime.  [provider] Taking Active   ketoconazole (NIZORAL) 2 % cream [id number] No Apply 1 application topically daily. Birdie Sons, MD Taking Active   losartan (COZAAR) 100 MG tablet [id number] No Take 100 mg by mouth daily. [provider] Taking Active   meloxicam (MOBIC) 15 MG tablet [id number] No TAKE 1 TABLET(15 MG) BY MOUTH DAILY Fisher, Kirstie Peri, MD Taking Active   montelukast (SINGULAIR) 10 MG tablet [id number] No Take 1 tablet by mouth daily. [provider] Taking Active            Med Note Josiah Lobo, Deveron Furlong   Mon Feb 16, 2016 12:04 PM) Received from: [place]:   Multiple Vitamins-Minerals (CENTRUM SILVER PO) [id number] No Take 1 tablet by mouth daily. [provider] Taking Active            Med Note Josiah Lobo, Deveron Furlong   Mon Feb 16, 2016 12:04 PM) Received from: [place]:    mupirocin ointment (BACTROBAN) 2 % [id number] No Apply 1 application topically daily. Virginia Crews, MD Taking Active   OXYGEN [id number] No Place 3 L into the nose. At all times [provider] Taking Active   potassium chloride SA (KLOR-CON) 20 MEQ tablet [id number]  TAKE 1 TABLET BY MOUTH EVERY DAY Birdie Sons, MD  Active   PROAIR HFA 108 [phone] Base) MCG/ACT inhaler [id number] No Inhale 1 puff into the lungs every 6 (six) hours as needed.  [provider] Taking Active            Med Note Sabra Heck, Emmaline Kluver M   Tue Feb 17, 2016  2:20 PM) Received from: External Pharmacy  simvastatin (ZOCOR) 20 MG tablet [id number] No TAKE 1 TABLET BY MOUTH EVERY NIGHT AT BEDTIME Birdie Sons, MD Taking Active   simvastatin (ZOCOR) 40 MG tablet [id number]  Take 1 tablet (40 mg total) by mouth at bedtime. Birdie Sons, MD  Active   tamsulosin [facility]) 0.4 MG CAPS capsule [id number] Yes Take 1 capsule (0.4 mg total) by mouth daily. Abbie Sons, MD  Active   triamcinolone cream (KENALOG) 0.1 % [id number] No Apply 1 application topically 2 (two) times daily. Trinna Post, PA-C Taking Active   venlafaxine XR (EFFEXOR-XR) 75 MG 24 hr capsule [id number] No TAKE 3 CAPSULES(225 MG) BY MOUTH EVERY MORNING Birdie Sons, MD Taking Active             Patient Active Problem List   Diagnosis Date Noted   Chronic respiratory failure with hypoxia ([place]) 02/17/2021   Cellulitis of left lower extremity 06/09/2020   ILD (interstitial lung disease) ([place]) 12/25/2019   Aortic atherosclerosis ([place]) 10/14/2017   Elevated PSA 04/01/2016   Morbid obesity, unspecified obesity type ([place]) 02/16/2016   Dermatitis, stasis 02/06/2015   Edema leg 02/06/2015   Prediabetes 04/30/2014   Hyperlipidemia, mixed 09/05/2008  Hypertension 02/13/2008   Anxiety disorder 10/25/1998   Asthma, intrinsic 10/25/1968    Immunization History  Administered Date(s) Administered   H1N1 08/29/2008   Influenza  Split 10/03/2014   Influenza,inj,Quad PF,6+ Mos 08/19/2020   Influenza-Unspecified 09/07/2017, 07/25/2018, 07/31/2019   Tdap 02/17/2016    Conditions to be addressed/monitored:  Hypertension, Hyperlipidemia, Asthma, Anxiety, Osteoarthritis and Prediabetes  There are no care plans that you recently modified to display for this patient.    Medication Assistance: None required.  Patient affirms current coverage meets needs.  Patient's preferred pharmacy is:  [facility] [place], [place] - [place] [address] [place] [address] Phone: [phone] Fax: [phone]  Uses pill box? Yes Pt endorses 100% compliance  We discussed: Current pharmacy is preferred with insurance plan and patient is satisfied with pharmacy services Patient decided to: Continue current medication management strategy  Care Plan and Follow Up Patient Decision:  Patient agrees to Care Plan and Follow-up.  Plan: Telephone follow up appointment with care management team member scheduled for:  06/01/2021 at 3:00 PM   [facility] [phone]  Current Barriers:  Unable to independently monitor therapeutic efficacy  Pharmacist Clinical Goal(s):  Over the next 90 days, patient will maintain control of blood pressure  as evidenced by BP less than 140/90  through collaboration with PharmD and provider.   Interventions: 1:1 collaboration with Birdie Sons, MD regarding development and update of comprehensive plan of care as evidenced by provider attestation and co-signature Inter-disciplinary care team collaboration (see longitudinal plan of care) Comprehensive medication review performed; medication list updated in electronic medical record  Hypertension (BP goal <140/90) -controlled -Current treatment: Diltiazem CD 240 mg daily  Furosemide 20 mg daily  HCTZ 25  mg daily  Losartan 100 mg daily  -Medications previously tried: NA  -Current home readings: NA,  -Current dietary habits: Does not follow any specific dietary regimen. Does try to limit salt. -Current exercise habits: Does not follow any specific exercise regimen. Patient is limited by osteoarthritis pain.  -Denies hypotensive/hypertensive symptoms -Educated on Daily salt intake goal < 2300 mg; Importance of home blood pressure monitoring; -Counseled to monitor BP at home weekly, document, and provide log at future appointments -Recommended to continue current medication  Hyperlipidemia: (LDL goal < 70) -uncontrolled -Current treatment: Simvastatin 40 mg nightly  -Medications previously tried: NA  -Educated on Benefits of statin for ASCVD risk reduction; Importance of limiting foods high in cholesterol; -Given patient's high (>20%) cardiovascular risk, patient would be a candidate for a high-intensity statin. Discussed patient-specific risk factors with patient, and he was amenable to changing his statin to a higher potency statin.  -Recommend stopping simvastatin -Recommend start rosuvastatin 11m daily  Asthma (Goal: control symptoms and prevent exacerbations) -Managed by Dr. FRaul Del -controlled -Current treatment  Dymista 137-60 mcg/act 1 spray twice daily  Flonase nasal spray  Advair 250-50 mcg/act 1 puff twice daily  Monteluakst 10 mg nightly  ProAir HFA  -Medications previously tried: NA  -Pulmonary function testing: NA -Exacerbations requiring treatment in last 6 months: None -Patient reports consistent use of maintenance inhaler -Frequency of rescue inhaler use: infrequently -Counseled on When to use rescue inhaler -Recommended to continue current medication  Chronic Pain (Goal: Provide adequate pain relief) -Controlled  -Receives knee injections every 6 months  -Current treatment   Hydrocodone-APAP 10-325 mg three times daily  Meloxicam 15 mg daily   -  Medications previously tried: NA   -Recommended to continue current medication  Depression/Anxiety (Goal: Maintain stable mood) -Controlled -Current treatment: Alprazolam 2 mg four times daily  Buspirone 15 mg 1/2 tablet three times daily  Venlafaxine XR 75 mg 3 capsules daily  -Medications previously tried/failed: NA -PHQ9: 1 -GAD7: NA -Educated on Benefits of medication for symptom control -Recommended to continue current medication  Patient Goals/Self-Care Activities Over the next 90 days, patient will:  - check blood pressure weekly, document, and provide at future appointments  Follow Up Plan: ***

## 2021-06-05 DIAGNOSIS — J449 Chronic obstructive pulmonary disease, unspecified: Secondary | ICD-10-CM | POA: Diagnosis not present

## 2021-06-05 DIAGNOSIS — J45909 Unspecified asthma, uncomplicated: Secondary | ICD-10-CM | POA: Diagnosis not present

## 2021-06-11 ENCOUNTER — Encounter: Payer: Medicare Other | Admitting: Urology

## 2021-06-11 ENCOUNTER — Other Ambulatory Visit: Payer: Self-pay

## 2021-06-11 NOTE — Progress Notes (Signed)
Patient was rescheduled

## 2021-06-17 ENCOUNTER — Other Ambulatory Visit: Payer: Self-pay | Admitting: Urology

## 2021-06-18 ENCOUNTER — Telehealth: Payer: Self-pay | Admitting: Family Medicine

## 2021-06-18 DIAGNOSIS — L03116 Cellulitis of left lower limb: Secondary | ICD-10-CM

## 2021-06-18 MED ORDER — TRIAMCINOLONE ACETONIDE 0.1 % EX CREA: 1.0000 "application " | TOPICAL_CREAM | Freq: Two times a day (BID) | CUTANEOUS | 0 refills | Status: DC

## 2021-06-18 NOTE — Telephone Encounter (Signed)
Pt has an appt on 08-25-2021 with dr Sherrie Mustache and would like to know if dr Sherrie Mustache will call in triamcinolone acetonide cream 0.1%. cream. [facility] [address] in [place] phone number [phone]. Pt states this medication help cellulitis on the patient ankle he just prevented one. Please advise

## 2021-06-25 ENCOUNTER — Ambulatory Visit: Payer: Self-pay | Admitting: Urology

## 2021-07-06 ENCOUNTER — Telehealth: Payer: Self-pay

## 2021-07-06 NOTE — Progress Notes (Signed)
APPOINTMENT REMINDER   Clarence Hamilton was reminded to have all medications, supplements and any  blood pressure readings available for review with Angelena Sole , Pharm. D, at his telephone visit on 07/07/2021 at 4:00 pm .  Patient confirm appointment.   Questions: Have you had any recent office visit or specialist visit outside of [facility]? No Are there any concerns you would like to discuss during your office visit? Patient denies any issue or side effects from his current medications.  Everlean Cherry Clinical Pharmacist Assistant [phone]

## 2021-07-07 ENCOUNTER — Ambulatory Visit (INDEPENDENT_AMBULATORY_CARE_PROVIDER_SITE_OTHER): Payer: Medicare Other

## 2021-07-07 DIAGNOSIS — J849 Interstitial pulmonary disease, unspecified: Secondary | ICD-10-CM

## 2021-07-07 DIAGNOSIS — E782 Mixed hyperlipidemia: Secondary | ICD-10-CM

## 2021-07-07 DIAGNOSIS — I700 Atherosclerosis of aorta: Secondary | ICD-10-CM

## 2021-07-07 MED ORDER — ROSUVASTATIN CALCIUM 20 MG PO TABS: 20.0000 mg | ORAL_TABLET | Freq: Every day | ORAL | 1 refills | Status: DC

## 2021-07-07 NOTE — Progress Notes (Signed)
Chronic Care Management Pharmacy Note  07/24/2021 Name:  Clarence Hamilton MRN:  [id number] DOB:  1956-08-05  Summary: Patient presents for CCM follow-up. His cholesterol is uncontrolled despite reported adherence to his simvastatin. He is a candidate for a high-intensity statin.   Recommendations/Changes made from today's visit: Recommend patient get Covid Booster Stop Simvastatin Start Rosuvastatin 20 mg daily   Plan: CPP follow-up 3 months  Subjective: Clarence Hamilton is an 65 y.o. year old male who is a primary patient of Fisher, Kirstie Peri, MD.  The CCM team was consulted for assistance with disease management and care coordination needs.    Engaged with patient by telephone for follow up visit in response to provider referral for pharmacy case management and/or care coordination services.   Consent to Services:  The patient was given information about Chronic Care Management services, agreed to services, and gave verbal consent prior to initiation of services.  Please see initial visit note for detailed documentation.   Patient Care Team: Birdie Sons, MD as PCP - General (Family Medicine) Erby Pian, MD as Referring Physician (Pulmonary Disease) Pa, [place] (Optometry) Suella Broad, MD as Consulting Physician (Physical Medicine and Rehabilitation) Latanya Maudlin, MD as Consulting Physician (Orthopedic Surgery) Levy Pupa, PA-C as Physician Assistant ([place]) Germaine Pomfret, [facility] as Pharmacist (Pharmacist)  Recent office visits: 02/17/21: Patient presented to Dr. Caryn Section for follow-up. A1c stable at 5.8%. PSA elevated. LDL 120.   Recent consult visits: 02/10/21: Patient presented to Dr. Raul Del (Pulmonology) for follow-up.   Hospital visits: None in previous 6 months  Objective:  Lab Results  Component Value Date   CREATININE 1.39 (H) 02/17/2021   BUN 14 02/17/2021   GFRNONAA 69 02/20/2020   GFRAA 79 02/20/2020    NA 143 02/17/2021   K 4.6 02/17/2021   CALCIUM 9.7 02/17/2021   CO2 30 (H) 02/17/2021    Lab Results  Component Value Date/Time   HGBA1C 5.8 (A) 02/17/2021 03:56 PM   HGBA1C 6.0 (A) 08/19/2020 04:33 PM   HGBA1C 6.6 (H) 10/13/2017 02:47 PM   HGBA1C 6.2 (H) 08/30/2016 11:37 AM    Last diabetic Eye exam: No results found for: HMDIABEYEEXA  Last diabetic Foot exam: No results found for: HMDIABFOOTEX   Lab Results  Component Value Date   CHOL 204 (H) 02/17/2021   HDL 44 02/17/2021   LDLCALC 120 (H) 02/17/2021   TRIG 226 (H) 02/17/2021   CHOLHDL 4.6 02/17/2021    Hepatic Function Latest Ref Rng & Units 02/17/2021 02/20/2020 10/13/2017  Total Protein 6.0 - 8.5 g/dL 7.1 6.8 6.6  Albumin 3.8 - 4.8 g/dL 4.4 4.2 -  AST 0 - 40 IU/L 20 25 20   ALT 0 - 44 IU/L 29 25 25   Alk Phosphatase 44 - 121 IU/L 121 109 -  Total Bilirubin 0.0 - 1.2 mg/dL 0.3 0.3 0.4    Lab Results  Component Value Date/Time   TSH 1.200 02/20/2020 10:37 AM   TSH 1.490 03/31/2016 10:20 AM    CBC Latest Ref Rng & Units 02/17/2021 02/20/2020 03/31/2016  WBC 3.4 - 10.8 x10E3/uL 10.4 12.8(H) 9.9  Hemoglobin 13.0 - 17.7 g/dL 13.5 13.1 14.9  Hematocrit 37.5 - 51.0 % 41.7 39.2 44.5  Platelets 150 - 450 x10E3/uL 311 286 278    No results found for: VD25OH  Clinical ASCVD: Yes  The 10-year ASCVD risk score (Arnett DK, et al., 2019) is: 14%   Values used to calculate the score:  Age: 65 years     Sex: Male     Is Non-Hispanic African American: No     Diabetic: No     Tobacco smoker: No     Systolic Blood Pressure: 952 mmHg     Is BP treated: Yes     HDL Cholesterol: 44 mg/dL     Total Cholesterol: 204 mg/dL    Depression screen [facility] 2/9 08/19/2020 12/25/2019 12/02/2017  Decreased Interest 0 0 0  Down, Depressed, Hopeless 0 1 0  PHQ - 2 Score 0 1 0  Altered sleeping 0 - -  Tired, decreased energy 0 - -  Change in appetite 1 - -  Feeling bad or failure about yourself  0 - -  Trouble concentrating 0 - -  Moving  slowly or fidgety/restless 0 - -  Suicidal thoughts 0 - -  PHQ-9 Score 1 - -  Difficult doing work/chores Not difficult at all - -    Social History   Tobacco Use  Smoking Status Never  Smokeless Tobacco Never   BP Readings from Last 3 Encounters:  07/13/21 103/63  04/20/21 128/76  02/17/21 136/82   Pulse Readings from Last 3 Encounters:  07/13/21 89  04/20/21 76  02/17/21 73   Wt Readings from Last 3 Encounters:  07/13/21 280 lb (127 kg)  04/20/21 270 lb (122.5 kg)  08/19/20 270 lb (122.5 kg)    Assessment/Interventions: Review of patient past medical history, allergies, medications, health status, including review of consultants reports, laboratory and other test data, was performed as part of comprehensive evaluation and provision of chronic care management services.   SDOH:  (Social Determinants of Health) assessments and interventions performed: Yes SDOH Interventions    Flowsheet Row Most Recent Value  SDOH Interventions   Financial Strain Interventions Intervention Not Indicated        CCM Care Plan  Allergies  Allergen Reactions   Sulfa Antibiotics    Tetracycline     Headache   Tramadol     insomnia   Corticosteroids     Other reaction(s): Muscle Pain    Medications Reviewed Today     Reviewed by Abbie Sons, MD (Physician) on 07/15/21 at [phone]  Med List Status: <None>   Medication Order Taking? Sig Documenting Provider Last Dose Status Informant  alprazolam (XANAX) 2 MG tablet [id number] Yes TAKE 1 TABLET BY MOUTH FOUR TIMES DAILY Birdie Sons, MD Taking Active   Azelastine-Fluticasone 137-50 MCG/ACT SUSP [id number] Yes Place 1 spray into both nostrils 2 (two) times daily. [provider] Taking Active            Med Note Josiah Lobo, Deveron Furlong   Mon Feb 16, 2016 12:04 PM) Received from: [place]: Place into the nose.  busPIRone (BUSPAR) 15 MG tablet [id number] Yes TAKE 1/2 TABLET BY MOUTH FOUR TIMES  DAILY Birdie Sons, MD Taking Active   diltiazem (CARDIZEM CD) 240 MG 24 hr capsule [id number] Yes Take 1 capsule by mouth daily. bedtime [provider] Taking Active            Med Note Josiah Lobo, ROSHENA L   Mon Feb 16, 2016 12:04 PM) Received from: [place]: Take by mouth.  fluticasone (FLONASE) 50 MCG/ACT nasal spray [id number] Yes Place 2 sprays into both nostrils every morning.  [provider] Taking Active            Med Note (CHAMBERS, ROSHENA L   Mon  Feb 16, 2016 12:04 PM) Received from: [place]:   Fluticasone-Salmeterol (ADVAIR) 250-50 MCG/DOSE AEPB [id number] Yes Inhale 1 puff into the lungs 2 (two) times daily. [provider] Taking Active Self  furosemide (LASIX) 20 MG tablet [id number] Yes Take 1 tablet by mouth daily. [provider] Taking Active            Med Note Josiah Lobo, Deveron Furlong   Mon Feb 16, 2016 12:04 PM) Received from: [place]: Take by mouth.  hydrochlorothiazide (HYDRODIURIL) 25 MG tablet [id number] Yes TAKE 1 TABLET BY MOUTH EVERY DAY Birdie Sons, MD Taking Active   HYDROcodone-acetaminophen [facility]) 10-325 MG tablet [id number] Yes Take 1 tablet by mouth in the morning, at noon, and at bedtime.  [provider] Taking Active   ketoconazole (NIZORAL) 2 % cream [id number] Yes Apply 1 application topically daily. Birdie Sons, MD Taking Active   losartan (COZAAR) 100 MG tablet [id number] Yes Take 100 mg by mouth daily. [provider] Taking Active   meloxicam (MOBIC) 15 MG tablet [id number] Yes TAKE 1 TABLET(15 MG) BY MOUTH DAILY Fisher, Kirstie Peri, MD Taking Active   montelukast (SINGULAIR) 10 MG tablet [id number] Yes Take 1 tablet by mouth daily. [provider] Taking Active            Med Note Josiah Lobo, Deveron Furlong   Mon Feb 16, 2016 12:04 PM) Received from: [place]:    Multiple Vitamins-Minerals (CENTRUM SILVER PO) [id number] Yes Take 1 tablet by mouth daily. [provider] Taking Active            Med Note Josiah Lobo, Deveron Furlong   Mon Feb 16, 2016 12:04 PM) Received from: [place]:   mupirocin ointment (BACTROBAN) 2 % [id number] Yes Apply 1 application topically daily. Virginia Crews, MD Taking Active   OXYGEN [id number] Yes Place 3 L into the nose. At all times [provider] Taking Active   potassium chloride SA (KLOR-CON) 20 MEQ tablet [id number] Yes TAKE 1 TABLET BY MOUTH EVERY DAY Birdie Sons, MD Taking Active   PROAIR HFA 108 [id number] Base) MCG/ACT inhaler [id number] Yes Inhale 1 puff into the lungs every 6 (six) hours as needed.  [provider] Taking Active            Med Note Sabra Heck, Emmaline Kluver M   Tue Feb 17, 2016  2:20 PM) Received from: External Pharmacy  rosuvastatin (CRESTOR) 20 MG tablet [id number] Yes Take 1 tablet (20 mg total) by mouth daily. Birdie Sons, MD Taking Active   tamsulosin [facility]) 0.4 MG CAPS capsule [id number] Yes TAKE 1 CAPSULE(0.4 MG) BY MOUTH DAILY Stoioff, Ronda Fairly, MD Taking Active   triamcinolone cream (KENALOG) 0.1 % [id number] Yes Apply 1 application topically 2 (two) times daily. Birdie Sons, MD Taking Active   venlafaxine XR (EFFEXOR-XR) 75 MG 24 hr capsule [id number] Yes TAKE 3 CAPSULES(225 MG) BY MOUTH EVERY MORNING Birdie Sons, MD Taking Active             Patient Active Problem List   Diagnosis Date Noted   Chronic respiratory failure with hypoxia ([place]) 02/17/2021   Cellulitis of left lower extremity 06/09/2020   ILD (interstitial lung disease) ([place]) 12/25/2019   Aortic atherosclerosis ([place]) 10/14/2017   Elevated PSA 04/01/2016   Morbid obesity, unspecified obesity type ([place]) 02/16/2016   Dermatitis, stasis 02/06/2015   Edema leg 02/06/2015   Prediabetes 04/30/2014  Hyperlipidemia, mixed 09/05/2008   Hypertension 02/13/2008    Anxiety disorder 10/25/1998   Asthma, intrinsic 10/25/1968    Immunization History  Administered Date(s) Administered   H1N1 08/29/2008   Influenza Split 10/03/2014   Influenza,inj,Quad PF,6+ Mos 08/19/2020   Influenza-Unspecified 09/07/2017, 07/25/2018, 07/31/2019   Tdap 02/17/2016    Conditions to be addressed/monitored:  Hypertension, Hyperlipidemia, Asthma, Anxiety, Osteoarthritis and Prediabetes  Care Plan : General Pharmacy (Adult)  Updates made by Germaine Pomfret, RPH since 07/24/2021 12:00 AM     Problem: Hypertension, Hyperlipidemia, Asthma, Anxiety, Osteoarthritis and Prediabetes   Priority: High     Long-Range Goal: Patient-Specific Goal   Start Date: 12/17/2020  Expected End Date: 07/24/2022  This Visit's Progress: On track  Recent Progress: On track  Priority: High  Note:   Current Barriers:  Unable to independently monitor therapeutic efficacy  Pharmacist Clinical Goal(s):  Over the next 90 days, patient will maintain control of blood pressure  as evidenced by BP less than 140/90  through collaboration with PharmD and provider.   Interventions: 1:1 collaboration with Birdie Sons, MD regarding development and update of comprehensive plan of care as evidenced by provider attestation and co-signature Inter-disciplinary care team collaboration (see longitudinal plan of care) Comprehensive medication review performed; medication list updated in electronic medical record  Hypertension (BP goal <140/90) -controlled -Current treatment: Diltiazem CD 240 mg daily  Furosemide 20 mg daily  HCTZ 25 mg daily  Losartan 100 mg daily  -Medications previously tried: NA  -Current home readings: NA,  -Current dietary habits: Does not follow any specific dietary regimen. Does try to limit salt. -Current exercise habits: Does not follow any specific exercise regimen. Patient is limited by osteoarthritis pain.  -Denies hypotensive/hypertensive symptoms -Educated on  Daily salt intake goal < 2300 mg; Importance of home blood pressure monitoring; -Counseled to monitor BP at home weekly, document, and provide log at future appointments -Recommended to continue current medication  Hyperlipidemia: (LDL goal < 70) -uncontrolled -Current treatment: Simvastatin 20 mg nightly  -Medications previously tried: NA  -Educated on Benefits of statin for ASCVD risk reduction; Importance of limiting foods high in cholesterol; -Given patient's high (>20%) cardiovascular risk, patient would be a candidate for a high-intensity statin. Discussed patient-specific risk factors with patient, and he was amenable to changing his statin to a higher potency statin.  -Recommend stopping simvastatin -Recommend start rosuvastatin 27m daily  Asthma/ Interstitial Lung Disease/ Chronic Respiratory Failure (Goal: control symptoms and prevent exacerbations) -Managed by Dr. FRaul Del -controlled -Current treatment  Dymista 137-60 mcg/act 1 spray twice daily  Flonase nasal spray  Advair 250-50 mcg/act 1 puff twice daily  Monteluakst 10 mg nightly  ProAir HFA  Oxygen 3L -Medications previously tried: NA  -Received Covid vaccines (twice) , unsure of dates  -Pulmonary function testing: NA -Exacerbations requiring treatment in last 6 months: None -Patient reports consistent use of maintenance inhaler -Frequency of rescue inhaler use: infrequently -Counseled on When to use rescue inhaler -Recommend Seasonal Covid-19 Booster  -Recommended to continue current medication  Chronic Pain (Goal: Provide adequate pain relief) -Controlled  -Receives knee injections every 6 months  -Current treatment   Hydrocodone-APAP 10-325 mg three times daily  Meloxicam 15 mg daily  -Medications previously tried: NA   -Recommended to continue current medication  Depression/Anxiety (Goal: Maintain stable mood) -Controlled -Current treatment: Alprazolam 2 mg four times daily  Buspirone 15 mg 1/2  tablet three times daily  Venlafaxine XR 75 mg 3 capsules daily  -Medications  previously tried/failed: NA -PHQ9: 1 -GAD7: NA -Educated on Benefits of medication for symptom control -Recommended to continue current medication  Patient Goals/Self-Care Activities Over the next 90 days, patient will:  - check blood pressure weekly, document, and provide at future appointments  Follow Up Plan: Telephone follow up appointment with care management team member scheduled for:  10/06/21 at 3:45 PM       Medication Assistance: None required.  Patient affirms current coverage meets needs.  Patient's preferred pharmacy is:  [facility] [place], [place] - [place] [address] [place] [address] Phone: [phone] Fax: [phone]  Uses pill box? Yes Pt endorses 100% compliance  We discussed: Current pharmacy is preferred with insurance plan and patient is satisfied with pharmacy services Patient decided to: Continue current medication management strategy  Care Plan and Follow Up Patient Decision:  Patient agrees to Care Plan and Follow-up.  Plan: Telephone follow up appointment with care management team member scheduled for:  10/06/21 at 3:45 PM  [place] [phone]

## 2021-07-13 ENCOUNTER — Other Ambulatory Visit: Payer: Self-pay

## 2021-07-13 ENCOUNTER — Encounter: Payer: Self-pay | Admitting: Urology

## 2021-07-13 ENCOUNTER — Ambulatory Visit (INDEPENDENT_AMBULATORY_CARE_PROVIDER_SITE_OTHER): Payer: Medicare Other | Admitting: Urology

## 2021-07-13 VITALS — BP 103/63 | HR 89 | Ht 70.0 in | Wt 280.0 lb

## 2021-07-13 DIAGNOSIS — R972 Elevated prostate specific antigen [PSA]: Secondary | ICD-10-CM

## 2021-07-13 MED ORDER — LEVOFLOXACIN 500 MG PO TABS
500.0000 mg | ORAL_TABLET | Freq: Once | ORAL | Status: AC
  Administered 2021-07-13: 500 mg via ORAL

## 2021-07-13 MED ORDER — GENTAMICIN SULFATE 40 MG/ML IJ SOLN
80.0000 mg | Freq: Once | INTRAMUSCULAR | Status: AC
Start: 2021-07-13 — End: 2021-07-13
  Administered 2021-07-13: 80 mg via INTRAMUSCULAR

## 2021-07-13 MED ORDER — GENTAMICIN SULFATE 40 MG/ML IJ SOLN
80.0000 mg | Freq: Once | INTRAMUSCULAR | Status: AC
  Administered 2021-07-13: 80 mg via INTRAMUSCULAR

## 2021-07-13 NOTE — Progress Notes (Signed)
Prostate Biopsy Procedure   Informed consent was obtained after discussing risks/benefits of the procedure.  A time out was performed to ensure correct patient identity.  Pre-Procedure: - Last PSA Level: 02/17/2021 9.0; repeat 05/25/2021 8.3 - Gentamicin given prophylactically - Levaquin 500 mg administered PO -Transrectal Ultrasound performed revealing a 83 gm prostate -No significant hypoechoic or median lobe noted  Procedure: - Prostate block performed using 10 cc 1% lidocaine and biopsies taken from sextant areas, a total of 12 under ultrasound guidance.  Post-Procedure: - Patient tolerated the procedure well - He was counseled to seek immediate medical attention if experiences any severe pain, significant bleeding, or fevers - Return in one week to discuss biopsy results   Irineo Axon, MD

## 2021-07-15 ENCOUNTER — Encounter: Payer: Self-pay | Admitting: Urology

## 2021-07-16 ENCOUNTER — Telehealth: Payer: Self-pay | Admitting: Urology

## 2021-07-16 LAB — SURGICAL PATHOLOGY

## 2021-07-16 NOTE — Telephone Encounter (Signed)
Please inform Mr. Clarence Hamilton his prostate biopsy showed no evidence of cancer.  If he is not having any postbiopsy problems can cancel his follow-up appointment 9/30 and reschedule for a 73-month follow-up appointment with PSA prior.

## 2021-07-17 NOTE — Telephone Encounter (Signed)
Notified patient as instructed, patient pleased. Discussed follow-up appointments, patient agrees  

## 2021-07-24 ENCOUNTER — Ambulatory Visit: Payer: Medicare Other | Admitting: Urology

## 2021-07-24 DIAGNOSIS — E782 Mixed hyperlipidemia: Secondary | ICD-10-CM

## 2021-07-24 NOTE — Patient Instructions (Signed)
Visit Information It was great speaking with you today!  Please let me know if you have any questions about our visit.   Goals Addressed             This Visit's Progress    Track and Manage My Blood Pressure-Hypertension       Timeframe:  Long-Range Goal Priority:  High Start Date: 07/24/21                             Expected End Date: 07/24/22                       Follow Up within 90 days   - check blood pressure weekly - write blood pressure results in a log or diary    Why is this important?   You won't feel high blood pressure, but it can still hurt your blood vessels.  High blood pressure can cause heart or kidney problems. It can also cause a stroke.  Making lifestyle changes like losing a little weight or eating less salt will help.  Checking your blood pressure at home and at different times of the day can help to control blood pressure.  If the doctor prescribes medicine remember to take it the way the doctor ordered.  Call the office if you cannot afford the medicine or if there are questions about it.     Notes:      Track and Manage My Symptoms-Asthma   On track    Timeframe:  Long-Range Goal Priority:  High Start Date:   12/16/2020                          Expected End Date:  06/01/2022                     Follow Up within 90 days   - avoid symptom triggers outdoors - follow asthma action plan - keep rescue medicines on hand    Why is this important?   Keeping track of asthma symptoms can tell you a lot about your asthma control.  Based on symptoms and peak flow results you can see how well you are doing.  Your asthma action plan has a green, yellow and red zone. Green means all is good; it is your goal. Yellow means your symptoms are a little worse. You will need to adjust your medications. Being in the red zone means that your   asthma is out of control. You will need to use your rescue medicines. You may need emergency care.     Notes:          Patient Care Plan: General Pharmacy (Adult)     Problem Identified: Hypertension, Hyperlipidemia, Asthma, Anxiety, Osteoarthritis and Prediabetes   Priority: High     Long-Range Goal: Patient-Specific Goal   Start Date: 12/17/2020  Expected End Date: 07/24/2022  This Visit's Progress: On track  Recent Progress: On track  Priority: High  Note:   Current Barriers:  Unable to independently monitor therapeutic efficacy  Pharmacist Clinical Goal(s):  Over the next 90 days, patient will maintain control of blood pressure  as evidenced by BP less than 140/90  through collaboration with PharmD and provider.   Interventions: 1:1 collaboration with Malva Limes, MD regarding development and update of comprehensive plan of care as evidenced by provider attestation and co-signature Inter-disciplinary care  team collaboration (see longitudinal plan of care) Comprehensive medication review performed; medication list updated in electronic medical record  Hypertension (BP goal <140/90) -controlled -Current treatment: Diltiazem CD 240 mg daily  Furosemide 20 mg daily  HCTZ 25 mg daily  Losartan 100 mg daily  -Medications previously tried: NA  -Current home readings: NA,  -Current dietary habits: Does not follow any specific dietary regimen. Does try to limit salt. -Current exercise habits: Does not follow any specific exercise regimen. Patient is limited by osteoarthritis pain.  -Denies hypotensive/hypertensive symptoms -Educated on Daily salt intake goal < 2300 mg; Importance of home blood pressure monitoring; -Counseled to monitor BP at home weekly, document, and provide log at future appointments -Recommended to continue current medication  Hyperlipidemia: (LDL goal < 70) -uncontrolled -Current treatment: Simvastatin 20 mg nightly  -Medications previously tried: NA  -Educated on Benefits of statin for ASCVD risk reduction; Importance of limiting foods high in  cholesterol; -Given patient's high (>20%) cardiovascular risk, patient would be a candidate for a high-intensity statin. Discussed patient-specific risk factors with patient, and he was amenable to changing his statin to a higher potency statin.  -Recommend stopping simvastatin -Recommend start rosuvastatin 20mg  daily  Asthma/ Interstitial Lung Disease/ Chronic Respiratory Failure (Goal: control symptoms and prevent exacerbations) -Managed by Dr.  -controlled -Current treatment  Dymista 137-60 mcg/act 1 spray twice daily  Flonase nasal spray  Advair 250-50 mcg/act 1 puff twice daily  Monteluakst 10 mg nightly  ProAir HFA  Oxygen 3L -Medications previously tried: NA  -Received Covid vaccines (twice) , unsure of dates  -Pulmonary function testing: NA -Exacerbations requiring treatment in last 6 months: None -Patient reports consistent use of maintenance inhaler -Frequency of rescue inhaler use: infrequently -Counseled on When to use rescue inhaler -Recommend Seasonal Covid-19 Booster  -Recommended to continue current medication  Chronic Pain (Goal: Provide adequate pain relief) -Controlled  -Receives knee injections every 6 months  -Current treatment   Hydrocodone-APAP 10-325 mg three times daily  Meloxicam 15 mg daily  -Medications previously tried: NA   -Recommended to continue current medication  Depression/Anxiety (Goal: Maintain stable mood) -Controlled -Current treatment: Alprazolam 2 mg four times daily  Buspirone 15 mg 1/2 tablet three times daily  Venlafaxine XR 75 mg 3 capsules daily  -Medications previously tried/failed: NA -PHQ9: 1 -GAD7: NA -Educated on Benefits of medication for symptom control -Recommended to continue current medication  Patient Goals/Self-Care Activities Over the next 90 days, patient will:  - check blood pressure weekly, document, and provide at future appointments  Follow Up Plan: Telephone follow up appointment with care  management team member scheduled for:  10/06/21 at 3:45 PM      Patient agreed to services and verbal consent obtained.   The patient verbalized understanding of instructions, educational materials, and care plan provided today and declined offer to receive copy of patient instructions, educational materials, and care plan.   10/08/21, PharmD, Angelena Sole, CPP  Clinical Pharmacist [facility] [phone]

## 2021-08-15 ENCOUNTER — Other Ambulatory Visit: Payer: Self-pay | Admitting: Family Medicine

## 2021-08-15 DIAGNOSIS — I10 Essential (primary) hypertension: Secondary | ICD-10-CM

## 2021-08-21 ENCOUNTER — Other Ambulatory Visit: Payer: Self-pay | Admitting: Family Medicine

## 2021-08-21 DIAGNOSIS — F419 Anxiety disorder, unspecified: Secondary | ICD-10-CM

## 2021-08-22 NOTE — Telephone Encounter (Signed)
Requested medication (s) are due for refill today:due 08/31/21  Requested medication (s) are on the active medication list:yes  Last refill: 05/31/21  90  0refills  Future visit scheduled yes 08/25/21  Notes to clinic: not delegated  Requested Prescriptions  Pending Prescriptions Disp Refills   alprazolam (XANAX) 2 MG tablet [Pharmacy Med Name: ALPRAZOLAM 2MG  TABLETS] 120 tablet     Sig: TAKE 1 TABLET BY MOUTH FOUR TIMES DAILY     Not Delegated - Psychiatry:  Anxiolytics/Hypnotics Failed - 08/21/2021 11:49 PM      Failed - This refill cannot be delegated      Failed - Urine Drug Screen completed in last 360 days      Failed - Valid encounter within last 6 months    Recent Outpatient Visits           6 months ago Hyperglycemia   [facility], MD   1 year ago Hyperglycemia   [facility], MD   1 year ago Cellulitis of left lower extremity   [facility] M, M   1 year ago Cellulitis of left lower extremity   [facility], MD   1 year ago Cellulitis of left lower extremity   [facility], [facility], MD       Future Appointments             In 3 days Fisher, Marzella Schlein, MD [facility]   In 4 months Stoioff, [facility], MD [facility]

## 2021-08-24 ENCOUNTER — Ambulatory Visit: Payer: Self-pay | Admitting: Family Medicine

## 2021-08-25 ENCOUNTER — Encounter: Payer: Self-pay | Admitting: Family Medicine

## 2021-08-25 ENCOUNTER — Other Ambulatory Visit: Payer: Self-pay

## 2021-08-25 ENCOUNTER — Ambulatory Visit (INDEPENDENT_AMBULATORY_CARE_PROVIDER_SITE_OTHER): Payer: Medicare Other | Admitting: Family Medicine

## 2021-08-25 VITALS — BP 114/68 | HR 93 | Temp 99.8°F | Resp 20

## 2021-08-25 DIAGNOSIS — F419 Anxiety disorder, unspecified: Secondary | ICD-10-CM

## 2021-08-25 DIAGNOSIS — Z23 Encounter for immunization: Secondary | ICD-10-CM | POA: Diagnosis not present

## 2021-08-25 DIAGNOSIS — I10 Essential (primary) hypertension: Secondary | ICD-10-CM | POA: Diagnosis not present

## 2021-08-25 DIAGNOSIS — E782 Mixed hyperlipidemia: Secondary | ICD-10-CM | POA: Diagnosis not present

## 2021-08-25 DIAGNOSIS — R7303 Prediabetes: Secondary | ICD-10-CM

## 2021-08-25 DIAGNOSIS — R609 Edema, unspecified: Secondary | ICD-10-CM

## 2021-08-25 MED ORDER — FUROSEMIDE 20 MG PO TABS: 20.0000 mg | ORAL_TABLET | Freq: Every day | ORAL | 5 refills | Status: DC | PRN

## 2021-08-25 MED ORDER — ALPRAZOLAM 2 MG PO TABS: 2.0000 mg | ORAL_TABLET | Freq: Four times a day (QID) | ORAL | 5 refills | Status: DC

## 2021-08-25 NOTE — Progress Notes (Signed)
Established patient visit   Patient: Clarence Hamilton   DOB: Oct 02, 1956   65 y.o. Male  MRN: [id number] Visit Date: 08/25/2021  Today's healthcare provider: Mila Merry, MD   Chief Complaint  Patient presents with   Prediabetes   Hypertension   Hyperlipidemia   Subjective    HPI  Prediabetes, Follow-up  Lab Results  Component Value Date   HGBA1C 5.8 (A) 02/17/2021   HGBA1C 6.0 (A) 08/19/2020   HGBA1C 6.0 (A) 12/25/2019   GLUCOSE 101 (H) 02/17/2021   GLUCOSE 117 (H) 02/20/2020   GLUCOSE 89 10/13/2017    Last seen for for this 6 months ago.  Management since that visit includes no changes. Current symptoms include none and have been stable.   Pertinent Labs:    Component Value Date/Time   CHOL 204 (H) 02/17/2021 1618   TRIG 226 (H) 02/17/2021 1618   CHOLHDL 4.6 02/17/2021 1618   CHOLHDL 3.1 10/13/2017 1447   CREATININE 1.39 (H) 02/17/2021 1618   CREATININE 1.05 10/13/2017 1447    Wt Readings from Last 3 Encounters:  07/13/21 280 lb (127 kg)  04/20/21 270 lb (122.5 kg)  08/19/20 270 lb (122.5 kg)    ----------------------------------------------------------------------------------------- Hypertension, follow-up  BP Readings from Last 3 Encounters:  08/25/21 114/68  07/13/21 103/63  04/20/21 128/76   Wt Readings from Last 3 Encounters:  07/13/21 280 lb (127 kg)  04/20/21 270 lb (122.5 kg)  08/19/20 270 lb (122.5 kg)     He was last seen for hypertension 6 months ago.  BP at that visit was 136/82. Management since that visit includes no changes.  He reports fair compliance with treatment. He is not having side effects.  He is following a Regular diet. He is not exercising. He does not smoke.  Use of agents associated with hypertension: none.   Outside blood pressures are not checked.  --------------------------------------------------------------------------------------------------- Lipid/Cholesterol, Follow-up  Last lipid panel  Other pertinent labs  Lab Results  Component Value Date   CHOL 204 (H) 02/17/2021   HDL 44 02/17/2021   LDLCALC 120 (H) 02/17/2021   TRIG 226 (H) 02/17/2021   CHOLHDL 4.6 02/17/2021   Lab Results  Component Value Date   ALT 29 02/17/2021   AST 20 02/17/2021   PLT 311 02/17/2021   TSH 1.200 02/20/2020     He was last seen for this 6 months ago.  Management after that visit includes increasing simvastatin to 40mg  daily. Since that visit, simvastatin was stopped and changed to rosuvastatin 20mg  daily.   He reports good compliance with treatment. He is not having side effects.   Symptoms: No chest pain No chest pressure/discomfort  No dyspnea No lower extremity edema  No numbness or tingling of extremity No orthopnea  No palpitations No paroxysmal nocturnal dyspnea  No speech difficulty No syncope   The 10-year ASCVD risk score (Arnett DK, et al., 2019) is: 12.4%  ---------------------------------------------------------------------------------------------------      Medications: Outpatient Medications Prior to Visit  Medication Sig   alprazolam (XANAX) 2 MG tablet TAKE 1 TABLET BY MOUTH FOUR TIMES DAILY   Azelastine-Fluticasone 137-50 MCG/ACT SUSP Place 1 spray into both nostrils 2 (two) times daily.   busPIRone (BUSPAR) 15 MG tablet TAKE 1/2 TABLET BY MOUTH FOUR TIMES DAILY   Fluticasone-Salmeterol (ADVAIR) 250-50 MCG/DOSE AEPB Inhale 1 puff into the lungs 2 (two) times daily.   hydrochlorothiazide (HYDRODIURIL) 25 MG tablet TAKE 1 TABLET BY MOUTH EVERY DAY   HYDROcodone-acetaminophen (NORCO)  10-325 MG tablet Take 1 tablet by mouth in the morning, at noon, and at bedtime.    meloxicam (MOBIC) 15 MG tablet TAKE 1 TABLET(15 MG) BY MOUTH DAILY   montelukast (SINGULAIR) 10 MG tablet Take 1 tablet by mouth daily.   Multiple Vitamins-Minerals (CENTRUM SILVER PO) Take 1 tablet by mouth daily.   mupirocin ointment (BACTROBAN) 2 % Apply 1 application topically daily.   OXYGEN  Place 3 L into the nose. At all times   potassium chloride SA (KLOR-CON) 20 MEQ tablet TAKE 1 TABLET BY MOUTH EVERY DAY   PROAIR HFA 108 (90 Base) MCG/ACT inhaler Inhale 1 puff into the lungs every 6 (six) hours as needed.    rosuvastatin (CRESTOR) 20 MG tablet Take 1 tablet (20 mg total) by mouth daily.   tamsulosin (FLOMAX) 0.4 MG CAPS capsule TAKE 1 CAPSULE(0.4 MG) BY MOUTH DAILY   triamcinolone cream (KENALOG) 0.1 % Apply 1 application topically 2 (two) times daily.   venlafaxine XR (EFFEXOR-XR) 75 MG 24 hr capsule TAKE 3 CAPSULES(225 MG) BY MOUTH EVERY MORNING   diltiazem (CARDIZEM CD) 240 MG 24 hr capsule Take 1 capsule by mouth daily. bedtime   fluticasone (FLONASE) 50 MCG/ACT nasal spray Place 2 sprays into both nostrils every morning.    furosemide (LASIX) 20 MG tablet Take 1 tablet by mouth daily.   ketoconazole (NIZORAL) 2 % cream Apply 1 application topically daily.   losartan (COZAAR) 100 MG tablet Take 100 mg by mouth daily.   No facility-administered medications prior to visit.    Review of Systems  Constitutional:  Negative for appetite change, chills and fever.  Respiratory:  Negative for chest tightness, shortness of breath and wheezing.   Cardiovascular:  Negative for chest pain and palpitations.  Gastrointestinal:  Negative for abdominal pain, nausea and vomiting.  Neurological:  Positive for tremors (in hands) and weakness (not able to walk long distances).      Objective    BP 114/68 (BP Location: Left Arm, Patient Position: Sitting, Cuff Size: Large)   Pulse 93   Temp 99.8 F (37.7 C) (Oral)   Resp 20   SpO2 99% Comment: 3 L02 nasal canula   Physical Exam   General: Appearance:    Obese male in no acute distress  Eyes:    PERRL, conjunctiva/corneas clear, EOM's intact       Lungs:     Clear to auscultation bilaterally, respirations unlabored  Heart:    Normal heart rate. Normal rhythm. No murmurs, rubs, or gallops.    MS:   All extremities are intact.   2+ bilateral lower leg edema.   Neurologic:   Awake, alert, oriented x 3. No apparent focal neurological defect.          Assessment & Plan     1. Primary hypertension Well controlled.  Continue current medications.    2. Hyperlipidemia, mixed He is tolerating rosuvastatin well with no adverse effects.   - Comprehensive metabolic panel - Lipid panel  3. Prediabetes  - Hemoglobin A1c  4. Need for influenza vaccination  - Flu Vaccine QUAD 36+ mos IM (Fluarix/Fluzone)  5. Anxiety disorder refill - alprazolam (XANAX) 2 MG tablet; Take 1 tablet (2 mg total) by mouth 4 (four) times daily.  Dispense: 120 tablet; Refill: 5  6. Edema  Is out of furosemide, refill furosemide (LASIX) 20 MG tablet; Take 1 tablet (20 mg total) by mouth daily as needed for fluid.  Dispense: 30 tablet; Refill: 5  The entirety of the information documented in the History of Present Illness, Review of Systems and Physical Exam were personally obtained by me. Portions of this information were initially documented by the CMA and reviewed by me for thoroughness and accuracy.     Lelon Huh, MD  [facility] [phone] (phone) [phone] (fax)  [place]

## 2021-08-26 LAB — COMPREHENSIVE METABOLIC PANEL
ALT: 29 IU/L (ref 0–44)
AST: 17 IU/L (ref 0–40)
Albumin/Globulin Ratio: 1.8 (ref 1.2–2.2)
Albumin: 4.3 g/dL (ref 3.8–4.8)
Alkaline Phosphatase: 101 IU/L (ref 44–121)
BUN/Creatinine Ratio: 18 (ref 10–24)
BUN: 24 mg/dL (ref 8–27)
Bilirubin Total: 0.2 mg/dL (ref 0.0–1.2)
CO2: 30 mmol/L — ABNORMAL HIGH (ref 20–29)
Calcium: 9.2 mg/dL (ref 8.6–10.2)
Chloride: 98 mmol/L (ref 96–106)
Creatinine, Ser: 1.3 mg/dL — ABNORMAL HIGH (ref 0.76–1.27)
Globulin, Total: 2.4 g/dL (ref 1.5–4.5)
Glucose: 125 mg/dL — ABNORMAL HIGH (ref 70–99)
Potassium: 3.7 mmol/L (ref 3.5–5.2)
Sodium: 141 mmol/L (ref 134–144)
Total Protein: 6.7 g/dL (ref 6.0–8.5)
eGFR: 61 mL/min/{1.73_m2} (ref >59)

## 2021-08-26 LAB — LIPID PANEL
Chol/HDL Ratio: 3.4 ratio (ref 0.0–5.0)
Cholesterol, Total: 119 mg/dL (ref 100–199)
HDL: 35 mg/dL — ABNORMAL LOW (ref >39)
LDL Chol Calc (NIH): 50 mg/dL (ref 0–99)
Triglycerides: 207 mg/dL — ABNORMAL HIGH (ref 0–149)
VLDL Cholesterol Cal: 34 mg/dL (ref 5–40)

## 2021-08-26 LAB — HEMOGLOBIN A1C
Est. average glucose Bld gHb Est-mCnc: 128 mg/dL
Hgb A1c MFr Bld: 6.1 % — ABNORMAL HIGH (ref 4.8–5.6)

## 2021-08-28 ENCOUNTER — Ambulatory Visit: Payer: Self-pay | Admitting: Family Medicine

## 2021-10-05 ENCOUNTER — Telehealth: Payer: Self-pay

## 2021-10-05 NOTE — Progress Notes (Signed)
APPOINTMENT REMINDER  Clarence Hamilton was reminded to have all medications, supplements and any blood glucose and blood pressure readings available for review with Angelena Sole, Pharm. D, at his telephone visit on 10/06/2021 at 3:45 pm.  Patient Confirm Appointment.  Everlean Cherry Clinical Pharmacist Assistant [phone]

## 2021-10-06 ENCOUNTER — Ambulatory Visit (INDEPENDENT_AMBULATORY_CARE_PROVIDER_SITE_OTHER): Payer: Medicare Other

## 2021-10-06 DIAGNOSIS — E782 Mixed hyperlipidemia: Secondary | ICD-10-CM

## 2021-10-06 DIAGNOSIS — I10 Essential (primary) hypertension: Secondary | ICD-10-CM

## 2021-10-06 DIAGNOSIS — J45909 Unspecified asthma, uncomplicated: Secondary | ICD-10-CM

## 2021-10-06 NOTE — Progress Notes (Signed)
Chronic Care Management Pharmacy Note  10/22/2021 Name:  Clarence Hamilton MRN:  [id number] DOB:  November 23, 1955  Summary: Patient presents for CCM follow-up.   Recommendations/Changes made from today's visit:  -Recommend Seasonal Covid-19 Booster   Plan: CPP follow-up 3 months  Subjective: Clarence Hamilton is an 65 y.o. year old male who is a primary patient of Fisher, Kirstie Peri, MD.  The CCM team was consulted for assistance with disease management and care coordination needs.    Engaged with patient by telephone for follow up visit in response to provider referral for pharmacy case management and/or care coordination services.   Consent to Services:  The patient was given information about Chronic Care Management services, agreed to services, and gave verbal consent prior to initiation of services.  Please see initial visit note for detailed documentation.   Patient Care Team: Birdie Sons, MD as PCP - General (Family Medicine) Erby Pian, MD as Referring Physician (Pulmonary Disease) Pa, [place] (Optometry) Suella Broad, MD as Consulting Physician (Physical Medicine and Rehabilitation) Latanya Maudlin, MD as Consulting Physician (Orthopedic Surgery) Levy Pupa, PA-C as Physician Assistant ([place]) Germaine Pomfret, [facility] as Pharmacist (Pharmacist)  Recent office visits: 08/25/21: Patient presented to Dr. Caryn Section for follow-up. A1c 6.1%. LDL improved to 50.  02/17/21: Patient presented to Dr. Caryn Section for follow-up. A1c stable at 5.8%. PSA elevated. LDL 120.   Recent consult visits: 07/15/21: Patient presented to Dr. Raul Del (Pulmonology) for follow-up.  02/10/21: Patient presented to Dr. Raul Del (Pulmonology) for follow-up.   Hospital visits: None in previous 6 months  Objective:  Lab Results  Component Value Date   CREATININE 1.30 (H) 08/25/2021   BUN 24 08/25/2021   GFRNONAA 69 02/20/2020   GFRAA 79 02/20/2020   NA 141  08/25/2021   K 3.7 08/25/2021   CALCIUM 9.2 08/25/2021   CO2 30 (H) 08/25/2021    Lab Results  Component Value Date/Time   HGBA1C 6.1 (H) 08/25/2021 03:40 PM   HGBA1C 5.8 (A) 02/17/2021 03:56 PM   HGBA1C 6.0 (A) 08/19/2020 04:33 PM   HGBA1C 6.6 (H) 10/13/2017 02:47 PM    Last diabetic Eye exam: No results found for: HMDIABEYEEXA  Last diabetic Foot exam: No results found for: HMDIABFOOTEX   Lab Results  Component Value Date   CHOL 119 08/25/2021   HDL 35 (L) 08/25/2021   LDLCALC 50 08/25/2021   TRIG 207 (H) 08/25/2021   CHOLHDL 3.4 08/25/2021    Hepatic Function Latest Ref Rng & Units 08/25/2021 02/17/2021 02/20/2020  Total Protein 6.0 - 8.5 g/dL 6.7 7.1 6.8  Albumin 3.8 - 4.8 g/dL 4.3 4.4 4.2  AST 0 - 40 IU/L 17 20 25   ALT 0 - 44 IU/L 29 29 25   Alk Phosphatase 44 - 121 IU/L 101 121 109  Total Bilirubin 0.0 - 1.2 mg/dL 0.2 0.3 0.3    Lab Results  Component Value Date/Time   TSH 1.200 02/20/2020 10:37 AM   TSH 1.490 03/31/2016 10:20 AM    CBC Latest Ref Rng & Units 02/17/2021 02/20/2020 03/31/2016  WBC 3.4 - 10.8 x10E3/uL 10.4 12.8(H) 9.9  Hemoglobin 13.0 - 17.7 g/dL 13.5 13.1 14.9  Hematocrit 37.5 - 51.0 % 41.7 39.2 44.5  Platelets 150 - 450 x10E3/uL 311 286 278    No results found for: VD25OH  Clinical ASCVD: Yes  The ASCVD Risk score (Arnett DK, et al., 2019) failed to calculate for the following reasons:   The valid total cholesterol  range is 130 to 320 mg/dL    Depression screen [facility] 2/9 08/25/2021 08/19/2020 12/25/2019  Decreased Interest 0 0 0  Down, Depressed, Hopeless 0 0 1  PHQ - 2 Score 0 0 1  Altered sleeping 1 0 -  Tired, decreased energy 1 0 -  Change in appetite 0 1 -  Feeling bad or failure about yourself  0 0 -  Trouble concentrating 1 0 -  Moving slowly or fidgety/restless 0 0 -  Suicidal thoughts 0 0 -  PHQ-9 Score 3 1 -  Difficult doing work/chores Somewhat difficult Not difficult at all -    Social History   Tobacco Use  Smoking Status  Never  Smokeless Tobacco Never   BP Readings from Last 3 Encounters:  08/25/21 114/68  07/13/21 103/63  04/20/21 128/76   Pulse Readings from Last 3 Encounters:  08/25/21 93  07/13/21 89  04/20/21 76   Wt Readings from Last 3 Encounters:  07/13/21 280 lb (127 kg)  04/20/21 270 lb (122.5 kg)  08/19/20 270 lb (122.5 kg)    Assessment/Interventions: Review of patient past medical history, allergies, medications, health status, including review of consultants reports, laboratory and other test data, was performed as part of comprehensive evaluation and provision of chronic care management services.   SDOH:  (Social Determinants of Health) assessments and interventions performed: Yes SDOH Interventions    Flowsheet Row Most Recent Value  SDOH Interventions   Financial Strain Interventions Intervention Not Indicated         CCM Care Plan  Allergies  Allergen Reactions   Sulfa Antibiotics    Tetracycline     Headache   Tramadol     insomnia   Corticosteroids     Other reaction(s): Muscle Pain    Medications Reviewed Today     Reviewed by Randal Buba, CMA (Certified Medical Assistant) on 08/25/21 at 1452  Med List Status: <None>   Medication Order Taking? Sig Documenting Provider Last Dose Status Informant  alprazolam (XANAX) 2 MG tablet [id number] Yes TAKE 1 TABLET BY MOUTH FOUR TIMES DAILY Birdie Sons, MD Taking Active   Azelastine-Fluticasone 137-50 MCG/ACT SUSP [id number] Yes Place 1 spray into both nostrils 2 (two) times daily. [provider] Taking Active            Med Note Josiah Lobo, Deveron Furlong   Mon Feb 16, 2016 12:04 PM) Received from: [place]: Place into the nose.  busPIRone (BUSPAR) 15 MG tablet [id number] Yes TAKE 1/2 TABLET BY MOUTH FOUR TIMES DAILY Birdie Sons, MD Taking Active   diltiazem (CARDIZEM CD) 240 MG 24 hr capsule [id number] No Take 1 capsule by mouth daily. bedtime [provider] Unknown Active            Med Note Josiah Lobo, ROSHENA L   Mon Feb 16, 2016 12:04 PM) Received from: [place]: Take by mouth.  fluticasone (FLONASE) 50 MCG/ACT nasal spray [id number] No Place 2 sprays into both nostrils every morning.  [provider] Unknown Active            Med Note Josiah Lobo, Deveron Furlong   Mon Feb 16, 2016 12:04 PM) Received from: [place]:   Fluticasone-Salmeterol (ADVAIR) 250-50 MCG/DOSE AEPB [id number] Yes Inhale 1 puff into the lungs 2 (two) times daily. [provider] Taking Active Self  furosemide (LASIX) 20 MG tablet [id number] No Take 1 tablet by mouth daily. [provider]  Unknown Active            Med Note (CHAMBERS, ROSHENA L   Mon Feb 16, 2016 12:04 PM) Received from: [place]: Take by mouth.  hydrochlorothiazide (HYDRODIURIL) 25 MG tablet [id number] Yes TAKE 1 TABLET BY MOUTH EVERY DAY Birdie Sons, MD Taking Active   HYDROcodone-acetaminophen [facility]) 10-325 MG tablet [id number] Yes Take 1 tablet by mouth in the morning, at noon, and at bedtime.  [provider] Taking Active   ketoconazole (NIZORAL) 2 % cream [id number] No Apply 1 application topically daily. Birdie Sons, MD Unknown Active   losartan (COZAAR) 100 MG tablet [id number] No Take 100 mg by mouth daily. [provider] Unknown Active   meloxicam (MOBIC) 15 MG tablet [id number] Yes TAKE 1 TABLET(15 MG) BY MOUTH DAILY Fisher, Kirstie Peri, MD Taking Active   montelukast (SINGULAIR) 10 MG tablet [id number] Yes Take 1 tablet by mouth daily. [provider] Taking Active            Med Note Josiah Lobo, Deveron Furlong   Mon Feb 16, 2016 12:04 PM) Received from: [place]:   Multiple Vitamins-Minerals (CENTRUM SILVER PO) [id number] Yes Take 1 tablet by mouth daily. [provider] Taking Active            Med Note  Josiah Lobo, Deveron Furlong   Mon Feb 16, 2016 12:04 PM) Received from: [place]:   mupirocin ointment (BACTROBAN) 2 % [id number] Yes Apply 1 application topically daily. Virginia Crews, MD Taking Active   OXYGEN [id number] Yes Place 3 L into the nose. At all times [provider] Taking Active   potassium chloride SA (KLOR-CON) 20 MEQ tablet [id number] Yes TAKE 1 TABLET BY MOUTH EVERY DAY Birdie Sons, MD Taking Active   PROAIR HFA 108 [phone] Base) MCG/ACT inhaler [id number] Yes Inhale 1 puff into the lungs every 6 (six) hours as needed.  [provider] Taking Active            Med Note Sabra Heck, Emmaline Kluver M   Tue Feb 17, 2016  2:20 PM) Received from: External Pharmacy  rosuvastatin (CRESTOR) 20 MG tablet [id number] Yes Take 1 tablet (20 mg total) by mouth daily. Birdie Sons, MD Taking Active   tamsulosin [facility]) 0.4 MG CAPS capsule [id number] Yes TAKE 1 CAPSULE(0.4 MG) BY MOUTH DAILY Stoioff, Ronda Fairly, MD Taking Active   triamcinolone cream (KENALOG) 0.1 % [id number] Yes Apply 1 application topically 2 (two) times daily. Birdie Sons, MD Taking Active   venlafaxine XR (EFFEXOR-XR) 75 MG 24 hr capsule [id number] Yes TAKE 3 CAPSULES(225 MG) BY MOUTH EVERY MORNING Birdie Sons, MD Taking Active             Patient Active Problem List   Diagnosis Date Noted   Chronic respiratory failure with hypoxia ([place]) 02/17/2021   Cellulitis of left lower extremity 06/09/2020   ILD (interstitial lung disease) ([place]) 12/25/2019   Aortic atherosclerosis ([place]) 10/14/2017   Elevated PSA 04/01/2016   Morbid obesity, unspecified obesity type ([place]) 02/16/2016   Dermatitis, stasis 02/06/2015   Edema leg 02/06/2015   Prediabetes 04/30/2014   Hyperlipidemia, mixed 09/05/2008   Hypertension 02/13/2008   Anxiety disorder 10/25/1998   Asthma, intrinsic 10/25/1968    Immunization History  Administered Date(s) Administered   H1N1 08/29/2008   Influenza  Split 10/03/2014   Influenza,inj,Quad PF,6+ Mos 08/19/2020, 08/25/2021   Influenza-Unspecified 09/07/2017, 07/25/2018, 07/31/2019   Tdap  02/17/2016    Conditions to be addressed/monitored:  Hypertension, Hyperlipidemia, Asthma, Anxiety, Osteoarthritis and Prediabetes  Care Plan : General Pharmacy (Adult)  Updates made by Germaine Pomfret, RPH since 10/22/2021 12:00 AM     Problem: Hypertension, Hyperlipidemia, Asthma, Anxiety, Osteoarthritis and Prediabetes   Priority: High     Long-Range Goal: Patient-Specific Goal   Start Date: 12/17/2020  Expected End Date: 07/24/2022  This Visit's Progress: On track  Recent Progress: On track  Priority: High  Note:   Current Barriers:  Unable to independently monitor therapeutic efficacy  Pharmacist Clinical Goal(s):  Over the next 90 days, patient will maintain control of blood pressure  as evidenced by BP less than 140/90  through collaboration with PharmD and provider.   Interventions: 1:1 collaboration with Birdie Sons, MD regarding development and update of comprehensive plan of care as evidenced by provider attestation and co-signature Inter-disciplinary care team collaboration (see longitudinal plan of care) Comprehensive medication review performed; medication list updated in electronic medical record  Hypertension (BP goal <140/90) -controlled -Current treatment: Diltiazem CD 240 mg daily  Furosemide 20 mg daily  HCTZ 25 mg daily  Losartan 100 mg daily  -Medications previously tried: NA  -Current home readings: NA,  -Current dietary habits: Does not follow any specific dietary regimen. Does try to limit salt. -Current exercise habits: Does not follow any specific exercise regimen. Patient is limited by osteoarthritis pain.  -Denies hypotensive/hypertensive symptoms -Educated on Daily salt intake goal < 2300 mg; Importance of home blood pressure monitoring; -Counseled to monitor BP at home weekly, document, and  provide log at future appointments -Recommended to continue current medication  Hyperlipidemia: (LDL goal < 70) -Controlled -Current treatment: Rosuvastatin 20 mg nightly  -Medications previously tried: NA  -Educated on Benefits of statin for ASCVD risk reduction; -Tolerating rosuvastatin well, saw marked improvement in cholesterol reduction.  Continue current medications  Asthma/ Interstitial Lung Disease/ Chronic Respiratory Failure (Goal: control symptoms and prevent exacerbations) -Managed by Dr. Raul Del  -controlled -Current treatment  Dymista 137-60 mcg/act 1 spray twice daily  Flonase nasal spray  Advair 250-50 mcg/act 1 puff twice daily  Monteluakst 10 mg nightly  ProAir HFA  Oxygen 3L -Medications previously tried: NA  -Received Covid vaccines (twice) , unsure of dates  -Pulmonary function testing: NA -Exacerbations requiring treatment in last 6 months: None -Patient reports consistent use of maintenance inhaler -Frequency of rescue inhaler use: infrequently -Counseled on When to use rescue inhaler -Recommend Seasonal Covid-19 Booster  -Recommended to continue current medication  Chronic Pain (Goal: Provide adequate pain relief) -Controlled  -Managed by Levy Pupa, PA-C and Dr. Nelva Bush -Receives knee injections every 6 months  -Current treatment   Hydrocodone-APAP 10-325 mg three times daily  Meloxicam 15 mg daily  -Medications previously tried: NA   -Recommended to continue current medication  Depression/Anxiety (Goal: Maintain stable mood) -Controlled -Current treatment: Alprazolam 2 mg four times daily  Buspirone 15 mg 1/2 tablet three times daily  Venlafaxine XR 75 mg 3 capsules daily  -Medications previously tried/failed: NA -PHQ9: 1 -GAD7: NA -Educated on Benefits of medication for symptom control -Recommended to continue current medication  Patient Goals/Self-Care Activities Over the next 90 days, patient will:  - check blood pressure weekly,  document, and provide at future appointments  Follow Up Plan: Telephone follow up appointment with care management team member scheduled for:  01/04/2022 at 3:45 PM        Medication Assistance: None required.  Patient affirms current coverage meets  needs.  Patient's preferred pharmacy is:  [facility] [place], [place] [facility] [place] [address] [place] [address] Phone: [phone] Fax: [phone]   Uses pill box? Yes Pt endorses 100% compliance  We discussed: Current pharmacy is preferred with insurance plan and patient is satisfied with pharmacy services Patient decided to: Continue current medication management strategy  Care Plan and Follow Up Patient Decision:  Patient agrees to Care Plan and Follow-up.  Plan: Telephone follow up appointment with care management team member scheduled for:  01/04/2022 at 3:45 PM  Junius Argyle, PharmD, [place], [place] [phone]

## 2021-10-21 ENCOUNTER — Other Ambulatory Visit: Payer: Self-pay | Admitting: Family Medicine

## 2021-10-22 NOTE — Patient Instructions (Signed)
Visit Information It was great speaking with you today!  Please let me know if you have any questions about our visit.   Goals Addressed             This Visit's Progress    Track and Manage My Blood Pressure-Hypertension   On track    Timeframe:  Long-Range Goal Priority:  High Start Date: 07/24/21                             Expected End Date: 07/24/22                       Follow Up within 90 days   - check blood pressure weekly - write blood pressure results in a log or diary    Why is this important?   You won't feel high blood pressure, but it can still hurt your blood vessels.  High blood pressure can cause heart or kidney problems. It can also cause a stroke.  Making lifestyle changes like losing a little weight or eating less salt will help.  Checking your blood pressure at home and at different times of the day can help to control blood pressure.  If the doctor prescribes medicine remember to take it the way the doctor ordered.  Call the office if you cannot afford the medicine or if there are questions about it.     Notes:      Track and Manage My Symptoms-Asthma   On track    Timeframe:  Long-Range Goal Priority:  High Start Date:   12/16/2020                          Expected End Date:  06/01/2022                     Follow Up within 90 days   - avoid symptom triggers outdoors - follow asthma action plan - keep rescue medicines on hand    Why is this important?   Keeping track of asthma symptoms can tell you a lot about your asthma control.  Based on symptoms and peak flow results you can see how well you are doing.  Your asthma action plan has a green, yellow and red zone. Green means all is good; it is your goal. Yellow means your symptoms are a little worse. You will need to adjust your medications. Being in the red zone means that your   asthma is out of control. You will need to use your rescue medicines. You may need emergency care.     Notes:          Patient Care Plan: General Pharmacy (Adult)     Problem Identified: Hypertension, Hyperlipidemia, Asthma, Anxiety, Osteoarthritis and Prediabetes   Priority: High     Long-Range Goal: Patient-Specific Goal   Start Date: 12/17/2020  Expected End Date: 07/24/2022  This Visit's Progress: On track  Recent Progress: On track  Priority: High  Note:   Current Barriers:  Unable to independently monitor therapeutic efficacy  Pharmacist Clinical Goal(s):  Over the next 90 days, patient will maintain control of blood pressure  as evidenced by BP less than 140/90  through collaboration with PharmD and provider.   Interventions: 1:1 collaboration with Malva Limes, MD regarding development and update of comprehensive plan of care as evidenced by provider attestation and co-signature Inter-disciplinary  care team collaboration (see longitudinal plan of care) Comprehensive medication review performed; medication list updated in electronic medical record  Hypertension (BP goal <140/90) -controlled -Current treatment: Diltiazem CD 240 mg daily  Furosemide 20 mg daily  HCTZ 25 mg daily  Losartan 100 mg daily  -Medications previously tried: NA  -Current home readings: NA,  -Current dietary habits: Does not follow any specific dietary regimen. Does try to limit salt. -Current exercise habits: Does not follow any specific exercise regimen. Patient is limited by osteoarthritis pain.  -Denies hypotensive/hypertensive symptoms -Educated on Daily salt intake goal < 2300 mg; Importance of home blood pressure monitoring; -Counseled to monitor BP at home weekly, document, and provide log at future appointments -Recommended to continue current medication  Hyperlipidemia: (LDL goal < 70) -Controlled -Current treatment: Rosuvastatin 20 mg nightly  -Medications previously tried: NA  -Educated on Benefits of statin for ASCVD risk reduction; -Tolerating rosuvastatin well, saw marked improvement  in cholesterol reduction.  Continue current medications  Asthma/ Interstitial Lung Disease/ Chronic Respiratory Failure (Goal: control symptoms and prevent exacerbations) -Managed by Dr. Meredeth Ide  -controlled -Current treatment  Dymista 137-60 mcg/act 1 spray twice daily  Flonase nasal spray  Advair 250-50 mcg/act 1 puff twice daily  Monteluakst 10 mg nightly  ProAir HFA  Oxygen 3L -Medications previously tried: NA  -Received Covid vaccines (twice) , unsure of dates  -Pulmonary function testing: NA -Exacerbations requiring treatment in last 6 months: None -Patient reports consistent use of maintenance inhaler -Frequency of rescue inhaler use: infrequently -Counseled on When to use rescue inhaler -Recommend Seasonal Covid-19 Booster  -Recommended to continue current medication  Chronic Pain (Goal: Provide adequate pain relief) -Controlled  -Managed by Su Hoff, PA-C and Dr. Ethelene Hal -Receives knee injections every 6 months  -Current treatment   Hydrocodone-APAP 10-325 mg three times daily  Meloxicam 15 mg daily  -Medications previously tried: NA   -Recommended to continue current medication  Depression/Anxiety (Goal: Maintain stable mood) -Controlled -Current treatment: Alprazolam 2 mg four times daily  Buspirone 15 mg 1/2 tablet three times daily  Venlafaxine XR 75 mg 3 capsules daily  -Medications previously tried/failed: NA -PHQ9: 1 -GAD7: NA -Educated on Benefits of medication for symptom control -Recommended to continue current medication  Patient Goals/Self-Care Activities Over the next 90 days, patient will:  - check blood pressure weekly, document, and provide at future appointments  Follow Up Plan: Telephone follow up appointment with care management team member scheduled for:  01/04/2022 at 3:45 PM    Patient agreed to services and verbal consent obtained.   The patient verbalized understanding of instructions, educational materials, and care plan  provided today and declined offer to receive copy of patient instructions, educational materials, and care plan.   Angelena Sole, PharmD, Patsy [place], CPP  Clinical Pharmacist Practitioner  [facility] [phone]

## 2021-10-24 DIAGNOSIS — I10 Essential (primary) hypertension: Secondary | ICD-10-CM

## 2021-10-24 DIAGNOSIS — E782 Mixed hyperlipidemia: Secondary | ICD-10-CM

## 2021-10-24 DIAGNOSIS — J45909 Unspecified asthma, uncomplicated: Secondary | ICD-10-CM | POA: Diagnosis not present

## 2021-10-26 ENCOUNTER — Other Ambulatory Visit: Payer: Self-pay | Admitting: Family Medicine

## 2021-10-26 DIAGNOSIS — E782 Mixed hyperlipidemia: Secondary | ICD-10-CM

## 2021-10-27 ENCOUNTER — Other Ambulatory Visit: Payer: Self-pay | Admitting: Family Medicine

## 2021-10-27 DIAGNOSIS — F419 Anxiety disorder, unspecified: Secondary | ICD-10-CM

## 2021-10-27 MED ORDER — BUSPIRONE HCL 15 MG PO TABS: 15.0000 mg | ORAL_TABLET | Freq: Two times a day (BID) | ORAL | 3 refills | Status: DC

## 2021-10-27 NOTE — Telephone Encounter (Signed)
Last refill: 03/24/2021 qty 180 with 1 refill  Last office visit: 08/25/2021 Next office visit: 02/26/2022

## 2021-10-27 NOTE — Telephone Encounter (Signed)
[facility] faxed refill request for the following medications:  busPIRone (BUSPAR) 15 MG tablet   Please advise.

## 2021-10-31 DIAGNOSIS — M5416 Radiculopathy, lumbar region: Secondary | ICD-10-CM | POA: Insufficient documentation

## 2021-11-25 ENCOUNTER — Telehealth: Payer: Self-pay

## 2021-11-25 NOTE — Progress Notes (Signed)
Chronic Care Management Pharmacy Assistant   Name: Clarence Hamilton  MRN: [id number] DOB: 03/31/1956  Reason for Encounter: Hypertension Disease State Call.    Recent office visits:  No recent office visit  Recent consult visits:  10/08/2021 Dr. Meredeth Ide MD (Pulmonology) No Medication Changes noted  Hospital visits:  None in previous 6 months  Medications: Outpatient Encounter Medications as of 11/25/2021  Medication Sig Note   alprazolam (XANAX) 2 MG tablet Take 1 tablet (2 mg total) by mouth 4 (four) times daily.    Azelastine-Fluticasone 137-50 MCG/ACT SUSP Place 1 spray into both nostrils 2 (two) times daily. 02/16/2016: Received from: [facility] Received Sig: Place into the nose.   busPIRone (BUSPAR) 15 MG tablet Take 1 tablet (15 mg total) by mouth 2 (two) times daily.    diltiazem (CARDIZEM CD) 240 MG 24 hr capsule Take 1 capsule by mouth daily. bedtime 02/16/2016: Received from: [facility] Received Sig: Take by mouth.   Fluticasone-Salmeterol (ADVAIR) 250-50 MCG/DOSE AEPB Inhale 1 puff into the lungs 2 (two) times daily.    furosemide (LASIX) 20 MG tablet Take 1 tablet (20 mg total) by mouth daily as needed for fluid.    hydrochlorothiazide (HYDRODIURIL) 25 MG tablet TAKE 1 TABLET BY MOUTH EVERY DAY    HYDROcodone-acetaminophen (NORCO) 10-325 MG tablet Take 1 tablet by mouth in the morning, at noon, and at bedtime.     ketoconazole (NIZORAL) 2 % cream Apply 1 application topically daily.    losartan (COZAAR) 100 MG tablet Take 100 mg by mouth daily.    meloxicam (MOBIC) 15 MG tablet TAKE 1 TABLET(15 MG) BY MOUTH DAILY    montelukast (SINGULAIR) 10 MG tablet Take 1 tablet by mouth daily. 02/16/2016: Received from: [facility] Received Sig:    Multiple Vitamins-Minerals (CENTRUM SILVER PO) Take 1 tablet by mouth daily. 02/16/2016: Received from: [facility] Received Sig:    mupirocin ointment (BACTROBAN) 2  % Apply 1 application topically daily.    OXYGEN Place 3 L into the nose. At all times    potassium chloride SA (KLOR-CON) 20 MEQ tablet TAKE 1 TABLET BY MOUTH EVERY DAY    PROAIR HFA 108 (90 Base) MCG/ACT inhaler Inhale 1 puff into the lungs every 6 (six) hours as needed.  02/17/2016: Received from: External Pharmacy   rosuvastatin (CRESTOR) 20 MG tablet TAKE 1 TABLET(20 MG) BY MOUTH DAILY    tamsulosin (FLOMAX) 0.4 MG CAPS capsule TAKE 1 CAPSULE(0.4 MG) BY MOUTH DAILY    triamcinolone cream (KENALOG) 0.1 % Apply 1 application topically 2 (two) times daily.    venlafaxine XR (EFFEXOR-XR) 75 MG 24 hr capsule TAKE 3 CAPSULES(225 MG) BY MOUTH EVERY MORNING    No facility-administered encounter medications on file as of 11/25/2021.    Care Gaps: COVID-19 Vaccine HIV Screening Shingrix Vaccine  Star Rating Drugs: Simvastatin 20 mg last filled 08/17/2021 90 day supply at [facility] Losartan 100 mg last filled 09/14/2021 90 day supply at [facility]. Medication Fill Gaps: None ID  Reviewed chart prior to disease state call. Spoke with patient regarding BP  Recent Office Vitals: BP Readings from Last 3 Encounters:  08/25/21 114/68  07/13/21 103/63  04/20/21 128/76   Pulse Readings from Last 3 Encounters:  08/25/21 93  07/13/21 89  04/20/21 76    Wt Readings from Last 3 Encounters:  07/13/21 280 lb (127 kg)  04/20/21 270 lb (122.5 kg)  08/19/20 270 lb (122.5 kg)  Kidney Function Lab Results  Component Value Date/Time   CREATININE 1.30 (H) 08/25/2021 03:40 PM   CREATININE 1.39 (H) 02/17/2021 04:18 PM   CREATININE 1.05 10/13/2017 02:47 PM   GFRNONAA 69 02/20/2020 10:37 AM   GFRNONAA 77 10/13/2017 02:47 PM   GFRAA 79 02/20/2020 10:37 AM   GFRAA 89 10/13/2017 02:47 PM    BMP Latest Ref Rng & Units 08/25/2021 02/17/2021 02/20/2020  Glucose 70 - 99 mg/dL 676(P) 950(D) 326(Z)  BUN 8 - 27 mg/dL 24 14 13   Creatinine 0.76 - 1.27 mg/dL ) 1.24(P) 8.09(X   BUN/Creat Ratio 10 - 24 18 10 11   Sodium 134 - 144 mmol/L 141 143 142  Potassium 3.5 - 5.2 mmol/L 3.7 4.6 4.1  Chloride 96 - 106 mmol/L 98 96 98  CO2 20 - 29 mmol/L 30(H) 30(H) 27  Calcium 8.6 - 10.2 mg/dL 9.2 9.7 9.3    Current antihypertensive regimen:  Diltiazem CD 240 mg daily  Furosemide 20 mg daily  HCTZ 25 mg daily  Losartan 100 mg daily   How often are you checking your Blood Pressure? weekly Current home BP readings:  Patient states his blood pressure has been ranging around 118-120/70's. Patient denies any headaches ,dizziness or lightheadedness.Patient states ever since he started taking Hydrocodone, he no longer experience headaches.   What recent interventions/DTPs have been made by any provider to improve Blood Pressure control since last CPP Visit: None ID  Any recent hospitalizations or ED visits since last visit with CPP? No  What diet changes have been made to improve Blood Pressure Control?  Patient states he is try's to limit his salt intake.  What exercise is being done to improve your Blood Pressure Control?  Patient reports walking as much as he can but he is limited due to his osteoarthritis pain.  Patient reports he is recovering from COVID.Patient states he had tested hisself at home , and was positive about two weeks ago.Patient reports he is feeling better, just a little congested.Patient reports taking DayQuil to help with his congestion, and using his oxygen machine. Patient states he is going to test his self one more time just to be sure he no longer has COVID before going anywhere.  Adherence Review: Is the patient currently on ACE/ARB medication? Yes Does the patient have >5 day gap between last estimated fill dates? No  Telephone follow up appointment with care management team member scheduled for:  01/04/2022 at 3:45 PM  [facility] Clinical Pharmacist Assistant [phone]

## 2021-12-13 ENCOUNTER — Other Ambulatory Visit: Payer: Self-pay | Admitting: Urology

## 2021-12-31 ENCOUNTER — Telehealth: Payer: Self-pay

## 2021-12-31 NOTE — Progress Notes (Signed)
Left a voice message to inform patient per Clinical pharmacist, please reschedule patient appointment on 01/04/2022 to July. ? ?Telephone follow up appointment with Care management team member scheduled for : 05/03/2022 at 3:45 pm.Left a voice message informing patient to return if he needs to reschedule. ? ?Anderson Malta ?Clinical Pharmacist Assistant ?[phone]  ? ? ?

## 2022-01-03 DIAGNOSIS — J449 Chronic obstructive pulmonary disease, unspecified: Secondary | ICD-10-CM | POA: Diagnosis not present

## 2022-01-03 DIAGNOSIS — J45909 Unspecified asthma, uncomplicated: Secondary | ICD-10-CM | POA: Diagnosis not present

## 2022-01-04 ENCOUNTER — Telehealth: Payer: Medicare Other

## 2022-01-08 ENCOUNTER — Other Ambulatory Visit: Payer: Self-pay

## 2022-01-08 DIAGNOSIS — R972 Elevated prostate specific antigen [PSA]: Secondary | ICD-10-CM

## 2022-01-08 DIAGNOSIS — N401 Enlarged prostate with lower urinary tract symptoms: Secondary | ICD-10-CM

## 2022-01-12 ENCOUNTER — Other Ambulatory Visit: Payer: Medicare Other

## 2022-01-12 ENCOUNTER — Encounter: Payer: Self-pay | Admitting: Urology

## 2022-01-14 ENCOUNTER — Encounter: Payer: Self-pay | Admitting: Urology

## 2022-01-14 ENCOUNTER — Ambulatory Visit: Payer: Medicare HMO | Admitting: Urology

## 2022-01-26 ENCOUNTER — Telehealth: Payer: Self-pay

## 2022-01-26 NOTE — Progress Notes (Signed)
? ? ?Chronic Care Management ?Pharmacy Assistant  ? ?Name: Clarence Hamilton  MRN: [id number] DOB: Aug 26, 1956 ? ?Reason for [place] Call. ?  ?Recent office visits:  ?No recent office visit ? ?Recent consult visits:  ?No recent consult visit ? ?Hospital visits:  ?None in previous 6 months ? ?Medications: ?Outpatient Encounter Medications as of 01/26/2022  ?Medication Sig Note  ? alprazolam (XANAX) 2 MG tablet Take 1 tablet (2 mg total) by mouth 4 (four) times daily.   ? Azelastine-Fluticasone 137-50 MCG/ACT SUSP Place 1 spray into both nostrils 2 (two) times daily. 02/16/2016: Received from: [place]: Place into the nose.  ? busPIRone (BUSPAR) 15 MG tablet Take 1 tablet (15 mg total) by mouth 2 (two) times daily.   ? diltiazem (CARDIZEM CD) 240 MG 24 hr capsule Take 1 capsule by mouth daily. bedtime 02/16/2016: Received from: [place]: Take by mouth.  ? Fluticasone-Salmeterol (ADVAIR) 250-50 MCG/DOSE AEPB Inhale 1 puff into the lungs 2 (two) times daily.   ? furosemide (LASIX) 20 MG tablet Take 1 tablet (20 mg total) by mouth daily as needed for fluid.   ? hydrochlorothiazide (HYDRODIURIL) 25 MG tablet TAKE 1 TABLET BY MOUTH EVERY DAY   ? HYDROcodone-acetaminophen (NORCO) 10-325 MG tablet Take 1 tablet by mouth in the morning, at noon, and at bedtime.    ? ketoconazole (NIZORAL) 2 % cream Apply 1 application topically daily.   ? losartan (COZAAR) 100 MG tablet Take 100 mg by mouth daily.   ? meloxicam (MOBIC) 15 MG tablet TAKE 1 TABLET(15 MG) BY MOUTH DAILY   ? montelukast (SINGULAIR) 10 MG tablet Take 1 tablet by mouth daily. 02/16/2016: Received from: [place]:   ? Multiple Vitamins-Minerals (CENTRUM SILVER PO) Take 1 tablet by mouth daily. 02/16/2016: Received from: [place]:   ? mupirocin ointment (BACTROBAN) 2 % Apply 1 application topically daily.   ? OXYGEN  Place 3 L into the nose. At all times   ? potassium chloride SA (KLOR-CON) 20 MEQ tablet TAKE 1 TABLET BY MOUTH EVERY DAY   ? PROAIR HFA 108 (90 Base) MCG/ACT inhaler Inhale 1 puff into the lungs every 6 (six) hours as needed.  02/17/2016: Received from: External Pharmacy  ? rosuvastatin (CRESTOR) 20 MG tablet TAKE 1 TABLET(20 MG) BY MOUTH DAILY   ? tamsulosin (FLOMAX) 0.4 MG CAPS capsule TAKE 1 CAPSULE(0.4 MG) BY MOUTH DAILY   ? triamcinolone cream (KENALOG) 0.1 % Apply 1 application topically 2 (two) times daily.   ? venlafaxine XR (EFFEXOR-XR) 75 MG 24 hr capsule TAKE 3 CAPSULES(225 MG) BY MOUTH EVERY MORNING   ? ?No facility-administered encounter medications on file as of 01/26/2022.  ? ? ?Care Gaps: ?COVID-19 Vaccine ?HIV Screening ?Shingrix Vaccine ?  ?Star Rating Drugs: ?Rosuvastatin 20 mg last filled 10/07/2021 90 day supply at [facility] ?Losartan 100 mg last filled 01/05/2022 [facility] day supply at [facility]. ? ?Medication Fill Gaps: ?None ID ? ?Current Asthma regimen: Dymista 137-60 mcg/act 1 spray twice daily  ?Flonase nasal spray  ?Advair 250-50 mcg/act 1 puff twice daily  ?Monteluakst 10 mg nightly  ?ProAir HFA  ?Oxygen 3L ? ?Any recent hospitalizations or ED visits since last visit with CPP? No ? ?Reports Asthma symptoms, including Increased shortness of breath , Symptoms worse at night, and Wheezing ? Patient states he is recovering from [place], and his symptoms has improved but still not back at his normal  self.Patient states he purchase some over the counter medication which has help his symptoms.Patient reports he is staying home as much as he can because it is hard for him to carry his oxygen machine around. ? ?What recent interventions/DTPs have been made by any provider to improve breathing since last visit:None ID ? ?Have you had exacerbation/flare-up since last visit? Yes ? Patient reports having mild flare-up due to COVID. ? ?What do you do when you are short of breath?  Rescue  medication and Rest ? ?Respiratory Devices/Equipment ?Do you have a nebulizer? Yes ? ?Do you use a Peak Flow Meter? No ? ?Do you use a maintenance inhaler? Yes ? ?How often do you forget to use your daily inhaler?  ?Patient reports he use his daily inhaler as prescribe. ? ?Do you use a rescue inhaler? Yes ? ?How often do you use your rescue inhaler?   PRN, patient states he use his rescue only as needed, and has abundance supply on hand. ? ?Do you use a spacer with your inhaler? No ? ?Adherence Review: ?Does the patient have >5 day gap between last estimated fill date for maintenance inhaler medications? No ? ?Telephone follow up appointment with Care management team member scheduled for : 05/03/2022 at 3:45 pm. ? ?Anderson Malta ?Clinical Pharmacist Assistant ?[phone]  ? ?

## 2022-02-03 DIAGNOSIS — J45909 Unspecified asthma, uncomplicated: Secondary | ICD-10-CM | POA: Diagnosis not present

## 2022-02-03 DIAGNOSIS — J449 Chronic obstructive pulmonary disease, unspecified: Secondary | ICD-10-CM | POA: Diagnosis not present

## 2022-02-16 ENCOUNTER — Telehealth: Payer: Self-pay | Admitting: Family Medicine

## 2022-02-16 NOTE — Telephone Encounter (Signed)
Copied from CRM [id number]. Topic: Medicare AWV ?>> Feb 16, 2022 11:06 AM Claudette Laws R wrote: ?Reason for CRM:  ?Left message for patient to call back and schedule Medicare Annual Wellness Visit (AWV) in office.  ? ?If unable to come into the office for AWV,  please offer to do virtually or by telephone. ? ?Last AWV:  03/25/2020 ? ?Please schedule at anytime with [facility] Health Advisor. ? ?30 minute appointment for Virtual or phone ?45 minute appointment for in office or Initial virtual/phone ? ?Any questions, please contact me at [phone] ?

## 2022-02-22 ENCOUNTER — Other Ambulatory Visit: Payer: Self-pay | Admitting: Family Medicine

## 2022-02-25 NOTE — Progress Notes (Signed)
? ? ? ?Complete Physical Exam  ? ?I,Clarence Hamilton,acting as a scribe for Clarence Merry, MD.,have documented all relevant documentation on the behalf of Clarence Merry, MD,as directed by  Clarence Merry, MD while in the presence of Clarence Merry, MD.  ? ?  ? ?Patient: Clarence Hamilton, Male    DOB: August 31, 1956, 66 y.o.   MRN: [id number] ?Visit Date: 02/26/2022 ? ?Today's Provider: Mila Merry, MD  ? ?Chief Complaint  ?Patient presents with  ? Medicare Wellness  ? Anxiety  ? Hyperlipidemia  ? Prediabetes  ? Hypertension  ? ?Subjective  ?  ?Clarence Hamilton is a 66 y.o. male who presents today for his Annual Wellness Visit. ?He reports consuming a  2 small meals daily  diet. The patient does not participate in regular exercise at present. He generally feels fairly well. He reports sleeping fairly well. He does have additional problems to discuss today.  ? ?HPI ?Anxiety, Follow-up ? ?He was last seen for anxiety 6 months ago. ?Changes made at last visit include none; continue same medication. ?  ?He reports good compliance with treatment. ?He reports good tolerance of treatment. ?He is not having side effects.  ? ?He feels his anxiety is moderate and Worse since last visit. ? ?Symptoms: ?No chest pain No difficulty concentrating  ?No dizziness No fatigue  ?No feelings of losing control No insomnia  ?No irritable No palpitations  ?No panic attacks No racing thoughts  ?No shortness of breath No sweating  ?No tremors/shakes   ? ?GAD-7 Results ?   ? View : No data to display.  ?  ?  ?  ? ? ?PHQ-9 Scores ? ?  02/26/2022  ?  2:42 PM 08/25/2021  ?  2:53 PM 08/19/2020  ?  4:17 PM  ?PHQ9 SCORE ONLY  ?PHQ-9 Total Score 9 3 1   ? ? ?---------------------------------------------------------------------------------------------------  ? ?Lipid/Cholesterol, Follow-up ? ?Last lipid panel Other pertinent labs  ?Lab Results  ?Component Value Date  ? CHOL 119 08/25/2021  ? HDL 35 (L) 08/25/2021  ? LDLCALC 50 08/25/2021  ? TRIG 207 (H)  08/25/2021  ? CHOLHDL 3.4 08/25/2021  ? Lab Results  ?Component Value Date  ? ALT 29 08/25/2021  ? AST 17 08/25/2021  ? PLT 311 02/17/2021  ? TSH 1.200 02/20/2020  ?  ? ?He was last seen for this 6 months ago.  ?Management since that visit includes continuing same medication. ? ?He reports good compliance with treatment. ?He is not having side effects.  ? ?Symptoms: ?No chest pain No chest pressure/discomfort  ?No dyspnea No lower extremity edema  ?No numbness or tingling of extremity No orthopnea  ?No palpitations No paroxysmal nocturnal dyspnea  ?No speech difficulty No syncope  ? ?Current diet:  2 small meals daily ?Current exercise: none ? ?The ASCVD Risk score (Arnett DK, et al., 2019) failed to calculate for the following reasons: ?  The valid total cholesterol range is 130 to 320 mg/dL ? ?---------------------------------------------------------------------------------------------------  ? ?Hypertension, follow-up ? ?BP Readings from Last 3 Encounters:  ?02/26/22 105/60  ?08/25/21 114/68  ?07/13/21 103/63  ? Wt Readings from Last 3 Encounters:  ?02/26/22 230 lb (104.3 kg)  ?07/13/21 280 lb (127 kg)  ?04/20/21 270 lb (122.5 kg)  ?  ? ?He was last seen for hypertension 6 months ago.  ?BP at that visit was 114/68. Management since that visit includes continuing same medication. ? ?He reports good compliance with treatment. ?He is not having side effects.  ?He  is following a Regular diet. ?He is not exercising. ?He does not smoke. ? ?Use of agents associated with hypertension: none.  ? ?Outside blood pressures are checked occasionally. ?Symptoms: ?No chest pain No chest pressure  ?No palpitations No syncope  ?No dyspnea No orthopnea  ?No paroxysmal nocturnal dyspnea No lower extremity edema  ? ?---------------------------------------------------------------------------------------------------  ? ?Prediabetes, Follow-up ? ?Lab Results  ?Component Value Date  ? HGBA1C 6.1 (H) 08/25/2021  ? HGBA1C 5.8 (A) 02/17/2021   ? HGBA1C 6.0 (A) 08/19/2020  ? GLUCOSE 125 (H) 08/25/2021  ? GLUCOSE 101 (H) 02/17/2021  ? GLUCOSE 117 (H) 02/20/2020  ? ? ?Last seen for for this 6 months ago.  ?Management since that visit includes continuing lifestyle modiofications. ?Current symptoms include none and have been stable. ? ?Prior visit with dietician: no ?Current diet: in general, a "healthy" diet   ?Current exercise: none ?-----------------------------------------------------------------------------------------  ? ? ?Medications: ?Outpatient Medications Prior to Visit  ?Medication Sig  ? albuterol (PROVENTIL) (2.5 MG/3ML) 0.083% nebulizer solution SMARTSIG:3 Milliliter(s) Via Nebulizer Every 6 Hours PRN  ? albuterol (VENTOLIN HFA) 108 (90 Base) MCG/ACT inhaler Inhale into the lungs.  ? alprazolam (XANAX) 2 MG tablet Take 1 tablet (2 mg total) by mouth 4 (four) times daily.  ? Azelastine-Fluticasone 137-50 MCG/ACT SUSP Place 1 spray into both nostrils 2 (two) times daily.  ? busPIRone (BUSPAR) 15 MG tablet Take 1 tablet (15 mg total) by mouth 2 (two) times daily. (Patient taking differently: Take 7.5 mg by mouth in the morning, at noon, in the evening, and at bedtime.)  ? fexofenadine (ALLEGRA) 180 MG tablet Take by mouth.  ? fluticasone-salmeterol (ADVAIR) 250-50 MCG/ACT AEPB Inhale into the lungs.  ? furosemide (LASIX) 20 MG tablet TAKE 1 TABLET(20 MG) BY MOUTH DAILY AS NEEDED FOR FLUID RETENTION  ? hydrochlorothiazide (HYDRODIURIL) 25 MG tablet TAKE 1 TABLET BY MOUTH EVERY DAY  ? HYDROcodone-acetaminophen (NORCO) 10-325 MG tablet Take 1 tablet by mouth in the morning, at noon, and at bedtime.   ? ketoconazole (NIZORAL) 2 % cream Apply 1 application topically daily.  ? levalbuterol (XOPENEX) 1.25 MG/3ML nebulizer solution levalbuterol 1.25 mg/3 mL solution for nebulization ? TAKE 3 MLS BY NEBULIZATION EVERY 6 HOURS AS NEEDED FOR WHEEZING  ? losartan (COZAAR) 100 MG tablet Take 1 tablet by mouth daily.  ? meloxicam (MOBIC) 15 MG tablet TAKE 1  TABLET(15 MG) BY MOUTH DAILY  ? montelukast (SINGULAIR) 10 MG tablet Take 1 tablet by mouth daily.  ? Multiple Vitamins-Minerals (CENTRUM SILVER PO) Take 1 tablet by mouth daily.  ? mupirocin ointment (BACTROBAN) 2 % Apply 1 application topically daily.  ? naloxone (NARCAN) nasal spray 4 mg/0.1 mL Narcan 4 mg/actuation nasal spray ? Take by nasal route every 3 minutes until patient awakes or EMS arrives.  ? OXYGEN Place 3 L into the nose. At all times  ? PROAIR HFA 108 (90 Base) MCG/ACT inhaler Inhale 1 puff into the lungs every 6 (six) hours as needed.   ? rosuvastatin (CRESTOR) 20 MG tablet TAKE 1 TABLET(20 MG) BY MOUTH DAILY  ? tamsulosin (FLOMAX) 0.4 MG CAPS capsule TAKE 1 CAPSULE(0.4 MG) BY MOUTH DAILY  ? triamcinolone cream (KENALOG) 0.1 % Apply 1 application topically 2 (two) times daily.  ? valACYclovir (VALTREX) 1000 MG tablet valacyclovir 1 gram tablet ? TK 1 T PO TID  ? venlafaxine XR (EFFEXOR-XR) 75 MG 24 hr capsule TAKE 3 CAPSULES(225 MG) BY MOUTH EVERY MORNING  ? ? diltiazem (TIAZAC) 240 MG 24 hr  capsule Take 1 tablet by mouth daily.  ? ?No facility-administered medications prior to visit.  ?  ?Allergies  ?Allergen Reactions  ? Pollen Extract Cough  ? Sulfa Antibiotics   ? Tetracycline   ?  Headache  ? Tramadol   ?  insomnia  ? Corticosteroids   ?  Other reaction(s): Muscle Pain  ? ? ?Patient Care Team: ?Malva Limes, MD as PCP - General (Family Medicine) ?Mertie Moores, MD as Referring Physician (Pulmonary Disease) ?Pa, [facility]) ?Sheran Luz, MD as Consulting Physician (Physical Medicine and Rehabilitation) ?Ranee Gosselin, MD as Consulting Physician (Orthopedic Surgery) ?Su Hoff, PA-C as Physician Assistant (Chiropractic Medicine) ?Gaspar Cola, [facility] as Pharmacist (Pharmacist) ? ?Review of Systems  ?Constitutional:  Negative for appetite change, chills, fatigue and fever.  ?HENT:  Negative for congestion, ear pain, hearing loss, nosebleeds and trouble  swallowing.   ?Eyes:  Negative for pain and visual disturbance.  ?Respiratory:  Positive for cough. Negative for chest tightness, shortness of breath and wheezing.   ?Cardiovascular:  Negative for chest pai

## 2022-02-26 ENCOUNTER — Ambulatory Visit (INDEPENDENT_AMBULATORY_CARE_PROVIDER_SITE_OTHER): Payer: Medicare Other | Admitting: Family Medicine

## 2022-02-26 ENCOUNTER — Encounter: Payer: Self-pay | Admitting: Family Medicine

## 2022-02-26 VITALS — BP 105/60 | HR 97 | Temp 99.1°F | Ht 69.0 in | Wt 230.0 lb

## 2022-02-26 DIAGNOSIS — R7303 Prediabetes: Secondary | ICD-10-CM

## 2022-02-26 DIAGNOSIS — Z0000 Encounter for general adult medical examination without abnormal findings: Secondary | ICD-10-CM | POA: Diagnosis not present

## 2022-02-26 DIAGNOSIS — I10 Essential (primary) hypertension: Secondary | ICD-10-CM

## 2022-02-26 DIAGNOSIS — E782 Mixed hyperlipidemia: Secondary | ICD-10-CM

## 2022-02-26 DIAGNOSIS — I700 Atherosclerosis of aorta: Secondary | ICD-10-CM

## 2022-02-26 DIAGNOSIS — J9611 Chronic respiratory failure with hypoxia: Secondary | ICD-10-CM

## 2022-02-26 DIAGNOSIS — J849 Interstitial pulmonary disease, unspecified: Secondary | ICD-10-CM

## 2022-02-26 DIAGNOSIS — J441 Chronic obstructive pulmonary disease with (acute) exacerbation: Secondary | ICD-10-CM

## 2022-02-26 MED ORDER — AZITHROMYCIN 250 MG PO TABS: ORAL_TABLET | ORAL | 0 refills | Status: AC

## 2022-02-26 MED ORDER — DILTIAZEM HCL ER BEADS 120 MG PO CP24: 120.0000 mg | ORAL_CAPSULE | Freq: Every day | ORAL | 3 refills | Status: DC

## 2022-02-26 MED ORDER — METHYLPREDNISOLONE 4 MG PO TBPK: ORAL_TABLET | ORAL | 0 refills | Status: DC

## 2022-02-27 ENCOUNTER — Encounter: Payer: Self-pay | Admitting: Family Medicine

## 2022-02-27 DIAGNOSIS — N183 Chronic kidney disease, stage 3 unspecified: Secondary | ICD-10-CM | POA: Insufficient documentation

## 2022-02-27 LAB — CBC
Hematocrit: 39.6 % (ref 37.5–51.0)
Hemoglobin: 13.2 g/dL (ref 13.0–17.7)
MCH: 29.5 pg (ref 26.6–33.0)
MCHC: 33.3 g/dL (ref 31.5–35.7)
MCV: 88 fL (ref 79–97)
Platelets: 379 10*3/uL (ref 150–450)
RBC: 4.48 x10E6/uL (ref 4.14–5.80)
RDW: 13.3 % (ref 11.6–15.4)
WBC: 13.3 10*3/uL — ABNORMAL HIGH (ref 3.4–10.8)

## 2022-02-27 LAB — COMPREHENSIVE METABOLIC PANEL
ALT: 26 IU/L (ref 0–44)
AST: 19 IU/L (ref 0–40)
Albumin/Globulin Ratio: 1.6 (ref 1.2–2.2)
Albumin: 4.6 g/dL (ref 3.8–4.8)
Alkaline Phosphatase: 119 IU/L (ref 44–121)
BUN/Creatinine Ratio: 12 (ref 10–24)
BUN: 21 mg/dL (ref 8–27)
Bilirubin Total: 0.5 mg/dL (ref 0.0–1.2)
CO2: 29 mmol/L (ref 20–29)
Calcium: 10.1 mg/dL (ref 8.6–10.2)
Chloride: 90 mmol/L — ABNORMAL LOW (ref 96–106)
Creatinine, Ser: 1.8 mg/dL — ABNORMAL HIGH (ref 0.76–1.27)
Globulin, Total: 2.8 g/dL (ref 1.5–4.5)
Glucose: 93 mg/dL (ref 70–99)
Potassium: 3.4 mmol/L — ABNORMAL LOW (ref 3.5–5.2)
Sodium: 136 mmol/L (ref 134–144)
Total Protein: 7.4 g/dL (ref 6.0–8.5)
eGFR: 42 mL/min/{1.73_m2} — ABNORMAL LOW (ref >59)

## 2022-02-27 LAB — LIPID PANEL
Chol/HDL Ratio: 6.6 ratio — ABNORMAL HIGH (ref 0.0–5.0)
Cholesterol, Total: 225 mg/dL — ABNORMAL HIGH (ref 100–199)
HDL: 34 mg/dL — ABNORMAL LOW (ref >39)
LDL Chol Calc (NIH): 139 mg/dL — ABNORMAL HIGH (ref 0–99)
Triglycerides: 285 mg/dL — ABNORMAL HIGH (ref 0–149)
VLDL Cholesterol Cal: 52 mg/dL — ABNORMAL HIGH (ref 5–40)

## 2022-02-27 LAB — HEMOGLOBIN A1C
Est. average glucose Bld gHb Est-mCnc: 123 mg/dL
Hgb A1c MFr Bld: 5.9 % — ABNORMAL HIGH (ref 4.8–5.6)

## 2022-03-02 NOTE — Progress Notes (Signed)
? ? ? ?Annual Wellness Visit ? ?  ? ?Patient: Clarence Hamilton, Male    DOB: Aug 27, 1956, 66 y.o.   MRN: [id number] ?Visit Date: 02/26/2022 ? ?Today's Provider: Mila Merry, MD  ? ?Chief Complaint  ?Patient presents with  ? Medicare Wellness  ? Anxiety  ? Hyperlipidemia  ? Prediabetes  ? Hypertension  ? ?Subjective  ?  ?Clarence Hamilton is a 66 y.o. male who presents today for his Annual Wellness Visit. ? ? ? ?Medications: ?Outpatient Medications Prior to Visit  ?Medication Sig  ? albuterol (PROVENTIL) (2.5 MG/3ML) 0.083% nebulizer solution SMARTSIG:3 Milliliter(s) Via Nebulizer Every 6 Hours PRN  ? albuterol (VENTOLIN HFA) 108 (90 Base) MCG/ACT inhaler Inhale into the lungs.  ? alprazolam (XANAX) 2 MG tablet Take 1 tablet (2 mg total) by mouth 4 (four) times daily.  ? Azelastine-Fluticasone 137-50 MCG/ACT SUSP Place 1 spray into both nostrils 2 (two) times daily.  ? busPIRone (BUSPAR) 15 MG tablet Take 1 tablet (15 mg total) by mouth 2 (two) times daily. (Patient taking differently: Take 7.5 mg by mouth in the morning, at noon, in the evening, and at bedtime.)  ? fexofenadine (ALLEGRA) 180 MG tablet Take by mouth.  ? fluticasone-salmeterol (ADVAIR) 250-50 MCG/ACT AEPB Inhale into the lungs.  ? furosemide (LASIX) 20 MG tablet TAKE 1 TABLET(20 MG) BY MOUTH DAILY AS NEEDED FOR FLUID RETENTION  ? hydrochlorothiazide (HYDRODIURIL) 25 MG tablet TAKE 1 TABLET BY MOUTH EVERY DAY  ? HYDROcodone-acetaminophen (NORCO) 10-325 MG tablet Take 1 tablet by mouth in the morning, at noon, and at bedtime.   ? ketoconazole (NIZORAL) 2 % cream Apply 1 application topically daily.  ? levalbuterol (XOPENEX) 1.25 MG/3ML nebulizer solution levalbuterol 1.25 mg/3 mL solution for nebulization ? TAKE 3 MLS BY NEBULIZATION EVERY 6 HOURS AS NEEDED FOR WHEEZING  ? losartan (COZAAR) 100 MG tablet Take 1 tablet by mouth daily.  ? meloxicam (MOBIC) 15 MG tablet TAKE 1 TABLET(15 MG) BY MOUTH DAILY  ? montelukast (SINGULAIR) 10 MG tablet Take 1  tablet by mouth daily.  ? Multiple Vitamins-Minerals (CENTRUM SILVER PO) Take 1 tablet by mouth daily.  ? mupirocin ointment (BACTROBAN) 2 % Apply 1 application topically daily.  ? naloxone (NARCAN) nasal spray 4 mg/0.1 mL Narcan 4 mg/actuation nasal spray ? Take by nasal route every 3 minutes until patient awakes or EMS arrives.  ? OXYGEN Place 3 L into the nose. At all times  ? PROAIR HFA 108 (90 Base) MCG/ACT inhaler Inhale 1 puff into the lungs every 6 (six) hours as needed.   ? rosuvastatin (CRESTOR) 20 MG tablet TAKE 1 TABLET(20 MG) BY MOUTH DAILY  ? tamsulosin (FLOMAX) 0.4 MG CAPS capsule TAKE 1 CAPSULE(0.4 MG) BY MOUTH DAILY  ? triamcinolone cream (KENALOG) 0.1 % Apply 1 application topically 2 (two) times daily.  ? valACYclovir (VALTREX) 1000 MG tablet valacyclovir 1 gram tablet ? TK 1 T PO TID  ? venlafaxine XR (EFFEXOR-XR) 75 MG 24 hr capsule TAKE 3 CAPSULES(225 MG) BY MOUTH EVERY MORNING  ? diltiazem (TIAZAC) 240 MG 24 hr capsule Take 1 tablet by mouth daily.  ? ?No facility-administered medications prior to visit.  ?  ?Allergies  ?Allergen Reactions  ? Pollen Extract Cough  ? Sulfa Antibiotics   ? Tetracycline   ?  Headache  ? Tramadol   ?  insomnia  ? Corticosteroids   ?  Other reaction(s): Muscle Pain  ? ? ?Patient Care Team: ?Malva Limes, MD as PCP - General (  Family Medicine) ?Mertie MooresFleming, Herbon E, MD as Referring Physician (Pulmonary Disease) ?Pa, [facility](Optometry) ?Sheran Luzamos, Richard, MD as Consulting Physician (Physical Medicine and Rehabilitation) ?Ranee GosselinGioffre, Ronald, MD as Consulting Physician (Orthopedic Surgery) ?Su HoffKnowles, Carol, PA-C as Physician Assistant (Chiropractic Medicine) ?Gaspar ColaFleury, Alexandre A, [facility] as Pharmacist (Pharmacist) ? ?  ? Objective  ?  ? ? ?Most recent functional status assessment: ?   ? View : No data to display.  ?  ?  ?  ? ?Most recent fall risk assessment: ? ?  02/26/2022  ?  2:40 PM  ?Fall Risk   ?Falls in the past year? 1  ?Number falls in past yr: 0  ?Injury with  Fall? 0  ?Risk for fall due to : History of fall(s)  ?Follow up Falls evaluation completed;Falls prevention discussed  ? ? Most recent depression screenings: ? ?  02/26/2022  ?  2:42 PM 08/25/2021  ?  2:53 PM  ?PHQ 2/9 Scores  ?PHQ - 2 Score 4 0  ?PHQ- 9 Score 9 3  ? ?Most recent cognitive screening: ?   ? View : No data to display.  ?  ?  ?  ? ?Most recent Audit-C alcohol use screening ? ?  02/26/2022  ?  2:39 PM  ?Alcohol Use Disorder Test (AUDIT)  ?1. How often do you have a drink containing alcohol? 0  ?2. How many drinks containing alcohol do you have on a typical day when you are drinking? 0  ?3. How often do you have six or more drinks on one occasion? 0  ?AUDIT-C Score 0  ? ?A score of 3 or more in women, and 4 or more in men indicates increased risk for alcohol abuse, EXCEPT if all of the points are from question 1  ? ? ? Assessment & Plan  ?  ? ?Annual wellness visit done today including the all of the following: ?Reviewed patient's Family Medical History ?Reviewed and updated list of patient's medical providers ?Assessment of cognitive impairment was done ?Assessed patient's functional ability ?Established a written schedule for health screening services ?Health Risk Assessent Completed and Reviewed ? ?Exercise Activities and Dietary recommendations ? Goals   ? ?  LIFESTYLE - DECREASE FALLS RISK   ?  Recommend to remove any items from the home that may cause slips or trips. ?  ?  Track and Manage My Blood Pressure-Hypertension   ?  Timeframe:  Long-Range Goal ?Priority:  High ?Start Date: 07/24/21                             ?Expected End Date: 07/24/22                      ? ?Follow Up within 90 days ?  ?- check blood pressure weekly ?- write blood pressure results in a log or diary  ?  ?Why is this important?   ?You won't feel high blood pressure, but it can still hurt your blood vessels.  ?High blood pressure can cause heart or kidney problems. It can also cause a stroke.  ?Making lifestyle changes like  losing a little weight or eating less salt will help.  ?Checking your blood pressure at home and at different times of the day can help to control blood pressure.  ?If the doctor prescribes medicine remember to take it the way the doctor ordered.  ?Call the office if you cannot afford the medicine or  if there are questions about it.   ?  ?Notes:  ?  ?  Track and Manage My Symptoms-Asthma   ?  Timeframe:  Long-Range Goal ?Priority:  High ?Start Date:   12/16/2020                          ?Expected End Date:  06/01/2022                    ? ?Follow Up within 90 days ?  ?- avoid symptom triggers outdoors ?- follow asthma action plan ?- keep rescue medicines on hand  ?  ?Why is this important?   ?Keeping track of asthma symptoms can tell you a lot about your asthma control.  ?Based on symptoms and peak flow results you can see how well you are doing.  ?Your asthma action plan has a green, yellow and red zone. Green means all is good; it is your goal. Yellow means your symptoms are a little worse. You will need to adjust your medications. Being in the red zone means that your   ?asthma is out of control. You will need to use your rescue medicines. You may need emergency care.   ?  ?Notes:  ?  ? ?  ? ? ?Immunization History  ?Administered Date(s) Administered  ? H1N1 08/29/2008  ? Influenza Split 10/03/2014  ? Influenza,inj,Quad PF,6+ Mos 08/19/2020, 08/25/2021  ? Influenza-Unspecified 09/07/2017, 07/25/2018, 07/31/2019  ? PFIZER Comirnaty(Gray Top)Covid-19 Tri-Sucrose Vaccine 01/10/2020, 01/31/2020  ? PFIZER(Purple Top)SARS-COV-2 Vaccination 12/17/2020  ? PNEUMOCOCCAL CONJUGATE-20 02/26/2022  ? Tdap 02/17/2016  ? ? ?Health Maintenance  ?Topic Date Due  ? HIV Screening  Never done  ? Zoster Vaccines- Shingrix (1 of 2) Never done  ? COVID-19 Vaccine (4 - Booster for Pfizer series) 02/11/2021  ? INFLUENZA VACCINE  05/25/2022  ? Fecal DNA (Cologuard)  01/25/2023  ? TETANUS/TDAP  02/16/2026  ? Hepatitis C Screening  Completed  ?  HPV VACCINES  Aged Out  ? ? ? ?Discussed health benefits of physical activity, and encouraged him to engage in regular exercise appropriate for his age and condition.  ?  ? ?  ? ?The entirety of the informat

## 2022-03-03 ENCOUNTER — Telehealth: Payer: Self-pay | Admitting: *Deleted

## 2022-03-03 NOTE — Telephone Encounter (Signed)
Patient called to get his lab results. ?Patient reports SE from prednisone- interring with his anxiety medication- patient is going to stop dose pack- only has 2 doses left. Patient states his chest/breathing is better. Will continue antibiotic. Wants provider to be aware and will take any suggestions for other treatment. ? ?Patient is having problems with identity thief- increased stress- offered appointment -patient declined appointment.  ? ?Patient request updated medication list mailed to him. ? ?Requested call back on home number until he has cleared his identity theft up.   ?

## 2022-03-04 NOTE — Telephone Encounter (Addendum)
I don't understand what he is asking for, or if this is just an FYI ?

## 2022-03-15 ENCOUNTER — Other Ambulatory Visit: Payer: Self-pay | Admitting: Family Medicine

## 2022-03-20 ENCOUNTER — Other Ambulatory Visit: Payer: Self-pay | Admitting: Family Medicine

## 2022-03-20 DIAGNOSIS — F419 Anxiety disorder, unspecified: Secondary | ICD-10-CM

## 2022-04-30 ENCOUNTER — Telehealth: Payer: Self-pay

## 2022-04-30 NOTE — Progress Notes (Signed)
Chronic Care Management APPOINTMENT REMINDER   Called Clarence Hamilton, No answer, Unable to leave a voice message to remind patient of appointment on 05/03/2022 at 3:45 pm via telephone visit with Angelena Sole , Selinda Michaels Clinical Pharmacist Assistant [phone]

## 2022-05-03 ENCOUNTER — Ambulatory Visit (INDEPENDENT_AMBULATORY_CARE_PROVIDER_SITE_OTHER): Payer: Medicare PPO

## 2022-05-03 DIAGNOSIS — I10 Essential (primary) hypertension: Secondary | ICD-10-CM

## 2022-05-03 DIAGNOSIS — E782 Mixed hyperlipidemia: Secondary | ICD-10-CM

## 2022-05-03 MED ORDER — HYDROCHLOROTHIAZIDE 25 MG PO TABS: 25.0000 mg | ORAL_TABLET | Freq: Every day | ORAL | 3 refills | Status: DC

## 2022-05-03 MED ORDER — ROSUVASTATIN CALCIUM 20 MG PO TABS: 20.0000 mg | ORAL_TABLET | Freq: Every day | ORAL | 3 refills | Status: DC

## 2022-05-03 NOTE — Progress Notes (Signed)
Chronic Care Management Pharmacy Note  05/21/2022 Name:  Clarence Hamilton MRN:  [id number] DOB:  [birth year]  Summary: Patient presents for CCM follow-up.   Recommendations/Changes made from today's visit: Continue current medications  Plan: CPP follow-up 3 months  Subjective: Clarence Hamilton is an 66 y.o. year old male who is a primary patient of Fisher, Kirstie Peri, MD.  The CCM team was consulted for assistance with disease management and care coordination needs.    Engaged with patient by telephone for follow up visit in response to provider referral for pharmacy case management and/or care coordination services.   Consent to Services:  The patient was given information about Chronic Care Management services, agreed to services, and gave verbal consent prior to initiation of services.  Please see initial visit note for detailed documentation.   Patient Care Team: Birdie Sons, MD as PCP - General (Family Medicine) Erby Pian, MD as Referring Physician (Pulmonary Disease) Pa, Muir (Optometry) Suella Broad, MD as Consulting Physician (Physical Medicine and Rehabilitation) Latanya Maudlin, MD as Consulting Physician (Orthopedic Surgery) Levy Pupa, PA-C as Physician Assistant ([place]) Germaine Pomfret, [facility] as Pharmacist (Pharmacist)  Recent office visits: 02/26/22: Patient presented to Dr. Caryn Section. LDL 139. eGFR 42. Azithromycin, methylprednisolone for COPD exacerbation.   Recent consult visits: None in past 6 months   Hospital visits: None in previous 6 months  Objective:  Lab Results  Component Value Date   CREATININE 1.80 (H) 02/26/2022   BUN 21 02/26/2022   GFRNONAA 69 02/20/2020   GFRAA 79 02/20/2020   NA 136 02/26/2022   K 3.4 (L) 02/26/2022   CALCIUM 10.1 02/26/2022   CO2 29 02/26/2022    Lab Results  Component Value Date/Time   HGBA1C 5.9 (H) 02/26/2022 04:06 PM   HGBA1C 6.1 (H) 08/25/2021 03:40 PM     Last diabetic Eye exam: No results found for: "HMDIABEYEEXA"  Last diabetic Foot exam: No results found for: "HMDIABFOOTEX"   Lab Results  Component Value Date   CHOL 225 (H) 02/26/2022   HDL 34 (L) 02/26/2022   LDLCALC 139 (H) 02/26/2022   TRIG 285 (H) 02/26/2022   CHOLHDL 6.6 (H) 02/26/2022       Latest Ref Rng & Units 02/26/2022    4:06 PM 08/25/2021    3:40 PM 02/17/2021    4:18 PM  Hepatic Function  Total Protein 6.0 - 8.5 g/dL 7.4  6.7  7.1   Albumin 3.8 - 4.8 g/dL 4.6  4.3  4.4   AST 0 - 40 IU/L 19  17  20    ALT 0 - 44 IU/L 26  29  29    Alk Phosphatase 44 - 121 IU/L 119  101  121   Total Bilirubin 0.0 - 1.2 mg/dL 0.5  0.2  0.3     Lab Results  Component Value Date/Time   TSH 1.200 02/20/2020 10:37 AM   TSH 1.490 03/31/2016 10:20 AM       Latest Ref Rng & Units 02/26/2022    4:06 PM 02/17/2021    4:18 PM 02/20/2020   10:37 AM  CBC  WBC 3.4 - 10.8 x10E3/uL 13.3  10.4  12.8   Hemoglobin 13.0 - 17.7 g/dL 13.2  13.5  13.1   Hematocrit 37.5 - 51.0 % 39.6  41.7  39.2   Platelets 150 - 450 x10E3/uL 379  311  286     No results found for: "VD25OH"  Clinical ASCVD: Yes  The  10-year ASCVD risk score (Arnett DK, et al., 2019) is: 13.5%   Values used to calculate the score:     Age: 75 years     Sex: Male     Is Non-Hispanic African American: No     Diabetic: No     Tobacco smoker: No     Systolic Blood Pressure: 025 mmHg     Is BP treated: Yes     HDL Cholesterol: 34 mg/dL     Total Cholesterol: 225 mg/dL       02/26/2022    2:42 PM 08/25/2021    2:53 PM 08/19/2020    4:17 PM  Depression screen PHQ 2/9  Decreased Interest 3 0 0  Down, Depressed, Hopeless 1 0 0  PHQ - 2 Score 4 0 0  Altered sleeping 1 1 0  Tired, decreased energy 0 1 0  Change in appetite 3 0 1  Feeling bad or failure about yourself  1 0 0  Trouble concentrating 0 1 0  Moving slowly or fidgety/restless 0 0 0  Suicidal thoughts 0 0 0  PHQ-9 Score 9 3 1   Difficult doing work/chores Very  difficult Somewhat difficult Not difficult at all    Social History   Tobacco Use  Smoking Status Never  Smokeless Tobacco Never   BP Readings from Last 3 Encounters:  02/26/22 105/60  08/25/21 114/68  07/13/21 103/63   Pulse Readings from Last 3 Encounters:  02/26/22 97  08/25/21 93  07/13/21 89   Wt Readings from Last 3 Encounters:  02/26/22 230 lb (104.3 kg)  07/13/21 280 lb (127 kg)  04/20/21 270 lb (122.5 kg)    Assessment/Interventions: Review of patient past medical history, allergies, medications, health status, including review of consultants reports, laboratory and other test data, was performed as part of comprehensive evaluation and provision of chronic care management services.   SDOH:  (Social Determinants of Health) assessments and interventions performed: Yes      CCM Care Plan  Allergies  Allergen Reactions   Pollen Extract Cough   Sulfa Antibiotics    Tetracycline     Headache   Tramadol     insomnia   Corticosteroids     Other reaction(s): Muscle Pain    Medications Reviewed Today     Reviewed by Randal Buba, CMA (Certified Medical Assistant) on 02/26/22 at 1438  Med List Status: <None>   Medication Order Taking? Sig Documenting Provider Last Dose Status Informant  albuterol (PROVENTIL) (2.5 MG/3ML) 0.083% nebulizer solution [id number] Yes Inhale into the lungs. [provider] Taking Active   albuterol (PROVENTIL) (2.5 MG/3ML) 0.083% nebulizer solution [id number] Yes SMARTSIG:3 Milliliter(s) Via Nebulizer Every 6 Hours PRN [provider] Taking Active   albuterol (VENTOLIN HFA) 108 (90 Base) MCG/ACT inhaler [id number] Yes Inhale into the lungs. [provider] Taking Active   alprazolam Duanne Moron) 2 MG tablet [id number] Yes Take 1 tablet (2 mg total) by mouth 4 (four) times daily. Birdie Sons, MD Taking Active   Azelastine-Fluticasone 137-50 MCG/ACT Elizbeth Squires [id number] Yes Place 1 spray into both nostrils 2 (two)  times daily. [provider] Taking Active            Med Note Josiah Lobo, Deveron Furlong   Mon Feb 16, 2016 12:04 PM) Received from: [place]: Place into the nose.  busPIRone (BUSPAR) 15 MG tablet [id number] Yes Take 1 tablet (15 mg total) by mouth 2 (two) times daily. Birdie Sons, MD Taking  Active   busPIRone (BUSPAR) 15 MG tablet [id number] Yes take 1/2 tablet by mouth four times a day [provider] Taking Active   cefdinir (OMNICEF) 300 MG capsule [id number] Yes cefdinir 300 mg capsule [provider] Taking Active   cephALEXin (KEFLEX) 500 MG capsule [id number] Yes cephalexin 500 mg capsule [provider] Taking Active   cyclobenzaprine (FLEXERIL) 5 MG tablet [id number] Yes TAKE 1 TO 2 TABLETS(5 TO 10 MG) BY MOUTH THREE TIMES DAILY AS NEEDED FOR MUSCLE SPASMS [provider] Taking Active   diltiazem (CARDIZEM CD) 240 MG 24 hr capsule [id number] Yes Take 1 capsule by mouth daily. bedtime [provider] Taking Active            Med Note Josiah Lobo, ROSHENA L   Mon Feb 16, 2016 12:04 PM) Received from: [place]: Take by mouth.  diltiazem (TIAZAC) 240 MG 24 hr capsule [id number] Yes Take 1 tablet by mouth daily. [provider] Taking Active   fexofenadine (ALLEGRA) 180 MG tablet [id number] Yes Take by mouth. [provider] Taking Active   fluticasone-salmeterol (ADVAIR) 250-50 MCG/ACT AEPB [id number] Yes Inhale into the lungs. [provider] Taking Active   Fluticasone-Salmeterol (ADVAIR) 250-50 MCG/DOSE AEPB [id number] Yes Inhale 1 puff into the lungs 2 (two) times daily. [provider] Taking Active Self  furosemide (LASIX) 20 MG tablet [id number] Yes TAKE 1 TABLET(20 MG) BY MOUTH DAILY AS NEEDED FOR FLUID RETENTION Birdie Sons, MD Taking Active   hydrochlorothiazide (HYDRODIURIL) 25 MG tablet [id number] Yes TAKE 1 TABLET BY MOUTH EVERY DAY  Birdie Sons, MD Taking Active   hydrochlorothiazide (HYDRODIURIL) 25 MG tablet [id number] Yes hydrochlorothiazide 25 mg tablet  TAKE 1 TABLET BY MOUTH DAILY [provider] Taking Active   HYDROcodone-acetaminophen (NORCO) 10-325 MG tablet [id number] Yes Take 1 tablet by mouth in the morning, at noon, and at bedtime.  [provider] Taking Active   ketoconazole (NIZORAL) 2 % cream [id number] Yes Apply 1 application topically daily. Birdie Sons, MD Taking Active   levalbuterol Penne Lash) 1.25 MG/3ML nebulizer solution [id number] Yes levalbuterol 1.25 mg/3 mL solution for nebulization  TAKE 3 MLS BY NEBULIZATION EVERY 6 HOURS AS NEEDED FOR WHEEZING [provider] Taking Active   losartan (COZAAR) 100 MG tablet [id number] Yes Take 100 mg by mouth daily. [provider] Taking Active   losartan (COZAAR) 100 MG tablet [id number] Yes Take 1 tablet by mouth daily. [provider] Taking Active   meloxicam (MOBIC) 15 MG tablet [id number] Yes TAKE 1 TABLET(15 MG) BY MOUTH DAILY Birdie Sons, MD Taking Active   meloxicam (MOBIC) 15 MG tablet [id number] Yes meloxicam 15 mg tablet  Take 1 tablet every day by oral route. [provider] Taking Active   methylPREDNISolone (MEDROL DOSEPAK) 4 MG TBPK tablet [id number] Yes See admin instructions. follow package directions [provider] Taking Active   montelukast (SINGULAIR) 10 MG tablet [id number] Yes Take 1 tablet by mouth daily. [provider] Taking Active            Med Note Josiah Lobo, Deveron Furlong   Mon Feb 16, 2016 12:04 PM) Received from: [place]:   Multiple Vitamins-Minerals (CENTRUM SILVER PO) [id number] Yes Take 1 tablet by mouth daily. [provider] Taking Active            Med Note Josiah Lobo, Deveron Furlong   Mon Feb 16, 2016 12:04 PM) Received from: [place]  Sig:   mupirocin ointment (BACTROBAN) 2 %  [id number] Yes Apply 1 application topically daily. Virginia Crews, MD Taking Active   mupirocin ointment (BACTROBAN) 2 % [id number] Yes Apply topically. [provider] Taking Active   naloxone [facility]) nasal spray 4 mg/0.1 mL [id number] Yes Narcan 4 mg/actuation nasal spray  Take by nasal route every 3 minutes until patient awakes or EMS arrives. [provider] Taking Active   Niacin CR 1000 MG TBCR [id number] Yes  [provider] Taking Active   OXYGEN [id number] Yes Place 3 L into the nose. At all times [provider] Taking Active   potassium chloride SA (KLOR-CON) 20 MEQ tablet [id number] Yes TAKE 1 TABLET BY MOUTH EVERY DAY Fisher, Kirstie Peri, MD Taking Active   predniSONE (DELTASONE) 10 MG tablet [id number] Yes Prednisone 10 mg, 4 pills q day x 2 days, 3 pills q day x 2 days, 2 pills q day x 2 days, 1 pill q day x 2 days. [provider] Taking Active   PROAIR HFA 108 [phone] Base) MCG/ACT inhaler [id number] Yes Inhale 1 puff into the lungs every 6 (six) hours as needed.  [provider] Taking Active            Med Note Sabra Heck, Emmaline Kluver M   Tue Feb 17, 2016  2:20 PM) Received from: External Pharmacy  rosuvastatin (CRESTOR) 20 MG tablet [id number] Yes TAKE 1 TABLET(20 MG) BY MOUTH DAILY Birdie Sons, MD Taking Active   simvastatin (ZOCOR) 20 MG tablet [id number] Yes  [provider] Taking Active   simvastatin (ZOCOR) 40 MG tablet [id number] Yes simvastatin 40 mg tablet [provider] Taking Active   tamsulosin (FLOMAX) 0.4 MG CAPS capsule [id number] Yes TAKE 1 CAPSULE(0.4 MG) BY MOUTH DAILY Stoioff, Scott C, MD Taking Active   triamcinolone (NASACORT) 55 MCG/ACT AERO nasal inhaler [id number] Yes Place into the nose. [provider] Taking Active   triamcinolone cream (KENALOG) 0.1 % [id number] Yes Apply 1 application topically 2 (two) times daily. Birdie Sons, MD Taking Active   valACYclovir (VALTREX) 1000 MG  tablet [id number] Yes valacyclovir 1 gram tablet  TK 1 T PO TID [provider] Taking Active   venlafaxine XR (EFFEXOR-XR) 75 MG 24 hr capsule [id number] Yes TAKE 3 CAPSULES(225 MG) BY MOUTH EVERY MORNING Fisher, Kirstie Peri, MD Taking Active   venlafaxine XR (EFFEXOR-XR) 75 MG 24 hr capsule [id number] Yes Take 3 capsules by mouth every morning. [provider] Taking Active             Patient Active Problem List   Diagnosis Date Noted   Chronic kidney disease, stage 3 unspecified ([place]) 02/27/2022   Lumbar radiculopathy 10/31/2021   Chronic respiratory failure with hypoxia ([place]) 02/17/2021   ILD (interstitial lung disease) ([place]) 12/25/2019   Osteoarthritis of left knee 05/16/2019   Osteoarthritis of right knee 05/16/2019   Pain in right knee 08/18/2018   Lumbar spondylosis 07/20/2018   Degeneration of lumbar intervertebral disc 03/23/2018   Aortic atherosclerosis ([place]) 10/14/2017   Elevated PSA 04/01/2016   Morbid obesity, unspecified obesity type ([place]) 02/16/2016   Dermatitis, stasis 02/06/2015   Bilateral leg edema 02/06/2015   Chronic venous stasis dermatitis of both lower extremities 02/06/2015   Prediabetes 04/30/2014   Hyperlipidemia, mixed 09/05/2008   Hypertension 02/13/2008   Anxiety disorder 10/25/1998   Asthma, intrinsic 10/25/1968    Immunization History  Administered Date(s) Administered   H1N1 08/29/2008   Influenza Split 10/03/2014  Influenza,inj,Quad PF,6+ Mos 08/19/2020, 08/25/2021   Influenza-Unspecified 09/07/2017, 07/25/2018, 07/31/2019   PFIZER Comirnaty(Gray Top)Covid-19 Tri-Sucrose Vaccine 01/10/2020, 01/31/2020   PFIZER(Purple Top)SARS-COV-2 Vaccination 12/17/2020   PNEUMOCOCCAL CONJUGATE-20 02/26/2022   Tdap 02/17/2016    Conditions to be addressed/monitored:  Hypertension, Hyperlipidemia, Asthma, Anxiety, Osteoarthritis and Prediabetes  Care Plan : General Pharmacy (Adult)  Updates made by Germaine Pomfret, RPH since  05/21/2022 12:00 AM     Problem: Hypertension, Hyperlipidemia, Asthma, Anxiety, Osteoarthritis and Prediabetes   Priority: High     Long-Range Goal: Patient-Specific Goal   Start Date: 12/17/2020  Expected End Date: 05/22/2023  This Visit's Progress: On track  Recent Progress: On track  Priority: High  Note:   Current Barriers:  Unable to independently monitor therapeutic efficacy  Pharmacist Clinical Goal(s):  Over the next 90 days, patient will maintain control of blood pressure  as evidenced by BP less than 130/80  through collaboration with PharmD and provider.   Interventions: 1:1 collaboration with Birdie Sons, MD regarding development and update of comprehensive plan of care as evidenced by provider attestation and co-signature Inter-disciplinary care team collaboration (see longitudinal plan of care) Comprehensive medication review performed; medication list updated in electronic medical record  Hypertension (BP goal <130/80) -Controlled -Current treatment: Diltiazem CD 120 mg daily  Furosemide 20 mg daily  HCTZ 25 mg daily  Losartan 100 mg daily  -Medications previously tried: NA  -Current home readings: NA,  -Current dietary habits: Does try to limit salt. -Current exercise habits: Limited by osteoarthritis pain.  -Denies hypotensive/hypertensive symptoms -Recommended to continue current medication  Hyperlipidemia: (LDL goal < 70) -Controlled -Current treatment: Rosuvastatin 20 mg nightly  -Medications previously tried: NA  -Educated on Benefits of statin for ASCVD risk reduction; -Tolerating rosuvastatin well, saw marked improvement in cholesterol reduction.  Continue current medications  Asthma/ Interstitial Lung Disease/ Chronic Respiratory Failure (Goal: control symptoms and prevent exacerbations) -Managed by Dr. Raul Del  -controlled -Current treatment  Dymista 137-60 mcg/act 1 spray twice daily  Flonase nasal spray  Advair 250-50 mcg/act 1 puff  twice daily  Monteluakst 10 mg nightly  ProAir HFA  Oxygen 3L -Medications previously tried: NA  -Exacerbations requiring treatment in last 6 months: None -Patient reports consistent use of maintenance inhaler -Frequency of rescue inhaler use: infrequently -Continue current medications  Chronic Pain (Goal: Provide adequate pain relief) -Controlled  -Managed by Levy Pupa, PA-C and Dr. Nelva Bush -Receives knee injections every 6 months  -Current treatment   Hydrocodone-APAP 10-325 mg three times daily  Meloxicam 15 mg daily  -Medications previously tried: NA   -Recommended to continue current medication  Depression/Anxiety (Goal: Maintain stable mood) -Controlled -Current treatment: Alprazolam 2 mg four times daily  Buspirone 15 mg 1/2 tablet three times daily  Venlafaxine XR 75 mg 3 capsules daily  -Medications previously tried/failed: NA -PHQ9: 1 -GAD7: NA -Educated on Benefits of medication for symptom control -Recommended to continue current medication  Patient Goals/Self-Care Activities Over the next 90 days, patient will:  - check blood pressure weekly, document, and provide at future appointments  Follow Up Plan: Telephone follow up appointment with care management team member scheduled for:  08/03/2022 at 3:00 PM    Medication Assistance: None required.  Patient affirms current coverage meets needs.  Patient's preferred pharmacy is:  [facility] [place], [place] [address] [place] [address] Phone: [phone] Fax: [phone]  Uses pill box?  Yes Pt endorses 100% compliance  We discussed: Current pharmacy is preferred with insurance plan and patient is satisfied with pharmacy services Patient decided to: Continue current medication management strategy  Care Plan and Follow Up Patient Decision:  Patient agrees to Care Plan and Follow-up.  Plan: Telephone  follow up appointment with care management team member scheduled for:  08/03/2022 at 3:00 PM  Junius Argyle, PharmD, [place], [place] [phone]

## 2022-05-05 DIAGNOSIS — J449 Chronic obstructive pulmonary disease, unspecified: Secondary | ICD-10-CM | POA: Diagnosis not present

## 2022-05-05 DIAGNOSIS — J45909 Unspecified asthma, uncomplicated: Secondary | ICD-10-CM | POA: Diagnosis not present

## 2022-05-21 NOTE — Patient Instructions (Signed)
Visit Information It was great speaking with you today!  Please let me know if you have any questions about our visit.   Goals Addressed             This Visit's Progress    Track and Manage My Blood Pressure-Hypertension   On track    Timeframe:  Long-Range Goal Priority:  High Start Date: 07/24/21                             Expected End Date: 07/25/23                      Follow Up within 90 days   - check blood pressure weekly - write blood pressure results in a log or diary    Why is this important?   You won't feel high blood pressure, but it can still hurt your blood vessels.  High blood pressure can cause heart or kidney problems. It can also cause a stroke.  Making lifestyle changes like losing a little weight or eating less salt will help.  Checking your blood pressure at home and at different times of the day can help to control blood pressure.  If the doctor prescribes medicine remember to take it the way the doctor ordered.  Call the office if you cannot afford the medicine or if there are questions about it.     Notes:      Track and Manage My Symptoms-Asthma   On track    Timeframe:  Long-Range Goal Priority:  High Start Date:   12/16/2020                          Expected End Date:  06/02/2023                     Follow Up within 90 days   - avoid symptom triggers outdoors - follow asthma action plan - keep rescue medicines on hand    Why is this important?   Keeping track of asthma symptoms can tell you a lot about your asthma control.  Based on symptoms and peak flow results you can see how well you are doing.  Your asthma action plan has a green, yellow and red zone. Green means all is good; it is your goal. Yellow means your symptoms are a little worse. You will need to adjust your medications. Being in the red zone means that your   asthma is out of control. You will need to use your rescue medicines. You may need emergency care.     Notes:          Patient Care Plan: General Pharmacy (Adult)     Problem Identified: Hypertension, Hyperlipidemia, Asthma, Anxiety, Osteoarthritis and Prediabetes   Priority: High     Long-Range Goal: Patient-Specific Goal   Start Date: 12/17/2020  Expected End Date: 05/22/2023  This Visit's Progress: On track  Recent Progress: On track  Priority: High  Note:   Current Barriers:  Unable to independently monitor therapeutic efficacy  Pharmacist Clinical Goal(s):  Over the next 90 days, patient will maintain control of blood pressure  as evidenced by BP less than 130/80  through collaboration with PharmD and provider.   Interventions: 1:1 collaboration with Malva Limes, MD regarding development and update of comprehensive plan of care as evidenced by provider attestation and co-signature Inter-disciplinary care  team collaboration (see longitudinal plan of care) Comprehensive medication review performed; medication list updated in electronic medical record  Hypertension (BP goal <130/80) -Controlled -Current treatment: Diltiazem CD 120 mg daily  Furosemide 20 mg daily  HCTZ 25 mg daily  Losartan 100 mg daily  -Medications previously tried: NA  -Current home readings: NA,  -Current dietary habits: Does try to limit salt. -Current exercise habits: Limited by osteoarthritis pain.  -Denies hypotensive/hypertensive symptoms -Recommended to continue current medication  Hyperlipidemia: (LDL goal < 70) -Controlled -Current treatment: Rosuvastatin 20 mg nightly  -Medications previously tried: NA  -Educated on Benefits of statin for ASCVD risk reduction; -Tolerating rosuvastatin well, saw marked improvement in cholesterol reduction.  Continue current medications  Asthma/ Interstitial Lung Disease/ Chronic Respiratory Failure (Goal: control symptoms and prevent exacerbations) -Managed by Dr. Meredeth Ide  -controlled -Current treatment  Dymista 137-60 mcg/act 1 spray twice daily  Flonase  nasal spray  Advair 250-50 mcg/act 1 puff twice daily  Monteluakst 10 mg nightly  ProAir HFA  Oxygen 3L -Medications previously tried: NA  -Exacerbations requiring treatment in last 6 months: None -Patient reports consistent use of maintenance inhaler -Frequency of rescue inhaler use: infrequently -Continue current medications  Chronic Pain (Goal: Provide adequate pain relief) -Controlled  -Managed by Su Hoff, PA-C and Dr. Ethelene Hal -Receives knee injections every 6 months  -Current treatment   Hydrocodone-APAP 10-325 mg three times daily  Meloxicam 15 mg daily  -Medications previously tried: NA   -Recommended to continue current medication  Depression/Anxiety (Goal: Maintain stable mood) -Controlled -Current treatment: Alprazolam 2 mg four times daily  Buspirone 15 mg 1/2 tablet three times daily  Venlafaxine XR 75 mg 3 capsules daily  -Medications previously tried/failed: NA -PHQ9: 1 -GAD7: NA -Educated on Benefits of medication for symptom control -Recommended to continue current medication  Patient Goals/Self-Care Activities Over the next 90 days, patient will:  - check blood pressure weekly, document, and provide at future appointments  Follow Up Plan: Telephone follow up appointment with care management team member scheduled for:  08/03/2022 at 3:00 PM      Patient agreed to services and verbal consent obtained.   The patient verbalized understanding of instructions, educational materials, and care plan provided today and DECLINED offer to receive copy of patient instructions, educational materials, and care plan.   Angelena Sole, PharmD, P[place]y [place], CPP  Clinical Pharmacist Practitioner  [facility] [phone]

## 2022-05-24 DIAGNOSIS — E782 Mixed hyperlipidemia: Secondary | ICD-10-CM | POA: Diagnosis not present

## 2022-05-24 DIAGNOSIS — I10 Essential (primary) hypertension: Secondary | ICD-10-CM

## 2022-06-05 DIAGNOSIS — J45909 Unspecified asthma, uncomplicated: Secondary | ICD-10-CM | POA: Diagnosis not present

## 2022-06-05 DIAGNOSIS — J449 Chronic obstructive pulmonary disease, unspecified: Secondary | ICD-10-CM | POA: Diagnosis not present

## 2022-06-21 ENCOUNTER — Other Ambulatory Visit: Payer: Self-pay | Admitting: Family Medicine

## 2022-06-21 DIAGNOSIS — G894 Chronic pain syndrome: Secondary | ICD-10-CM | POA: Diagnosis not present

## 2022-06-21 DIAGNOSIS — M5416 Radiculopathy, lumbar region: Secondary | ICD-10-CM | POA: Diagnosis not present

## 2022-06-21 DIAGNOSIS — M5136 Other intervertebral disc degeneration, lumbar region: Secondary | ICD-10-CM | POA: Diagnosis not present

## 2022-06-21 MED ORDER — TAMSULOSIN HCL 0.4 MG PO CAPS: ORAL_CAPSULE | ORAL | 1 refills | Status: DC

## 2022-07-22 DIAGNOSIS — Z79899 Other long term (current) drug therapy: Secondary | ICD-10-CM | POA: Diagnosis not present

## 2022-07-22 DIAGNOSIS — M5416 Radiculopathy, lumbar region: Secondary | ICD-10-CM | POA: Diagnosis not present

## 2022-07-22 DIAGNOSIS — Z5181 Encounter for therapeutic drug level monitoring: Secondary | ICD-10-CM | POA: Diagnosis not present

## 2022-08-03 ENCOUNTER — Telehealth: Payer: Medicare PPO

## 2022-08-03 NOTE — Progress Notes (Deleted)
Chronic Care Management Pharmacy Note  08/03/2022 Name:  Clarence Hamilton MRN:  [id number] DOB:  03-25-1956  Summary: Patient presents for CCM follow-up.   Recommendations/Changes made from today's visit: Continue current medications  Plan: CPP follow-up 3 months  Subjective: Clarence Hamilton is an 66 y.o. year old male who is a primary patient of Fisher, Kirstie Peri, MD.  The CCM team was consulted for assistance with disease management and care coordination needs.    Engaged with patient by telephone for follow up visit in response to provider referral for pharmacy case management and/or care coordination services.   Consent to Services:  The patient was given information about Chronic Care Management services, agreed to services, and gave verbal consent prior to initiation of services.  Please see initial visit note for detailed documentation.   Patient Care Team: Birdie Sons, MD as PCP - General (Family Medicine) Erby Pian, MD as Referring Physician (Pulmonary Disease) Pa, [place] (Optometry) Suella Broad, MD as Consulting Physician (Physical Medicine and Rehabilitation) Latanya Maudlin, MD as Consulting Physician (Orthopedic Surgery) Levy Pupa, PA-C as Physician Assistant ([place]) Germaine Pomfret, [facility] as Pharmacist (Pharmacist)  Recent office visits: 02/26/22: Patient presented to Dr. Caryn Section. LDL 139. eGFR 42. Azithromycin, methylprednisolone for COPD exacerbation.   Recent consult visits: None in past 6 months   Hospital visits: None in previous 6 months  Objective:  Lab Results  Component Value Date   CREATININE 1.80 (H) 02/26/2022   BUN 21 02/26/2022   GFRNONAA 69 02/20/2020   GFRAA 79 02/20/2020   NA 136 02/26/2022   K 3.4 (L) 02/26/2022   CALCIUM 10.1 02/26/2022   CO2 29 02/26/2022    Lab Results  Component Value Date/Time   HGBA1C 5.9 (H) 02/26/2022 04:06 PM   HGBA1C 6.1 (H) 08/25/2021 03:40 PM     Last diabetic Eye exam: No results found for: "HMDIABEYEEXA"  Last diabetic Foot exam: No results found for: "HMDIABFOOTEX"   Lab Results  Component Value Date   CHOL 225 (H) 02/26/2022   HDL 34 (L) 02/26/2022   LDLCALC 139 (H) 02/26/2022   TRIG 285 (H) 02/26/2022   CHOLHDL 6.6 (H) 02/26/2022       Latest Ref Rng & Units 02/26/2022    4:06 PM 08/25/2021    3:40 PM 02/17/2021    4:18 PM  Hepatic Function  Total Protein 6.0 - 8.5 g/dL 7.4  6.7  7.1   Albumin 3.8 - 4.8 g/dL 4.6  4.3  4.4   AST 0 - 40 IU/L 19  17  20    ALT 0 - 44 IU/L 26  29  29    Alk Phosphatase 44 - 121 IU/L 119  101  121   Total Bilirubin 0.0 - 1.2 mg/dL 0.5  0.2  0.3     Lab Results  Component Value Date/Time   TSH 1.200 02/20/2020 10:37 AM   TSH 1.490 03/31/2016 10:20 AM       Latest Ref Rng & Units 02/26/2022    4:06 PM 02/17/2021    4:18 PM 02/20/2020   10:37 AM  CBC  WBC 3.4 - 10.8 x10E3/uL 13.3  10.4  12.8   Hemoglobin 13.0 - 17.7 g/dL 13.2  13.5  13.1   Hematocrit 37.5 - 51.0 % 39.6  41.7  39.2   Platelets 150 - 450 x10E3/uL 379  311  286     No results found for: "VD25OH"  Clinical ASCVD: Yes  The  10-year ASCVD risk score (Arnett DK, et al., 2019) is: 14.3%   Values used to calculate the score:     Age: 76 years     Sex: Male     Is Non-Hispanic African American: No     Diabetic: No     Tobacco smoker: No     Systolic Blood Pressure: 354 mmHg     Is BP treated: Yes     HDL Cholesterol: 34 mg/dL     Total Cholesterol: 225 mg/dL       02/26/2022    2:42 PM 08/25/2021    2:53 PM 08/19/2020    4:17 PM  Depression screen PHQ 2/9  Decreased Interest 3 0 0  Down, Depressed, Hopeless 1 0 0  PHQ - 2 Score 4 0 0  Altered sleeping 1 1 0  Tired, decreased energy 0 1 0  Change in appetite 3 0 1  Feeling bad or failure about yourself  1 0 0  Trouble concentrating 0 1 0  Moving slowly or fidgety/restless 0 0 0  Suicidal thoughts 0 0 0  PHQ-9 Score 9 3 1   Difficult doing work/chores Very  difficult Somewhat difficult Not difficult at all    Social History   Tobacco Use  Smoking Status Never  Smokeless Tobacco Never   BP Readings from Last 3 Encounters:  02/26/22 105/60  08/25/21 114/68  07/13/21 103/63   Pulse Readings from Last 3 Encounters:  02/26/22 97  08/25/21 93  07/13/21 89   Wt Readings from Last 3 Encounters:  02/26/22 230 lb (104.3 kg)  07/13/21 280 lb (127 kg)  04/20/21 270 lb (122.5 kg)    Assessment/Interventions: Review of patient past medical history, allergies, medications, health status, including review of consultants reports, laboratory and other test data, was performed as part of comprehensive evaluation and provision of chronic care management services.   SDOH:  (Social Determinants of Health) assessments and interventions performed: Yes SDOH Interventions    Flowsheet Row Office Visit from 02/26/2022 in [place] Management from 10/06/2021 in [place] Visit from 08/25/2021 in [place] Management from 07/07/2021 in [place] Management from 12/16/2020 in [place] Management from 09/15/2020 in [place] Interventions        Transportation Interventions -- -- -- -- Intervention Not Indicated Intervention Not Indicated  Depression Interventions/Treatment  Currently on Treatment -- Currently on Treatment -- -- --  Financial Strain Interventions -- Intervention Not Indicated -- Intervention Not Indicated Intervention Not Indicated Intervention Not Indicated          CCM Care Plan  Allergies  Allergen Reactions   Pollen Extract Cough   Sulfa Antibiotics    Tetracycline     Headache   Tramadol     insomnia   Corticosteroids     Other reaction(s): Muscle Pain    Medications Reviewed Today     Reviewed by Randal Buba, CMA (Certified Medical Assistant) on  02/26/22 at 1438  Med List Status: <None>   Medication Order Taking? Sig Documenting Provider Last Dose Status Informant  albuterol (PROVENTIL) (2.5 MG/3ML) 0.083% nebulizer solution [id number] Yes Inhale into the lungs. [provider] Taking Active   albuterol (PROVENTIL) (2.5 MG/3ML) 0.083% nebulizer solution [id number] Yes SMARTSIG:3 Milliliter(s) Via Nebulizer Every 6 Hours PRN [provider] Taking Active   albuterol (VENTOLIN HFA) 108 (90 Base) MCG/ACT inhaler [id number] Yes Inhale into  the lungs. [provider] Taking Active   alprazolam Duanne Moron) 2 MG tablet [id number] Yes Take 1 tablet (2 mg total) by mouth 4 (four) times daily. Birdie Sons, MD Taking Active   Azelastine-Fluticasone 137-50 MCG/ACT Elizbeth Squires [id number] Yes Place 1 spray into both nostrils 2 (two) times daily. [provider] Taking Active            Med Note Josiah Lobo, Deveron Furlong   Mon Feb 16, 2016 12:04 PM) Received from: [place]: Place into the nose.  busPIRone (BUSPAR) 15 MG tablet [id number] Yes Take 1 tablet (15 mg total) by mouth 2 (two) times daily. Birdie Sons, MD Taking Active   busPIRone (BUSPAR) 15 MG tablet [id number] Yes take 1/2 tablet by mouth four times a day [provider] Taking Active   cefdinir (OMNICEF) 300 MG capsule [id number] Yes cefdinir 300 mg capsule [provider] Taking Active   cephALEXin (KEFLEX) 500 MG capsule [id number] Yes cephalexin 500 mg capsule [provider] Taking Active   cyclobenzaprine (FLEXERIL) 5 MG tablet [id number] Yes TAKE 1 TO 2 TABLETS(5 TO 10 MG) BY MOUTH THREE TIMES DAILY AS NEEDED FOR MUSCLE SPASMS [provider] Taking Active   diltiazem (CARDIZEM CD) 240 MG 24 hr capsule [id number] Yes Take 1 capsule by mouth daily. bedtime [provider] Taking Active            Med Note Josiah Lobo, ROSHENA L   Mon Feb 16, 2016 12:04 PM) Received from: [place]: Take by mouth.  diltiazem (TIAZAC) 240 MG 24 hr capsule [id number] Yes Take 1 tablet by mouth daily. [provider] Taking Active   fexofenadine (ALLEGRA) 180 MG tablet [id number] Yes Take by mouth. [provider] Taking Active   fluticasone-salmeterol (ADVAIR) 250-50 MCG/ACT AEPB [id number] Yes Inhale into the lungs. [provider] Taking Active   Fluticasone-Salmeterol (ADVAIR) 250-50 MCG/DOSE AEPB [id number] Yes Inhale 1 puff into the lungs 2 (two) times daily. [provider] Taking Active Self  furosemide (LASIX) 20 MG tablet [id number] Yes TAKE 1 TABLET(20 MG) BY MOUTH DAILY AS NEEDED FOR FLUID RETENTION Birdie Sons, MD Taking Active   hydrochlorothiazide (HYDRODIURIL) 25 MG tablet [id number] Yes TAKE 1 TABLET BY MOUTH EVERY DAY Birdie Sons, MD Taking Active   hydrochlorothiazide (HYDRODIURIL) 25 MG tablet [id number] Yes hydrochlorothiazide 25 mg tablet  TAKE 1 TABLET BY MOUTH DAILY [provider] Taking Active   HYDROcodone-acetaminophen (NORCO) 10-325 MG tablet [id number] Yes Take 1 tablet by mouth in the morning, at noon, and at bedtime.  [provider] Taking Active   ketoconazole (NIZORAL) 2 % cream [id number] Yes Apply 1 application topically daily. Birdie Sons, MD Taking Active   levalbuterol Penne Lash) 1.25 MG/3ML nebulizer solution [id number] Yes levalbuterol 1.25 mg/3 mL solution for nebulization  TAKE 3 MLS BY NEBULIZATION EVERY 6 HOURS AS NEEDED FOR WHEEZING [provider] Taking Active   losartan (COZAAR) 100 MG tablet [id number] Yes Take 100 mg by mouth daily. [provider] Taking Active   losartan (COZAAR) 100 MG tablet [id number] Yes Take 1 tablet by mouth daily. [provider] Taking Active   meloxicam (MOBIC) 15 MG tablet [id number] Yes TAKE 1 TABLET(15 MG) BY MOUTH DAILY Birdie Sons, MD Taking Active   meloxicam (MOBIC) 15 MG tablet [id number] Yes meloxicam 15  mg tablet  Take 1 tablet every day by oral route. [provider] Taking Active   methylPREDNISolone (MEDROL DOSEPAK)  4 MG TBPK tablet [id number] Yes See admin instructions. follow package directions [provider] Taking Active   montelukast (SINGULAIR) 10 MG tablet [id number] Yes Take 1 tablet by mouth daily. [provider] Taking Active            Med Note Josiah Lobo, Deveron Furlong   Mon Feb 16, 2016 12:04 PM) Received from: [place]:   Multiple Vitamins-Minerals (CENTRUM SILVER PO) [id number] Yes Take 1 tablet by mouth daily. [provider] Taking Active            Med Note Josiah Lobo, Deveron Furlong   Mon Feb 16, 2016 12:04 PM) Received from: [place]:   mupirocin ointment (BACTROBAN) 2 % [id number] Yes Apply 1 application topically daily. Virginia Crews, MD Taking Active   mupirocin ointment (BACTROBAN) 2 % [id number] Yes Apply topically. [provider] Taking Active   naloxone [facility]) nasal spray 4 mg/0.1 mL [id number] Yes Narcan 4 mg/actuation nasal spray  Take by nasal route every 3 minutes until patient awakes or EMS arrives. [provider] Taking Active   Niacin CR 1000 MG TBCR [id number] Yes  [provider] Taking Active   OXYGEN [id number] Yes Place 3 L into the nose. At all times [provider] Taking Active   potassium chloride SA (KLOR-CON) 20 MEQ tablet [id number] Yes TAKE 1 TABLET BY MOUTH EVERY DAY Fisher, Kirstie Peri, MD Taking Active   predniSONE (DELTASONE) 10 MG tablet [id number] Yes Prednisone 10 mg, 4 pills q day x 2 days, 3 pills q day x 2 days, 2 pills q day x 2 days, 1 pill q day x 2 days. [provider] Taking Active   PROAIR HFA [phone] Base) MCG/ACT inhaler [id number] Yes Inhale 1 puff into the lungs every 6 (six) hours as needed.  [provider] Taking Active            Med Note Sabra Heck, Emmaline Kluver M   Tue Feb 17, 2016  2:20  PM) Received from: External Pharmacy  rosuvastatin (CRESTOR) 20 MG tablet [id number] Yes TAKE 1 TABLET(20 MG) BY MOUTH DAILY Birdie Sons, MD Taking Active   simvastatin (ZOCOR) 20 MG tablet [id number] Yes  [provider] Taking Active   simvastatin (ZOCOR) 40 MG tablet [id number] Yes simvastatin 40 mg tablet [provider] Taking Active   tamsulosin (FLOMAX) 0.4 MG CAPS capsule [id number] Yes TAKE 1 CAPSULE(0.4 MG) BY MOUTH DAILY Stoioff, Scott C, MD Taking Active   triamcinolone (NASACORT) 55 MCG/ACT AERO nasal inhaler [id number] Yes Place into the nose. [provider] Taking Active   triamcinolone cream (KENALOG) 0.1 % [id number] Yes Apply 1 application topically 2 (two) times daily. Birdie Sons, MD Taking Active   valACYclovir (VALTREX) 1000 MG tablet [id number] Yes valacyclovir 1 gram tablet  TK 1 T PO TID [provider] Taking Active   venlafaxine XR (EFFEXOR-XR) 75 MG 24 hr capsule [id number] Yes TAKE 3 CAPSULES(225 MG) BY MOUTH EVERY MORNING Fisher, Kirstie Peri, MD Taking Active   venlafaxine XR (EFFEXOR-XR) 75 MG 24 hr capsule [id number] Yes Take 3 capsules by mouth every morning. [provider] Taking Active             Patient Active Problem List   Diagnosis Date Noted   Chronic kidney disease, stage 3 unspecified ([place]) 02/27/2022   Lumbar radiculopathy 10/31/2021   Chronic respiratory failure with hypoxia ([place]) 02/17/2021   ILD (interstitial lung disease) ([place]) 12/25/2019  Osteoarthritis of left knee 05/16/2019   Osteoarthritis of right knee 05/16/2019   Pain in right knee 08/18/2018   Lumbar spondylosis 07/20/2018   Degeneration of lumbar intervertebral disc 03/23/2018   Aortic atherosclerosis ([place]) 10/14/2017   Elevated PSA 04/01/2016   Morbid obesity, unspecified obesity type ([place]) 02/16/2016   Dermatitis, stasis 02/06/2015   Bilateral leg edema 02/06/2015   Chronic venous stasis dermatitis of both lower extremities  02/06/2015   Prediabetes 04/30/2014   Hyperlipidemia, mixed 09/05/2008   Hypertension 02/13/2008   Anxiety disorder 10/25/1998   Asthma, intrinsic 10/25/1968    Immunization History  Administered Date(s) Administered   H1N1 08/29/2008   Influenza Split 10/03/2014   Influenza,inj,Quad PF,6+ Mos 08/19/2020, 08/25/2021   Influenza-Unspecified 09/07/2017, 07/25/2018, 07/31/2019   PFIZER Comirnaty(Gray Top)Covid-19 Tri-Sucrose Vaccine 01/10/2020, 01/31/2020   PFIZER(Purple Top)SARS-COV-2 Vaccination 12/17/2020   PNEUMOCOCCAL CONJUGATE-20 02/26/2022   Tdap 02/17/2016    Conditions to be addressed/monitored:  Hypertension, Hyperlipidemia, Asthma, Anxiety, Osteoarthritis and Prediabetes  There are no care plans that you recently modified to display for this patient.   Medication Assistance: None required.  Patient affirms current coverage meets needs.  Patient's preferred pharmacy is:  [facility] [place], [place] - [address] [place] [address] Phone: [phone] Fax: [phone]  Uses pill box? Yes Pt endorses 100% compliance  We discussed: Current pharmacy is preferred with insurance plan and patient is satisfied with pharmacy services Patient decided to: Continue current medication management strategy  Care Plan and Follow Up Patient Decision:  Patient agrees to Care Plan and Follow-up.  Plan: Telephone follow up appointment with care management team member scheduled for:  08/03/2022 at 3:00 PM  Junius Argyle, PharmD, Para [place], CPP  Clinical Pharmacist Practitioner  [facility] [phone]  Patient Care Plan: General Pharmacy (Adult)     Problem Identified: Hypertension, Hyperlipidemia, Asthma, Anxiety, Osteoarthritis and Prediabetes   Priority: High     Long-Range Goal: Patient-Specific Goal   Start Date: 12/17/2020  Expected End Date: 05/22/2023  This Visit's  Progress: On track  Recent Progress: On track  Priority: High  Note:       Current Barriers:  Unable to independently monitor therapeutic efficacy  Pharmacist Clinical Goal(s):  Over the next 90 days, patient will maintain control of blood pressure  as evidenced by BP less than 130/80  through collaboration with PharmD and provider.   Interventions: 1:1 collaboration with Birdie Sons, MD regarding development and update of comprehensive plan of care as evidenced by provider attestation and co-signature Inter-disciplinary care team collaboration (see longitudinal plan of care) Comprehensive medication review performed; medication list updated in electronic medical record  Hypertension (BP goal <130/80) -Controlled -Current treatment: Diltiazem CD 120 mg daily  Furosemide 20 mg daily  HCTZ 25 mg daily  Losartan 100 mg daily  -Medications previously tried: NA  -Current home readings: NA,  -Current dietary habits: Does try to limit salt. -Current exercise habits: Limited by osteoarthritis pain.  -Denies hypotensive/hypertensive symptoms -Recommended to continue current medication  Hyperlipidemia: (LDL goal < 70) -Controlled -Current treatment: Rosuvastatin 20 mg nightly  -Medications previously tried: NA  -Educated on Benefits of statin for ASCVD risk reduction; -Tolerating rosuvastatin well, saw marked improvement in cholesterol reduction.  Continue current medications  Asthma/ Interstitial Lung Disease/ Chronic Respiratory Failure (Goal: control symptoms and prevent exacerbations) -Managed by Dr. Raul Del  -controlled -Current treatment  Dymista 137-60 mcg/act 1 spray  twice daily  Flonase nasal spray  Advair 250-50 mcg/act 1 puff twice daily  Monteluakst 10 mg nightly  ProAir HFA  Oxygen 3L -Medications previously tried: NA  -Exacerbations requiring treatment in last 6 months: None -Patient reports consistent use of maintenance inhaler -Frequency of rescue  inhaler use: infrequently -Continue current medications  Chronic Pain (Goal: Provide adequate pain relief) -Controlled  -Managed by Levy Pupa, PA-C and Dr. Nelva Bush -Receives knee injections every 6 months  -Current treatment   Hydrocodone-APAP 10-325 mg three times daily  Meloxicam 15 mg daily  -Medications previously tried: NA   -Recommended to continue current medication  Depression/Anxiety (Goal: Maintain stable mood) -Controlled -Current treatment: Alprazolam 2 mg four times daily  Buspirone 15 mg 1/2 tablet three times daily  Venlafaxine XR 75 mg 3 capsules daily  -Medications previously tried/failed: NA -PHQ9: 1 -GAD7: NA -Educated on Benefits of medication for symptom control -Recommended to continue current medication  Patient Goals/Self-Care Activities Over the next 90 days, patient will:  - check blood pressure weekly, document, and provide at future appointments  Follow Up Plan: ***

## 2022-08-04 ENCOUNTER — Other Ambulatory Visit: Payer: Self-pay | Admitting: Family Medicine

## 2022-08-12 DIAGNOSIS — J4540 Moderate persistent asthma, uncomplicated: Secondary | ICD-10-CM | POA: Diagnosis not present

## 2022-08-12 DIAGNOSIS — J4489 Other specified chronic obstructive pulmonary disease: Secondary | ICD-10-CM | POA: Diagnosis not present

## 2022-08-12 DIAGNOSIS — R051 Acute cough: Secondary | ICD-10-CM | POA: Diagnosis not present

## 2022-09-01 ENCOUNTER — Other Ambulatory Visit: Payer: Self-pay | Admitting: Family Medicine

## 2022-09-07 DIAGNOSIS — J4530 Mild persistent asthma, uncomplicated: Secondary | ICD-10-CM | POA: Diagnosis not present

## 2022-09-19 ENCOUNTER — Other Ambulatory Visit: Payer: Self-pay | Admitting: Family Medicine

## 2022-09-19 DIAGNOSIS — F419 Anxiety disorder, unspecified: Secondary | ICD-10-CM

## 2022-10-01 ENCOUNTER — Other Ambulatory Visit: Payer: Self-pay | Admitting: Family Medicine

## 2022-10-01 DIAGNOSIS — F419 Anxiety disorder, unspecified: Secondary | ICD-10-CM

## 2022-10-01 NOTE — Telephone Encounter (Signed)
Unable to refill per protocol, medication request is too soon. Last refill 10/27/21 for 180 and 3 refills. Will refuse.  Requested Prescriptions  Pending Prescriptions Disp Refills   busPIRone (BUSPAR) 15 MG tablet [Pharmacy Med Name: BUSPIRONE 15MG  TABLETS] 180 tablet 3    Sig: TAKE 1 TABLET(15 MG) BY MOUTH TWICE DAILY     Psychiatry: Anxiolytics/Hypnotics - Non-controlled Failed - 10/01/2022  6:40 AM      Failed - Valid encounter within last 12 months    Recent Outpatient Visits           7 months ago Annual physical exam   [facility], [place]   1 year ago Primary hypertension   [facility], MD   1 year ago Hyperglycemia   [facility], MD   2 years ago Hyperglycemia   [facility], MD   2 years ago Cellulitis of left lower extremity   [facility], [place], [place]

## 2022-10-28 ENCOUNTER — Telehealth: Payer: Self-pay

## 2022-10-28 NOTE — Progress Notes (Signed)
Care Management & Coordination Services Pharmacy Team  Reason for Encounter: Hypertension   Recent office visits:  None ID  Recent consult visits:  09/07/2022 Wallene Huh MD (Pulmonary Disease) No Medication Changes noted  08/12/2022 Wallene Huh MD (Pulmonary Disease) Unable to see note  Hospital visits:  None in previous 6 months  Medications: Outpatient Encounter Medications as of 10/28/2022  Medication Sig Note   albuterol (PROVENTIL) (2.5 MG/3ML) 0.083% nebulizer solution SMARTSIG:3 Milliliter(s) Via Nebulizer Every 6 Hours PRN    albuterol (VENTOLIN HFA) 108 (90 Base) MCG/ACT inhaler Inhale into the lungs.    alprazolam (XANAX) 2 MG tablet TAKE 1 TABLET BY MOUTH FOUR TIMES DAILY    Azelastine-Fluticasone 137-50 MCG/ACT SUSP Place 1 spray into both nostrils 2 (two) times daily. 02/16/2016: Received from: [place]: Place into the nose.   busPIRone (BUSPAR) 15 MG tablet Take 1 tablet (15 mg total) by mouth 2 (two) times daily. (Patient taking differently: Take 7.5 mg by mouth in the morning, at noon, in the evening, and at bedtime.)    diltiazem (TIAZAC) 120 MG 24 hr capsule Take 1 capsule (120 mg total) by mouth daily.    fexofenadine (ALLEGRA) 180 MG tablet Take by mouth.    fluticasone-salmeterol (ADVAIR) 250-50 MCG/ACT AEPB Inhale 1 puff into the lungs in the morning and at bedtime.    furosemide (LASIX) 20 MG tablet TAKE 1 TABLET(20 MG) BY MOUTH DAILY AS NEEDED FOR FLUID RETENTION    hydrochlorothiazide (HYDRODIURIL) 25 MG tablet Take 1 tablet (25 mg total) by mouth daily.    HYDROcodone-acetaminophen (NORCO) 10-325 MG tablet Take 1 tablet by mouth in the morning, at noon, and at bedtime.     ketoconazole (NIZORAL) 2 % cream Apply 1 application topically daily.    levalbuterol (XOPENEX) 1.25 MG/3ML nebulizer solution levalbuterol 1.25 mg/3 mL solution for nebulization  TAKE 3 MLS BY NEBULIZATION EVERY 6 HOURS AS NEEDED FOR WHEEZING     losartan (COZAAR) 100 MG tablet Take 1 tablet by mouth daily.    meloxicam (MOBIC) 15 MG tablet TAKE 1 TABLET(15 MG) BY MOUTH DAILY    montelukast (SINGULAIR) 10 MG tablet Take 1 tablet by mouth daily. 02/16/2016: Received from: [place]:    Multiple Vitamins-Minerals (CENTRUM SILVER PO) Take 1 tablet by mouth daily. 02/16/2016: Received from: [place]:    mupirocin ointment (BACTROBAN) 2 % Apply 1 application topically daily.    naloxone (NARCAN) nasal spray 4 mg/0.1 mL Narcan 4 mg/actuation nasal spray  Take by nasal route every 3 minutes until patient awakes or EMS arrives.    OXYGEN Place 3 L into the nose. At all times    PROAIR HFA 108 (90 Base) MCG/ACT inhaler Inhale 1 puff into the lungs every 6 (six) hours as needed.  02/17/2016: Received from: External Pharmacy   rosuvastatin (CRESTOR) 20 MG tablet Take 1 tablet (20 mg total) by mouth daily.    tamsulosin (FLOMAX) 0.4 MG CAPS capsule TAKE 1 CAPSULE(0.4 MG) BY MOUTH DAILY    triamcinolone cream (KENALOG) 0.1 % Apply 1 application topically 2 (two) times daily.    valACYclovir (VALTREX) 1000 MG tablet valacyclovir 1 gram tablet  TK 1 T PO TID    venlafaxine XR (EFFEXOR-XR) 75 MG 24 hr capsule TAKE 3 CAPSULES(225 MG) BY MOUTH EVERY MORNING    No facility-administered encounter medications on file as of 10/28/2022.    Recent Office Vitals: BP Readings from Last 3 Encounters:  02/26/22 105/60  08/25/21 114/68  07/13/21 103/63   Pulse Readings from Last 3 Encounters:  02/26/22 97  08/25/21 93  07/13/21 89    Wt Readings from Last 3 Encounters:  02/26/22 230 lb (104.3 kg)  07/13/21 280 lb (127 kg)  04/20/21 270 lb (122.5 kg)     Kidney Function Lab Results  Component Value Date/Time   CREATININE 1.80 (H) 02/26/2022 04:06 PM   CREATININE 1.30 (H) 08/25/2021 03:40 PM   CREATININE 1.05 10/13/2017 02:47 PM   GFRNONAA 69 02/20/2020 10:37 AM   GFRNONAA 77  10/13/2017 02:47 PM   GFRAA 79 02/20/2020 10:37 AM   GFRAA 89 10/13/2017 02:47 PM       Latest Ref Rng & Units 02/26/2022    4:06 PM 08/25/2021    3:40 PM 02/17/2021    4:18 PM  BMP  Glucose 70 - 99 mg/dL 93  125  101   BUN 8 - 27 mg/dL 21  24  14    Creatinine 0.76 - 1.27 mg/dL 1.80  1.30  1.39   BUN/Creat Ratio 10 - 24 12  18  10    Sodium 134 - 144 mmol/L 136  141  143   Potassium 3.5 - 5.2 mmol/L 3.4  3.7  4.6   Chloride 96 - 106 mmol/L 90  98  96   CO2 20 - 29 mmol/L 29  30  30    Calcium 8.6 - 10.2 mg/dL 10.1  9.2  9.7      Current antihypertensive regimen:  Diltiazem CD 120 mg daily  Furosemide 20 mg daily  HCTZ 25 mg daily  Losartan 100 mg daily   I have attempted without success to contact this patient by phone three times to do his hypertension Disease State call. I left a Voice message for patient to return my call.   What recent interventions/DTPs have been made by any provider to improve Blood Pressure control since last CPP Visit: None ID  Any recent hospitalizations or ED visits since last visit with CPP? No   Adherence Review: Is the patient currently on ACE/ARB medication? Yes Does the patient have >5 day gap between last estimated fill dates? No   Care Gaps: COVID-19 Vaccine HIV Screening Shingrix Vaccine   Star Rating Drugs: Rosuvastatin 20 mg last filled 08/09/2022 90 day supply at [facility] Losartan 100 mg last filled 10/05/2022 90 day supply at [facility].   [place] Pharmacist Assistant [phone]

## 2022-10-31 ENCOUNTER — Other Ambulatory Visit: Payer: Self-pay | Admitting: Family Medicine

## 2022-11-30 ENCOUNTER — Other Ambulatory Visit: Payer: Self-pay | Admitting: Family Medicine

## 2022-12-03 ENCOUNTER — Other Ambulatory Visit: Payer: Self-pay | Admitting: Family Medicine

## 2022-12-03 DIAGNOSIS — F419 Anxiety disorder, unspecified: Secondary | ICD-10-CM

## 2022-12-03 NOTE — Telephone Encounter (Signed)
Patient will need an office visit for further refills. Requested Prescriptions  Pending Prescriptions Disp Refills   busPIRone (BUSPAR) 15 MG tablet [Pharmacy Med Name: BUSPIRONE 15MG TABLETS] 180 tablet 0    Sig: TAKE 1 TABLET(15 MG) BY MOUTH TWICE DAILY     Psychiatry: Anxiolytics/Hypnotics - Non-controlled Failed - 12/03/2022  3:04 PM      Failed - Valid encounter within last 12 months    Recent Outpatient Visits           9 months ago Annual physical exam   [facility] Birdie Sons, MD   1 year ago Primary hypertension   [place] Birdie Sons, MD   1 year ago Hyperglycemia   [place], Donald E, MD   2 years ago Hyperglycemia   [place], Donald E, MD   2 years ago Cellulitis of left lower extremity   [place], Fabio [place], [place]

## 2022-12-18 ENCOUNTER — Other Ambulatory Visit: Payer: Self-pay | Admitting: Urology

## 2022-12-27 ENCOUNTER — Other Ambulatory Visit: Payer: Self-pay | Admitting: Family Medicine

## 2022-12-28 NOTE — Telephone Encounter (Signed)
Requested medication (s) are due for refill today: yes  Requested medication (s) are on the active medication list: yes  Last refill:  12/01/22  Future visit scheduled: no  Notes to clinic:  Unable to refill per protocol, courtesy refill already given, routing for provider approval.      Requested Prescriptions  Pending Prescriptions Disp Refills   venlafaxine XR (EFFEXOR-XR) 75 MG 24 hr capsule [Pharmacy Med Name: VENLAFAXINE ER '75MG'$  CAPSULES] 90 capsule 1    Sig: TAKE 3 CAPSULES(225 MG) BY MOUTH EVERY MORNING     Psychiatry: Antidepressants - SNRI - desvenlafaxine & venlafaxine Failed - 12/27/2022  6:35 AM      Failed - Cr in normal range and within 360 days    Creat  Date Value Ref Range Status  10/13/2017 1.05 0.70 - 1.25 mg/dL Final    Comment:    For patients >36 years of age, the reference limit for Creatinine is approximately 13% higher for people identified as African-American. .    Creatinine, Ser  Date Value Ref Range Status  02/26/2022 1.80 (H) 0.76 - 1.27 mg/dL Final         Failed - Valid encounter within last 6 months    Recent Outpatient Visits           10 months ago Annual physical exam   [place] Birdie Sons, MD   1 year ago Primary hypertension   [place], Donald E, MD   1 year ago Hyperglycemia   [place], Donald E, MD   2 years ago Hyperglycemia   [facility] Birdie Sons, MD   2 years ago Cellulitis of left lower extremity   [place], [place] M, [place]              Failed - Lipid Panel in normal range within the last 12 months    Cholesterol, Total  Date Value Ref Range Status  02/26/2022 225 (H) 100 - 199 mg/dL Final   LDL Cholesterol (Calc)  Date Value Ref Range Status  10/13/2017 83 mg/dL (calc) Final    Comment:    Reference range: <100 . Desirable range  <100 mg/dL for primary prevention;   <70 mg/dL for patients with CHD or diabetic patients  with > or = 2 CHD risk factors. Marland Kitchen LDL-C is now calculated using the Martin-Hopkins  calculation, which is a validated novel method providing  better accuracy than the Friedewald equation in the  estimation of LDL-C.  Cresenciano Genre et al. Annamaria Helling. [id number]): 2061-2068  (http://education.[facility].com/faq/FAQ164)    LDL Chol Calc (NIH)  Date Value Ref Range Status  02/26/2022 139 (H) 0 - 99 mg/dL Final   HDL  Date Value Ref Range Status  02/26/2022 34 (L) >39 mg/dL Final   Triglycerides  Date Value Ref Range Status  02/26/2022 285 (H) 0 - 149 mg/dL Final         Passed - Last BP in normal range    BP Readings from Last 1 Encounters:  02/26/22 105/60          furosemide (LASIX) 20 MG tablet [Pharmacy Med Name: FUROSEMIDE '20MG'$  TABLETS] 90 tablet     Sig: TAKE 1 TABLET(20 MG) BY MOUTH DAILY AS NEEDED FOR FLUID RETENTION     Cardiovascular:  Diuretics - Loop Failed - 12/27/2022  6:35 AM      Failed - K in  normal range and within 180 days    Potassium  Date Value Ref Range Status  02/26/2022 3.4 (L) 3.5 - 5.2 mmol/L Final         Failed - Ca in normal range and within 180 days    Calcium  Date Value Ref Range Status  02/26/2022 10.1 8.6 - 10.2 mg/dL Final         Failed - Na in normal range and within 180 days    Sodium  Date Value Ref Range Status  02/26/2022 136 134 - 144 mmol/L Final         Failed - Cr in normal range and within 180 days    Creat  Date Value Ref Range Status  10/13/2017 1.05 0.70 - 1.25 mg/dL Final    Comment:    For patients >59 years of age, the reference limit for Creatinine is approximately 13% higher for people identified as African-American. .    Creatinine, Ser  Date Value Ref Range Status  02/26/2022 1.80 (H) 0.76 - 1.27 mg/dL Final         Failed - Cl in normal range and within 180 days    Chloride  Date Value Ref Range Status   02/26/2022 90 (L) 96 - 106 mmol/L Final         Failed - Mg Level in normal range and within 180 days    No results found for: "MG"       Failed - Valid encounter within last 6 months    Recent Outpatient Visits           10 months ago Annual physical exam   [place] Birdie Sons, MD   1 year ago Primary hypertension   [place], Donald E, MD   1 year ago Hyperglycemia   [place], Donald E, MD   2 years ago Hyperglycemia   [place], Donald E, MD   2 years ago Cellulitis of left lower extremity   [place] Carles Collet M, [place]              Passed - Last BP in normal range    BP Readings from Last 1 Encounters:  02/26/22 105/60

## 2022-12-30 ENCOUNTER — Telehealth: Payer: Self-pay | Admitting: Family Medicine

## 2022-12-30 NOTE — Telephone Encounter (Signed)
Called patient to schedule Medicare Annual Wellness Visit (AWV). No voicemail available to leave a message.  Last date of AWV: 02/27/2023  Please schedule an appointment at any time with [facility] .  If any questions, please contact me at [phone].  Thank you ,  [place] Direct Dial: [phone]

## 2023-01-08 ENCOUNTER — Other Ambulatory Visit: Payer: Self-pay | Admitting: Urology

## 2023-01-17 ENCOUNTER — Other Ambulatory Visit: Payer: Self-pay | Admitting: Family Medicine

## 2023-01-17 DIAGNOSIS — F419 Anxiety disorder, unspecified: Secondary | ICD-10-CM

## 2023-01-17 NOTE — Telephone Encounter (Signed)
Medication Refill - Medication: alprazolam Duanne Moron) 2 MG tablet   Has the patient contacted their pharmacy? yes (Agent: If no, request that the patient contact the pharmacy for the refill. If patient does not wish to contact the pharmacy document the reason why and proceed with request.) (Agent: If yes, when and what did the pharmacy advise?)couldn't get thru to a person   Preferred Pharmacy (with phone number or street name):  [facility] [place], [place] - [address] Phone: [phone]  Fax: [phone]     Has the patient been seen for an appointment in the last year OR does the patient have an upcoming appointment? yes  Agent: Please be advised that RX refills may take up to 3 business days. We ask that you follow-up with your pharmacy.

## 2023-01-18 ENCOUNTER — Ambulatory Visit: Payer: Self-pay | Admitting: *Deleted

## 2023-01-18 ENCOUNTER — Other Ambulatory Visit: Payer: Self-pay | Admitting: Family Medicine

## 2023-01-18 DIAGNOSIS — F419 Anxiety disorder, unspecified: Secondary | ICD-10-CM

## 2023-01-18 NOTE — Telephone Encounter (Signed)
Pt called saying it is urgent that he gets the xanax asap.  He says his anxiety is really bad

## 2023-01-18 NOTE — Telephone Encounter (Signed)
Requested medication (s) are due for refill today - yes  Requested medication (s) are on the active medication list -yes  Future visit scheduled -no  Last refill: 09/19/22 #120 3RF  Notes to clinic: non delegated Rx  Requested Prescriptions  Pending Prescriptions Disp Refills   alprazolam (XANAX) 2 MG tablet [Pharmacy Med Name: ALPRAZOLAM 2MG  TABLETS] 120 tablet     Sig: TAKE 1 TABLET BY MOUTH FOUR TIMES DAILY     Not Delegated - Psychiatry: Anxiolytics/Hypnotics 2 Failed - 01/18/2023 11:50 AM      Failed - This refill cannot be delegated      Failed - Urine Drug Screen completed in last 360 days      Failed - Valid encounter within last 6 months    Recent Outpatient Visits           10 months ago Annual physical exam   [place], Donald E, MD   1 year ago Primary hypertension   [place], Donald E, MD   1 year ago Hyperglycemia   [place], Donald E, MD   2 years ago Hyperglycemia   [place], Donald E, MD   2 years ago Cellulitis of left lower extremity   [place], [place], [place]              Passed - Patient is not pregnant         Requested Prescriptions  Pending Prescriptions Disp Refills   alprazolam Duanne Moron) 2 MG tablet [Pharmacy Med Name: ALPRAZOLAM 2MG  TABLETS] 120 tablet     Sig: TAKE 1 TABLET BY MOUTH FOUR TIMES DAILY     Not Delegated - Psychiatry: Anxiolytics/Hypnotics 2 Failed - 01/18/2023 11:50 AM      Failed - This refill cannot be delegated      Failed - Urine Drug Screen completed in last 360 days      Failed - Valid encounter within last 6 months    Recent Outpatient Visits           10 months ago Annual physical exam   [place], Donald E, MD   1 year ago Primary hypertension   [place], Donald E, MD   1 year ago Hyperglycemia   [place] Birdie Sons, MD   2 years ago Hyperglycemia   [place], Donald E, MD   2 years ago Cellulitis of left lower extremity   [place], [place], [place]              Passed - Patient is not pregnant

## 2023-01-18 NOTE — Telephone Encounter (Signed)
Requested medication (s) are due for refill today - yes  Requested medication (s) are on the active medication list -yes  Future visit scheduled -no  Last refill: 09/19/22 #120 3RF  Notes to clinic: non delegated Rx  Requested Prescriptions  Pending Prescriptions Disp Refills   alprazolam (XANAX) 2 MG tablet 120 tablet 3    Sig: Take 1 tablet (2 mg total) by mouth 4 (four) times daily.     Not Delegated - Psychiatry: Anxiolytics/Hypnotics 2 Failed - 01/17/2023 11:25 AM      Failed - This refill cannot be delegated      Failed - Urine Drug Screen completed in last 360 days      Failed - Valid encounter within last 6 months    Recent Outpatient Visits           10 months ago Annual physical exam   [place], Donald E, MD   1 year ago Primary hypertension   [place], Donald E, MD   1 year ago Hyperglycemia   [place], Donald E, MD   2 years ago Hyperglycemia   [facility] Birdie Sons, MD   2 years ago Cellulitis of left lower extremity   [place], [place], [place]              Passed - Patient is not pregnant         Requested Prescriptions  Pending Prescriptions Disp Refills   alprazolam (XANAX) 2 MG tablet 120 tablet 3    Sig: Take 1 tablet (2 mg total) by mouth 4 (four) times daily.     Not Delegated - Psychiatry: Anxiolytics/Hypnotics 2 Failed - 01/17/2023 11:25 AM      Failed - This refill cannot be delegated      Failed - Urine Drug Screen completed in last 360 days      Failed - Valid encounter within last 6 months    Recent Outpatient Visits           10 months ago Annual physical exam   [place] Birdie Sons, MD   1 year ago Primary hypertension   [place], Donald E, MD   1 year ago Hyperglycemia    [place], Donald E, MD   2 years ago Hyperglycemia   [place], Donald E, MD   2 years ago Cellulitis of left lower extremity   [place] Carles Collet M, [place]              Passed - Patient is not pregnant

## 2023-01-18 NOTE — Telephone Encounter (Signed)
  Chief Complaint: possible withdrawal sx from xanax , requesting refill  Symptoms: last time xanax taken by patient yesterday. Today sx anxiety, crying, hands shaking, balance off, word confusion. Remains on O 2 @ 3 L/min [place].  Frequency: today  Pertinent Negatives: Patient denies chest pain no difficulty breathing no sweating  no N/V reported  Disposition: [] ED /[] Urgent Care (no appt availability in office) / [] Appointment(In office/virtual)/ []  [facility]/ [] Home Care/ [] Refused Recommended Disposition /[] [facility]/ [x]  Follow-up with PCP Additional Notes:   Requests have been sent for refill of xanax. Advise patient is sx worsen go to ED. Patient requesting assistance with getting medication refilled . Has transportation issues and his neighbor is willing to take him to pharmacy today if Rx can be sent in. Previous # listed in chart have been disconnected. Neighbor's # [phone] not listed is only # patient can be reached if needed until new phone is bought. Please advise.      Reason for Disposition  [1] Caller has URGENT medicine question about med that PCP or specialist prescribed AND [2] triager unable to answer question  Answer Assessment - Initial Assessment Questions 1. NAME of MEDICINE: "What medicine(s) are you calling about?"     Xanax  2. QUESTION: "What is your question?" (e.g., double dose of medicine, side effect)     Can medication be refilled and sent to pharmacy for refill? 3. PRESCRIBER: "Who prescribed the medicine?" Reason: if prescribed by specialist, call should be referred to that group.     PCP 4. SYMPTOMS: "Do you have any symptoms?" If Yes, ask: "What symptoms are you having?"  "How bad are the symptoms (e.g., mild, moderate, severe)     Yes , anxiety, crying, hands shaking, balance off word confusion.  5. PREGNANCY:  "Is there any chance that you are pregnant?" "When was your last menstrual period?"     na  Protocols used:  Medication Question Call-A-AH

## 2023-02-10 ENCOUNTER — Other Ambulatory Visit: Payer: Self-pay | Admitting: Family Medicine

## 2023-03-07 ENCOUNTER — Other Ambulatory Visit: Payer: Self-pay | Admitting: Family Medicine

## 2023-03-07 DIAGNOSIS — F419 Anxiety disorder, unspecified: Secondary | ICD-10-CM

## 2023-03-16 ENCOUNTER — Other Ambulatory Visit: Payer: Self-pay | Admitting: Family Medicine

## 2023-03-16 NOTE — Telephone Encounter (Signed)
Patient is overdue for follow up appointment. Tried calling patient. Left message to call back. Ok for [facility] to advise and schedule appointment.

## 2023-03-16 NOTE — Telephone Encounter (Signed)
Requested medication (s) are due for refill today: Tiadylt due   Requested medication (s) are on the active medication list: yes    Last refill:  Tiadylt  02/26/22  #90  3 refills   Venlafaxine 12/30/22  #90  3 refills  Future visit scheduled no  Notes to clinic: Both meds failed due to labs, please review. Thank you.  Requested Prescriptions  Pending Prescriptions Disp Refills   TIADYLT ER 120 MG 24 hr capsule [Pharmacy Med Name: TIADYLT ER 120MG  CAPSULES (24 HR)] 90 capsule 3    Sig: TAKE 1 CAPSULE(120 MG) BY MOUTH DAILY     Cardiovascular: Calcium Channel Blockers 3 Failed - 03/16/2023 12:02 PM      Failed - ALT in normal range and within 360 days    ALT  Date Value Ref Range Status  02/26/2022 26 0 - 44 IU/L Final         Failed - AST in normal range and within 360 days    AST  Date Value Ref Range Status  02/26/2022 19 0 - 40 IU/L Final         Failed - Cr in normal range and within 360 days    Creat  Date Value Ref Range Status  10/13/2017 1.05 0.70 - 1.25 mg/dL Final    Comment:    For patients >39 years of age, the reference limit for Creatinine is approximately 13% higher for people identified as African-American. .    Creatinine, Ser  Date Value Ref Range Status  02/26/2022 1.80 (H) 0.76 - 1.27 mg/dL Final         Failed - Valid encounter within last 6 months    Recent Outpatient Visits           1 year ago Annual physical exam   [facility] Malva Limes, [facility]   1 year ago Primary hypertension   [place] [facility] Malva Limes, MD   2 years ago Hyperglycemia    [facility]   2 years ago Hyperglycemia   [facility]va Limes, MD   2 years ago Cellulitis of left lower extremity    [facility] Jodi Marble, Adriana M, [place]              Passed - Last BP in normal range    BP Readings from Last 1  Encounters:  02/26/22 105/60         Passed - Last Heart Rate in normal range    Pulse Readings from Last 1 Encounters:  02/26/22 97          venlafaxine XR (EFFEXOR-XR) 75 MG 24 hr capsule [Pharmacy Med Name: VENLAFAXINE ER 75MG  CAPSULES] 90 capsule 1    Sig: TAKE 3 CAPSULES(225 MG) BY MOUTH EVERY MORNING     Psychiatry: Antidepressants - SNRI - desvenlafaxine & venlafaxine Failed - 03/16/2023 12:02 PM      Failed - Cr in normal range and within 360 days    Creat  Date Value Ref Range Status  10/13/2017 1.05 0.70 - 1.25 mg/dL Final    Comment:    For patients >53 years of age, the reference limit for Creatinine is approximately 13% higher for people identified as African-American. .    Creatinine, Ser  Date Value Ref Range Status  02/26/2022 1.80 (H) 0.76 - 1.27 mg/dL Final         Failed -  Valid encounter within last 6 months    Recent Outpatient Visits           1 year ago Annual physical exam   [place] [facility] Malva Limes, MD   1 year ago Primary hypertension   [place] [facility] Malva Limes, MD   2 years ago Hyperglycemia   [facility] Malva Limes, MD   2 years ago Hyperglycemia   [facility] Malva Limes, MD   2 years ago Cellulitis of left lower extremity   [facility] [place], [place] M, [place]              Failed - Lipid Panel in normal range within the last 12 months    Cholesterol, Total  Date Value Ref Range Status  02/26/2022 225 (H) 100 - 199 mg/dL Final   LDL Cholesterol (Calc)  Date Value Ref Range Status  10/13/2017 83 mg/dL (calc) Final    Comment:    Reference range: <100 . Desirable range <100 mg/dL for primary prevention;   <70 mg/dL for patients with CHD or diabetic patients  with > or = 2 CHD risk factors. Marland Kitchen LDL-C is now calculated using the Martin-Hopkins  calculation, which is a validated  novel method providing  better accuracy than the Friedewald equation in the  estimation of LDL-C.  Horald Pollen et al. Lenox Ahr. 1610;960(45): 2061-2068  (http://education.[facility].com/faq/FAQ164)    LDL Chol Calc (NIH)  Date Value Ref Range Status  02/26/2022 139 (H) 0 - 99 mg/dL Final   HDL  Date Value Ref Range Status  02/26/2022 34 (L) >39 mg/dL Final   Triglycerides  Date Value Ref Range Status  02/26/2022 285 (H) 0 - 149 mg/dL Final         Passed - Last BP in normal range    BP Readings from Last 1 Encounters:  02/26/22 105/60

## 2023-04-08 ENCOUNTER — Other Ambulatory Visit: Payer: Self-pay | Admitting: Family Medicine

## 2023-04-08 DIAGNOSIS — F419 Anxiety disorder, unspecified: Secondary | ICD-10-CM

## 2023-04-08 NOTE — Telephone Encounter (Signed)
Requested medication (s) are due for refill today - no  Requested medication (s) are on the active medication list -yes  Future visit scheduled -no  Last refill: 04/08/23  Notes to clinic: non delegated Rx, duplicate request  Requested Prescriptions  Pending Prescriptions Disp Refills   alprazolam (XANAX) 2 MG tablet 120 tablet 3    Sig: Take 1 tablet (2 mg total) by mouth 4 (four) times daily.     Not Delegated - Psychiatry: Anxiolytics/Hypnotics 2 Failed - 04/08/2023  4:05 PM      Failed - This refill cannot be delegated      Failed - Urine Drug Screen completed in last 360 days      Failed - Valid encounter within last 6 months    Recent Outpatient Visits           1 year ago Annual physical exam   [place] [facility] Malva Limes, MD   1 year ago Primary hypertension   [place] [facility] Malva Limes, MD   2 years ago Hyperglycemia   [facility] Malva Limes, MD   2 years ago Hyperglycemia   [facility] Malva Limes, MD   2 years ago Cellulitis of left lower extremity   [facility] Osvaldo Angst M, [place]              Passed - Patient is not pregnant         Requested Prescriptions  Pending Prescriptions Disp Refills   alprazolam (XANAX) 2 MG tablet 120 tablet 3    Sig: Take 1 tablet (2 mg total) by mouth 4 (four) times daily.     Not Delegated - Psychiatry: Anxiolytics/Hypnotics 2 Failed - 04/08/2023  4:05 PM      Failed - This refill cannot be delegated      Failed - Urine Drug Screen completed in last 360 days      Failed - Valid encounter within last 6 months    Recent Outpatient Visits           1 year ago Annual physical exam   [place] [facility] Malva Limes, MD   1 year ago Primary hypertension   [facility] Malva Limes, MD   2 years ago Hyperglycemia    [facility] Malva Limes, MD   2 years ago Hyperglycemia   [facility] Malva Limes, MD   2 years ago Cellulitis of left lower extremity   [facility] Osvaldo Angst M, [place]              Passed - Patient is not pregnant

## 2023-04-08 NOTE — Telephone Encounter (Signed)
Medication Refill - Medication: alprazolam (XANAX) 2 MG tablet [[id number]]   Patient reports that he has 3 total pills left  Has the patient contacted their pharmacy? Yes.   (Agent: If no, request that the patient contact the pharmacy for the refill. If patient does not wish to contact the pharmacy document the reason why and proceed with request.) (Agent: If yes, when and what did the pharmacy advise?)  Preferred Pharmacy (with phone number or street name):  [id number] [place], [place] - [address] AT [facility] [address] Phone: [phone]  Fax: [phone]     Has the patient been seen for an appointment in the last year OR does the patient have an upcoming appointment? Yes.    Agent: Please be advised that RX refills may take up to 3 business days. We ask that you follow-up with your pharmacy.

## 2023-04-18 ENCOUNTER — Other Ambulatory Visit: Payer: Self-pay | Admitting: Family Medicine

## 2023-04-19 NOTE — Telephone Encounter (Signed)
Called pt on Ruby's phone - left message requesting a call back.

## 2023-04-19 NOTE — Telephone Encounter (Signed)
Call from pt requesting follow up on refill.  Pt states he is completely out of medication. He is worried about side effects of stopping medication abruptly. Pt's phone is not working. He is using his neighbor's phone. Ruby's number is [phone]. If a call needs to be made please tell Ruby that this is a medical call for pt.

## 2023-04-19 NOTE — Telephone Encounter (Signed)
Pt. Calling again about refill for Venlafaxine. Instructed this has been sent to a provider.

## 2023-04-19 NOTE — Telephone Encounter (Signed)
Requested medications are due for refill today.  yes  Requested medications are on the active medications list.  yes  Last refill. 03/18/2023 #90 0 rf  Future visit scheduled.   no  Notes to clinic.  Pt is out of medication. Pt needs an ov.    Requested Prescriptions  Pending Prescriptions Disp Refills   venlafaxine XR (EFFEXOR-XR) 75 MG 24 hr capsule [Pharmacy Med Name: VENLAFAXINE ER 75MG  CAPSULES] 90 capsule 0    Sig: TAKE 3 CAPSULES(225 MG) BY MOUTH DAILY WITH BREAKFAST     Psychiatry: Antidepressants - SNRI - desvenlafaxine & venlafaxine Failed - 04/19/2023 12:09 PM      Failed - Cr in normal range and within 360 days    Creat  Date Value Ref Range Status  10/13/2017 1.05 0.70 - 1.25 mg/dL Final    Comment:    For patients >52 years of age, the reference limit for Creatinine is approximately 13% higher for people identified as African-American. .    Creatinine, Ser  Date Value Ref Range Status  02/26/2022 1.80 (H) 0.76 - 1.27 mg/dL Final         Failed - Valid encounter within last 6 months    Recent Outpatient Visits           1 year ago Annual physical exam   [place] [facility] Malva Limes, MD   1 year ago Primary hypertension   [place] [facility] Malva Limes, MD   2 years ago Hyperglycemia   [facility] Malva Limes, MD   2 years ago Hyperglycemia   [facility] Malva Limes, MD   2 years ago Cellulitis of left lower extremity   [place] [facility] [place], [place], [place]              Failed - Lipid Panel in normal range within the last 12 months    Cholesterol, Total  Date Value Ref Range Status  02/26/2022 225 (H) 100 - 199 mg/dL Final   LDL Cholesterol (Calc)  Date Value Ref Range Status  10/13/2017 83 mg/dL (calc) Final    Comment:    Reference range: <100 . Desirable range <100 mg/dL for primary prevention;    <70 mg/dL for patients with CHD or diabetic patients  with > or = 2 CHD risk factors. Marland Kitchen LDL-C is now calculated using the Martin-Hopkins  calculation, which is a validated novel method providing  better accuracy than the Friedewald equation in the  estimation of LDL-C.  Horald Pollen et al. Lenox Ahr. 1324;401(02): 2061-2068  (http://education.[facility].com/faq/FAQ164)    LDL Chol Calc (NIH)  Date Value Ref Range Status  02/26/2022 139 (H) 0 - 99 mg/dL Final   HDL  Date Value Ref Range Status  02/26/2022 34 (L) >39 mg/dL Final   Triglycerides  Date Value Ref Range Status  02/26/2022 285 (H) 0 - 149 mg/dL Final         Passed - Last BP in normal range    BP Readings from Last 1 Encounters:  02/26/22 105/60

## 2023-04-20 ENCOUNTER — Telehealth: Payer: Self-pay

## 2023-04-20 ENCOUNTER — Other Ambulatory Visit: Payer: Self-pay | Admitting: Family Medicine

## 2023-04-20 NOTE — Telephone Encounter (Signed)
Tried calling patient back and phone would mess up each time.     Copied from CRM [id number]. Topic: Appointment Scheduling - Scheduling Inquiry for Clinic >> Apr 20, 2023  2:38 PM Geoffry Paradise G wrote: Patient states that if he needs an appointment for medication, July 31 is too far out. Can he be scheduled sooner.  Please advise.

## 2023-04-21 ENCOUNTER — Telehealth: Payer: Self-pay | Admitting: Family Medicine
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
## 2023-04-21 NOTE — Telephone Encounter (Signed)
Spoke with pt called was disconnected, but medication was sent in today

## 2023-04-21 NOTE — Telephone Encounter (Signed)
Pt is calling in because he has an appointment scheduled for 05/20/23 and wants to know if he can get a prescription for venlafaxine XR (EFFEXOR-XR) 75 MG 24 hr capsule [[id number]] that'll last him until his appointment. Pt says he will not have any medication after tomorrow and says it is important that he gets this medication. Please advise.

## 2023-04-21 NOTE — Telephone Encounter (Signed)
Requested medication (s) are due for refill today:   Yes  Requested medication (s) are on the active medication list:   Yes  Future visit scheduled:   Yes 7/26 with Dr. Sherrie Mustache   Last ordered: 03/18/2023 #90, 0 refills  Returned because unable to refill per protocol, labs are due.    Provider to review for refills until his upcoming appt.     Requested Prescriptions  Pending Prescriptions Disp Refills   venlafaxine XR (EFFEXOR-XR) 75 MG 24 hr capsule [Pharmacy Med Name: VENLAFAXINE ER 75MG  CAPSULES] 90 capsule 0    Sig: TAKE 3 CAPSULES(225 MG) BY MOUTH DAILY WITH BREAKFAST     Psychiatry: Antidepressants - SNRI - desvenlafaxine & venlafaxine Failed - 04/20/2023  2:07 PM      Failed - Cr in normal range and within 360 days    Creat  Date Value Ref Range Status  10/13/2017 1.05 0.70 - 1.25 mg/dL Final    Comment:    For patients >39 years of age, the reference limit for Creatinine is approximately 13% higher for people identified as African-American. .    Creatinine, Ser  Date Value Ref Range Status  02/26/2022 1.80 (H) 0.76 - 1.27 mg/dL Final         Failed - Valid encounter within last 6 months    Recent Outpatient Visits           1 year ago Annual physical exam   [place] [facility] Malva Limes, MD   1 year ago Primary hypertension   [place] [facility] Malva Limes, MD   2 years ago Hyperglycemia   [facility], MD   2 years ago Hyperglycemia   [facility] Mal[facility] Limes, MD   2 years ago Cellulitis of left lower extremity   [place] [facility] Trey Sailors, [place]       Future Appointments             In 4 weeks Sherrie Mustache, Demetrios Isaacs, MD [facility]            Failed - Lipid Panel in normal range within the last 12 months    Cholesterol, Total  Date Value Ref Range Status  02/26/2022 225  (H) 100 - 199 mg/dL Final   LDL Cholesterol (Calc)  Date Value Ref Range Status  10/13/2017 83 mg/dL (calc) Final    Comment:    Reference range: <100 . Desirable range <100 mg/dL for primary prevention;   <70 mg/dL for patients with CHD or diabetic patients  with > or = 2 CHD risk factors. Marland Kitchen LDL-C is now calculated using the Martin-Hopkins  calculation, which is a validated novel method providing  better accuracy than the Friedewald equation in the  estimation of LDL-C.  Horald Pollen et al. Lenox Ahr. 1308;657(84): 2061-2068  (http://education.[facility].com/faq/FAQ164)    LDL Chol Calc (NIH)  Date Value Ref Range Status  02/26/2022 139 (H) 0 - 99 mg/dL Final   HDL  Date Value Ref Range Status  02/26/2022 34 (L) >39 mg/dL Final   Triglycerides  Date Value Ref Range Status  02/26/2022 285 (H) 0 - 149 mg/dL Final         Passed - Last BP in normal range    BP Readings from Last 1 Encounters:  02/26/22 105/60

## 2023-04-21 NOTE — Telephone Encounter (Signed)
Pt says he can be reached at [phone] because he doesn't have a phone.

## 2023-05-03 ENCOUNTER — Other Ambulatory Visit: Payer: Self-pay

## 2023-05-03 ENCOUNTER — Other Ambulatory Visit: Payer: Self-pay | Admitting: Family Medicine

## 2023-05-03 ENCOUNTER — Telehealth: Payer: Self-pay | Admitting: Family Medicine

## 2023-05-03 NOTE — Telephone Encounter (Signed)
Requested medication (s) are due for refill today:yes  Requested medication (s) are on the active medication list: yes  Last refill:  03/18/23 #30  Future visit scheduled: yes  Notes to clinic:  overdue lab work    Requested Prescriptions  Pending Prescriptions Disp Refills   TIADYLT ER 120 MG 24 hr capsule [Pharmacy Med Name: TIADYLT ER 120MG  CAPSULES (24 HR)] 90 capsule     Sig: TAKE 1 CAPSULE(120 MG) BY MOUTH DAILY     Cardiovascular: Calcium Channel Blockers 3 Failed - 05/03/2023 11:23 AM      Failed - ALT in normal range and within 360 days    ALT  Date Value Ref Range Status  02/26/2022 26 0 - 44 IU/L Final         Failed - AST in normal range and within 360 days    AST  Date Value Ref Range Status  02/26/2022 19 0 - 40 IU/L Final         Failed - Cr in normal range and within 360 days    Creat  Date Value Ref Range Status  10/13/2017 1.05 0.70 - 1.25 mg/dL Final    Comment:    For patients >57 years of age, the reference limit for Creatinine is approximately 13% higher for people identified as African-American. .    Creatinine, Ser  Date Value Ref Range Status  02/26/2022 1.80 (H) 0.76 - 1.27 mg/dL Final         Failed - Valid encounter within last 6 months    Recent Outpatient Visits           1 year ago Annual physical exam   [place] [facility], [facility]   1 year ago Primary hypertension   [place] [facility] Mal[facility], MD   2 years ago Hyperglycemia   [facility], MD   2 years ago Hyperglycemia   [facility] Limes, MD   2 years ago Cellulitis of left lower extremity   [place] [facility] Trey [place], [place]       Future Appointments             In 2 weeks Sherrie Mustache, Demetrios Isaacs, MD [facility]            Passed - Last BP in normal range    BP Readings  from Last 1 Encounters:  02/26/22 105/60         Passed - Last Heart Rate in normal range    Pulse Readings from Last 1 Encounters:  02/26/22 97

## 2023-05-03 NOTE — Telephone Encounter (Signed)
[facility] pharmacy requesting prescription refill diltiazem (TIADYLT ER) 120 MG 24 hr capsule   Please advise

## 2023-05-06 ENCOUNTER — Other Ambulatory Visit: Payer: Self-pay | Admitting: Family Medicine

## 2023-05-06 DIAGNOSIS — I10 Essential (primary) hypertension: Secondary | ICD-10-CM

## 2023-05-20 ENCOUNTER — Ambulatory Visit: Payer: Medicare PPO | Admitting: Family Medicine

## 2023-05-20 ENCOUNTER — Encounter: Payer: Medicare PPO | Admitting: Family Medicine

## 2023-05-23 ENCOUNTER — Other Ambulatory Visit: Payer: Self-pay | Admitting: Family Medicine

## 2023-05-23 DIAGNOSIS — E782 Mixed hyperlipidemia: Secondary | ICD-10-CM

## 2023-05-24 NOTE — Telephone Encounter (Signed)
Called patient [phone] to schedule appt for future refills. No answer after multiple rings and no answer, unable to leave message, no VM noted.

## 2023-05-24 NOTE — Telephone Encounter (Signed)
Requested medication (s) are due for refill today: expired medication  Requested medication (s) are on the active medication list: yes   Last refill:  effexor - 04/21/23 #90 0 refills , crestor - 05/03/22 #90 3 refills   Future visit scheduled: no   Notes to clinic:  crestor expired medication do you want to renew Rx?, no refills remain crestor. Last OV greater than 1 year. Attempted to contact patient on [phone] to schedule appt. No answer after multiple rings unable to LM. Do you want to refill Rx?     Requested Prescriptions  Pending Prescriptions Disp Refills   venlafaxine XR (EFFEXOR-XR) 75 MG 24 hr capsule [Pharmacy Med Name: VENLAFAXINE ER 75MG  CAPSULES] 90 capsule 0    Sig: TAKE 3 CAPSULES(225 MG) BY MOUTH DAILY WITH BREAKFAST     Psychiatry: Antidepressants - SNRI - desvenlafaxine & venlafaxine Failed - 05/23/2023 11:39 AM      Failed - Cr in normal range and within 360 days    Creat  Date Value Ref Range Status  10/13/2017 1.05 0.70 - 1.25 mg/dL Final    Comment:    For patients >43 years of age, the reference limit for Creatinine is approximately 13% higher for people identified as African-American. .    Creatinine, Ser  Date Value Ref Range Status  02/26/2022 1.80 (H) 0.76 - 1.27 mg/dL Final         Failed - Valid encounter within last 6 months    Recent Outpatient Visits           1 year ago Annual physical exam   [place] [facility], [facility]   1 year ago Primary hypertension   [place] [facility] Malva Limes, MD   2 years ago Hyperglycemia   [facility] [place] Malva Limes, MD   2 years ago Hyperglycemia   [facility], MD   2 years ago Cellulitis of left lower extremity   [employer] [facility] [place], [place], [place]              Failed - Lipid Panel in normal range within the last 12 months    Cholesterol,  Total  Date Value Ref Range Status  02/26/2022 225 (H) 100 - 199 mg/dL Final   LDL Cholesterol (Calc)  Date Value Ref Range Status  10/13/2017 83 mg/dL (calc) Final    Comment:    Reference range: <100 . Desirable range <100 mg/dL for primary prevention;   <70 mg/dL for patients with CHD or diabetic patients  with > or = 2 CHD risk factors. Marland Kitchen LDL-C is now calculated using the Martin-Hopkins  calculation, which is a validated novel method providing  better accuracy than the Friedewald equation in the  estimation of LDL-C.  Horald Pollen et al. Lenox Ahr. 8657;846(96): 2061-2068  (http://education.[facility].com/faq/FAQ164)    LDL Chol Calc (NIH)  Date Value Ref Range Status  02/26/2022 139 (H) 0 - 99 mg/dL Final   HDL  Date Value Ref Range Status  02/26/2022 34 (L) >39 mg/dL Final   Triglycerides  Date Value Ref Range Status  02/26/2022 285 (H) 0 - 149 mg/dL Final         Passed - Last BP in normal range    BP Readings from Last 1 Encounters:  02/26/22 105/60          rosuvastatin (CRESTOR) 20 MG tablet [Pharmacy Med Name: ROSUVASTATIN 20MG   TABLETS] 90 tablet 3    Sig: TAKE 1 TABLET(20 MG) BY MOUTH DAILY     Cardiovascular:  Antilipid - Statins 2 Failed - 05/23/2023 11:39 AM      Failed - Cr in normal range and within 360 days    Creat  Date Value Ref Range Status  10/13/2017 1.05 0.70 - 1.25 mg/dL Final    Comment:    For patients >13 years of age, the reference limit for Creatinine is approximately 13% higher for people identified as African-American. .    Creatinine, Ser  Date Value Ref Range Status  02/26/2022 1.80 (H) 0.76 - 1.27 mg/dL Final         Failed - Valid encounter within last 12 months    Recent Outpatient Visits           1 year ago Annual physical exam   [place] [facility] Malva Limes, MD   1 year ago Primary hypertension   [place] [facility], MD   2 years ago Hyperglycemia    [facility], MD   2 years ago Hyperglycemia   [facility], MD   2 years ago Cellulitis of left lower extremity   [place] [facility] [place], [place], [place]              Failed - Lipid Panel in normal range within the last 12 months    Cholesterol, Total  Date Value Ref Range Status  02/26/2022 225 (H) 100 - 199 mg/dL Final   LDL Cholesterol (Calc)  Date Value Ref Range Status  10/13/2017 83 mg/dL (calc) Final    Comment:    Reference range: <100 . Desirable range <100 mg/dL for primary prevention;   <70 mg/dL for patients with CHD or diabetic patients  with > or = 2 CHD risk factors. Marland Kitchen LDL-C is now calculated using the Martin-Hopkins  calculation, which is a validated novel method providing  better accuracy than the Friedewald equation in the  estimation of LDL-C.  Horald Pollen et al. Lenox Ahr. 1610;960(45): 2061-2068  (http://education.[facility].com/faq/FAQ164)    LDL Chol Calc (NIH)  Date Value Ref Range Status  02/26/2022 139 (H) 0 - 99 mg/dL Final   HDL  Date Value Ref Range Status  02/26/2022 34 (L) >39 mg/dL Final   Triglycerides  Date Value Ref Range Status  02/26/2022 285 (H) 0 - 149 mg/dL Final         Passed - Patient is not pregnant

## 2023-05-26 ENCOUNTER — Other Ambulatory Visit: Payer: Self-pay | Admitting: Family Medicine

## 2023-05-26 NOTE — Telephone Encounter (Signed)
Requested medications are due for refill today.  yes  Requested medications are on the active medications list.  yes  Last refill. 04/21/2023 #90 0 rf  Future visit scheduled.   no  Notes to clinic.  Pt already given a courtesy refill. Labs are expired.    Requested Prescriptions  Pending Prescriptions Disp Refills   venlafaxine XR (EFFEXOR-XR) 75 MG 24 hr capsule [Pharmacy Med Name: VENLAFAXINE ER 75MG  CAPSULES] 90 capsule 0    Sig: TAKE 3 CAPSULES(225 MG) BY MOUTH DAILY WITH BREAKFAST     Psychiatry: Antidepressants - SNRI - desvenlafaxine & venlafaxine Failed - 05/26/2023 10:15 AM      Failed - Cr in normal range and within 360 days    Creat  Date Value Ref Range Status  10/13/2017 1.05 0.70 - 1.25 mg/dL Final    Comment:    For patients >39 years of age, the reference limit for Creatinine is approximately 13% higher for people identified as African-American. .    Creatinine, Ser  Date Value Ref Range Status  02/26/2022 1.80 (H) 0.76 - 1.27 mg/dL Final         Failed - Valid encounter within last 6 months    Recent Outpatient Visits           1 year ago Annual physical exam   [facility] Mal[facility] Limes, MD   1 year ago Primary hypertension   [place] [facility] Mal[facility] Limes, MD   2 years ago Hyperglycemia   [facility], MD   2 years ago Hyperglycemia   [facility] Mal[facility] Limes, MD   2 years ago Cellulitis of left lower extremity   [place] [facility] [place], [place], [place]              Failed - Lipid Panel in normal range within the last 12 months    Cholesterol, Total  Date Value Ref Range Status  02/26/2022 225 (H) 100 - 199 mg/dL Final   LDL Cholesterol (Calc)  Date Value Ref Range Status  10/13/2017 83 mg/dL (calc) Final    Comment:    Reference range: <100 . Desirable range <100 mg/dL for primary  prevention;   <70 mg/dL for patients with CHD or diabetic patients  with > or = 2 CHD risk factors. Marland Kitchen LDL-C is now calculated using the Martin-Hopkins  calculation, which is a validated novel method providing  better accuracy than the Friedewald equation in the  estimation of LDL-C.  Horald Pollen et al. Lenox Ahr. 7829;562(13): 2061-2068  (http://education.[facility].com/faq/FAQ164)    LDL Chol Calc (NIH)  Date Value Ref Range Status  02/26/2022 139 (H) 0 - 99 mg/dL Final   HDL  Date Value Ref Range Status  02/26/2022 34 (L) >39 mg/dL Final   Triglycerides  Date Value Ref Range Status  02/26/2022 285 (H) 0 - 149 mg/dL Final         Passed - Last BP in normal range    BP Readings from Last 1 Encounters:  02/26/22 105/60

## 2023-05-29 ENCOUNTER — Emergency Department: Payer: Medicare HMO

## 2023-05-29 ENCOUNTER — Other Ambulatory Visit: Payer: Self-pay

## 2023-05-29 ENCOUNTER — Encounter: Payer: Self-pay | Admitting: Emergency Medicine

## 2023-05-29 ENCOUNTER — Inpatient Hospital Stay
Admission: EM | Admit: 2023-05-29 | Discharge: 2023-06-02 | DRG: 871 | Disposition: A | Discharge status: Other Acute Inpatient Hospital | Payer: Medicare HMO | Attending: Internal Medicine | Admitting: Internal Medicine

## 2023-05-29 DIAGNOSIS — Z6830 Body mass index (BMI) 30.0-30.9, adult: Secondary | ICD-10-CM

## 2023-05-29 DIAGNOSIS — E785 Hyperlipidemia, unspecified: Secondary | ICD-10-CM | POA: Diagnosis present

## 2023-05-29 DIAGNOSIS — A4151 Sepsis due to Escherichia coli [E. coli]: Principal | ICD-10-CM | POA: Diagnosis present

## 2023-05-29 DIAGNOSIS — N182 Chronic kidney disease, stage 2 (mild): Secondary | ICD-10-CM | POA: Diagnosis present

## 2023-05-29 DIAGNOSIS — N170 Acute kidney failure with tubular necrosis: Secondary | ICD-10-CM | POA: Diagnosis present

## 2023-05-29 DIAGNOSIS — J4531 Mild persistent asthma with (acute) exacerbation: Secondary | ICD-10-CM | POA: Diagnosis present

## 2023-05-29 DIAGNOSIS — I129 Hypertensive chronic kidney disease with stage 1 through stage 4 chronic kidney disease, or unspecified chronic kidney disease: Secondary | ICD-10-CM | POA: Diagnosis present

## 2023-05-29 DIAGNOSIS — E872 Acidosis, unspecified: Secondary | ICD-10-CM | POA: Diagnosis not present

## 2023-05-29 DIAGNOSIS — I2510 Atherosclerotic heart disease of native coronary artery without angina pectoris: Secondary | ICD-10-CM | POA: Diagnosis present

## 2023-05-29 DIAGNOSIS — Z791 Long term (current) use of non-steroidal anti-inflammatories (NSAID): Secondary | ICD-10-CM

## 2023-05-29 DIAGNOSIS — B9620 Unspecified Escherichia coli [E. coli] as the cause of diseases classified elsewhere: Secondary | ICD-10-CM | POA: Diagnosis not present

## 2023-05-29 DIAGNOSIS — I10 Essential (primary) hypertension: Secondary | ICD-10-CM | POA: Diagnosis present

## 2023-05-29 DIAGNOSIS — J849 Interstitial pulmonary disease, unspecified: Secondary | ICD-10-CM | POA: Diagnosis present

## 2023-05-29 DIAGNOSIS — E876 Hypokalemia: Secondary | ICD-10-CM | POA: Diagnosis not present

## 2023-05-29 DIAGNOSIS — I6523 Occlusion and stenosis of bilateral carotid arteries: Secondary | ICD-10-CM | POA: Diagnosis not present

## 2023-05-29 DIAGNOSIS — G928 Other toxic encephalopathy: Secondary | ICD-10-CM | POA: Diagnosis not present

## 2023-05-29 DIAGNOSIS — J441 Chronic obstructive pulmonary disease with (acute) exacerbation: Secondary | ICD-10-CM | POA: Diagnosis not present

## 2023-05-29 DIAGNOSIS — R29898 Other symptoms and signs involving the musculoskeletal system: Secondary | ICD-10-CM | POA: Diagnosis not present

## 2023-05-29 DIAGNOSIS — R9089 Other abnormal findings on diagnostic imaging of central nervous system: Secondary | ICD-10-CM | POA: Diagnosis not present

## 2023-05-29 DIAGNOSIS — R739 Hyperglycemia, unspecified: Secondary | ICD-10-CM | POA: Diagnosis present

## 2023-05-29 DIAGNOSIS — I672 Cerebral atherosclerosis: Secondary | ICD-10-CM | POA: Diagnosis not present

## 2023-05-29 DIAGNOSIS — E871 Hypo-osmolality and hyponatremia: Secondary | ICD-10-CM | POA: Diagnosis present

## 2023-05-29 DIAGNOSIS — Z79899 Other long term (current) drug therapy: Secondary | ICD-10-CM

## 2023-05-29 DIAGNOSIS — F32A Depression, unspecified: Secondary | ICD-10-CM | POA: Diagnosis present

## 2023-05-29 DIAGNOSIS — G929 Unspecified toxic encephalopathy: Secondary | ICD-10-CM | POA: Diagnosis not present

## 2023-05-29 DIAGNOSIS — Z808 Family history of malignant neoplasm of other organs or systems: Secondary | ICD-10-CM

## 2023-05-29 DIAGNOSIS — N183 Chronic kidney disease, stage 3 unspecified: Secondary | ICD-10-CM | POA: Diagnosis present

## 2023-05-29 DIAGNOSIS — T502X5A Adverse effect of carbonic-anhydrase inhibitors, benzothiadiazides and other diuretics, initial encounter: Secondary | ICD-10-CM | POA: Diagnosis not present

## 2023-05-29 DIAGNOSIS — M1990 Unspecified osteoarthritis, unspecified site: Secondary | ICD-10-CM | POA: Diagnosis present

## 2023-05-29 DIAGNOSIS — R4182 Altered mental status, unspecified: Secondary | ICD-10-CM | POA: Diagnosis not present

## 2023-05-29 DIAGNOSIS — G894 Chronic pain syndrome: Secondary | ICD-10-CM | POA: Diagnosis present

## 2023-05-29 DIAGNOSIS — R569 Unspecified convulsions: Secondary | ICD-10-CM | POA: Diagnosis present

## 2023-05-29 DIAGNOSIS — E669 Obesity, unspecified: Secondary | ICD-10-CM | POA: Diagnosis present

## 2023-05-29 DIAGNOSIS — K4020 Bilateral inguinal hernia, without obstruction or gangrene, not specified as recurrent: Secondary | ICD-10-CM | POA: Diagnosis not present

## 2023-05-29 DIAGNOSIS — Z0389 Encounter for observation for other suspected diseases and conditions ruled out: Secondary | ICD-10-CM | POA: Diagnosis not present

## 2023-05-29 DIAGNOSIS — N400 Enlarged prostate without lower urinary tract symptoms: Secondary | ICD-10-CM | POA: Diagnosis present

## 2023-05-29 DIAGNOSIS — Z1152 Encounter for screening for COVID-19: Secondary | ICD-10-CM

## 2023-05-29 DIAGNOSIS — F05 Delirium due to known physiological condition: Secondary | ICD-10-CM | POA: Diagnosis not present

## 2023-05-29 DIAGNOSIS — A419 Sepsis, unspecified organism: Secondary | ICD-10-CM | POA: Diagnosis not present

## 2023-05-29 DIAGNOSIS — Z8249 Family history of ischemic heart disease and other diseases of the circulatory system: Secondary | ICD-10-CM

## 2023-05-29 DIAGNOSIS — T50905A Adverse effect of unspecified drugs, medicaments and biological substances, initial encounter: Secondary | ICD-10-CM | POA: Diagnosis present

## 2023-05-29 DIAGNOSIS — R410 Disorientation, unspecified: Secondary | ICD-10-CM

## 2023-05-29 DIAGNOSIS — R0989 Other specified symptoms and signs involving the circulatory and respiratory systems: Secondary | ICD-10-CM | POA: Diagnosis not present

## 2023-05-29 DIAGNOSIS — N390 Urinary tract infection, site not specified: Secondary | ICD-10-CM | POA: Diagnosis not present

## 2023-05-29 DIAGNOSIS — R29818 Other symptoms and signs involving the nervous system: Secondary | ICD-10-CM | POA: Diagnosis not present

## 2023-05-29 DIAGNOSIS — Z882 Allergy status to sulfonamides status: Secondary | ICD-10-CM

## 2023-05-29 DIAGNOSIS — J9611 Chronic respiratory failure with hypoxia: Secondary | ICD-10-CM | POA: Diagnosis present

## 2023-05-29 DIAGNOSIS — K429 Umbilical hernia without obstruction or gangrene: Secondary | ICD-10-CM | POA: Diagnosis not present

## 2023-05-29 DIAGNOSIS — I6782 Cerebral ischemia: Secondary | ICD-10-CM | POA: Diagnosis not present

## 2023-05-29 DIAGNOSIS — F419 Anxiety disorder, unspecified: Secondary | ICD-10-CM | POA: Diagnosis not present

## 2023-05-29 DIAGNOSIS — Z9981 Dependence on supplemental oxygen: Secondary | ICD-10-CM | POA: Diagnosis not present

## 2023-05-29 DIAGNOSIS — R6520 Severe sepsis without septic shock: Secondary | ICD-10-CM | POA: Diagnosis present

## 2023-05-29 DIAGNOSIS — M5416 Radiculopathy, lumbar region: Secondary | ICD-10-CM | POA: Diagnosis present

## 2023-05-29 DIAGNOSIS — E440 Moderate protein-calorie malnutrition: Secondary | ICD-10-CM | POA: Diagnosis not present

## 2023-05-29 DIAGNOSIS — Z803 Family history of malignant neoplasm of breast: Secondary | ICD-10-CM

## 2023-05-29 DIAGNOSIS — Z820 Family history of epilepsy and other diseases of the nervous system: Secondary | ICD-10-CM

## 2023-05-29 LAB — GLUCOSE, CAPILLARY: Glucose-Capillary: 81 mg/dL (ref 70–99)

## 2023-05-29 LAB — COMPREHENSIVE METABOLIC PANEL
ALT: 73 U/L — ABNORMAL HIGH (ref 0–44)
AST: 48 U/L — ABNORMAL HIGH (ref 15–41)
Albumin: 3.5 g/dL (ref 3.5–5.0)
Alkaline Phosphatase: 101 U/L (ref 38–126)
Anion gap: 17 — ABNORMAL HIGH (ref 5–15)
BUN: 30 mg/dL — ABNORMAL HIGH (ref 8–23)
CO2: 27 mmol/L (ref 22–32)
Calcium: 8.5 mg/dL — ABNORMAL LOW (ref 8.9–10.3)
Chloride: 81 mmol/L — ABNORMAL LOW (ref 98–111)
Creatinine, Ser: 1.91 mg/dL — ABNORMAL HIGH (ref 0.61–1.24)
GFR, Estimated: 38 mL/min — ABNORMAL LOW (ref >60)
Glucose, Bld: 147 mg/dL — ABNORMAL HIGH (ref 70–99)
Potassium: <2 mmol/L — CL (ref 3.5–5.1)
Sodium: 125 mmol/L — ABNORMAL LOW (ref 135–145)
Total Bilirubin: 0.7 mg/dL (ref 0.3–1.2)
Total Protein: 7.3 g/dL (ref 6.5–8.1)

## 2023-05-29 LAB — BLOOD GAS, VENOUS
Acid-Base Excess: 6.6 mmol/L — ABNORMAL HIGH (ref 0.0–2.0)
Bicarbonate: 29.1 mmol/L — ABNORMAL HIGH (ref 20.0–28.0)
pCO2, Ven: 34 mmHg — ABNORMAL LOW (ref 44–60)
pH, Ven: 7.54 — ABNORMAL HIGH (ref 7.25–7.43)

## 2023-05-29 LAB — URINALYSIS, W/ REFLEX TO CULTURE (INFECTION SUSPECTED)
Bilirubin Urine: NEGATIVE
Glucose, UA: NEGATIVE mg/dL
Ketones, ur: NEGATIVE mg/dL
Nitrite: POSITIVE — AB
Protein, ur: NEGATIVE mg/dL
Specific Gravity, Urine: 1.005 (ref 1.005–1.030)
WBC, UA: >50 WBC/hpf (ref 0–5)
pH: 7 (ref 5.0–8.0)

## 2023-05-29 LAB — T4, FREE: Free T4: 1.17 ng/dL — ABNORMAL HIGH (ref 0.61–1.12)

## 2023-05-29 LAB — BASIC METABOLIC PANEL
Anion gap: 16 — ABNORMAL HIGH (ref 5–15)
BUN: 29 mg/dL — ABNORMAL HIGH (ref 8–23)
CO2: 25 mmol/L (ref 22–32)
Calcium: 7.7 mg/dL — ABNORMAL LOW (ref 8.9–10.3)
Chloride: 87 mmol/L — ABNORMAL LOW (ref 98–111)
Creatinine, Ser: 1.79 mg/dL — ABNORMAL HIGH (ref 0.61–1.24)
GFR, Estimated: 42 mL/min — ABNORMAL LOW (ref >60)
Glucose, Bld: 89 mg/dL (ref 70–99)
Potassium: <2 mmol/L — CL (ref 3.5–5.1)
Sodium: 128 mmol/L — ABNORMAL LOW (ref 135–145)

## 2023-05-29 LAB — URINE DRUG SCREEN, QUALITATIVE (ARMC ONLY)
Amphetamines, Ur Screen: NOT DETECTED
Barbiturates, Ur Screen: NOT DETECTED
Benzodiazepine, Ur Scrn: POSITIVE — AB
Cannabinoid 50 Ng, Ur ~~LOC~~: POSITIVE — AB
Cocaine Metabolite,Ur ~~LOC~~: NOT DETECTED
MDMA (Ecstasy)Ur Screen: NOT DETECTED
Methadone Scn, Ur: NOT DETECTED
Opiate, Ur Screen: POSITIVE — AB
Phencyclidine (PCP) Ur S: NOT DETECTED
Tricyclic, Ur Screen: NOT DETECTED

## 2023-05-29 LAB — CBC
HCT: 30.2 % — ABNORMAL LOW (ref 39.0–52.0)
Hemoglobin: 11.1 g/dL — ABNORMAL LOW (ref 13.0–17.0)
MCH: 30.3 pg (ref 26.0–34.0)
MCHC: 36.8 g/dL — ABNORMAL HIGH (ref 30.0–36.0)
MCV: 82.5 fL (ref 80.0–100.0)
Platelets: 725 10*3/uL — ABNORMAL HIGH (ref 150–400)
RBC: 3.66 MIL/uL — ABNORMAL LOW (ref 4.22–5.81)
RDW: 13.2 % (ref 11.5–15.5)
WBC: 19.6 10*3/uL — ABNORMAL HIGH (ref 4.0–10.5)
nRBC: 0 % (ref 0.0–0.2)

## 2023-05-29 LAB — AMMONIA: Ammonia: 14 umol/L (ref 9–35)

## 2023-05-29 LAB — RESP PANEL BY RT-PCR (RSV, FLU A&B, COVID)  RVPGX2
Influenza A by PCR: NEGATIVE
Influenza B by PCR: NEGATIVE
Resp Syncytial Virus by PCR: NEGATIVE
SARS Coronavirus 2 by RT PCR: NEGATIVE

## 2023-05-29 LAB — SALICYLATE LEVEL: Salicylate Lvl: <7 mg/dL — ABNORMAL LOW (ref 7.0–30.0)

## 2023-05-29 LAB — APTT: aPTT: 31 seconds (ref 24–36)

## 2023-05-29 LAB — ETHANOL: Alcohol, Ethyl (B): <10 mg/dL (ref <10)

## 2023-05-29 LAB — LACTIC ACID, PLASMA
Lactic Acid, Venous: 2.5 mmol/L (ref 0.5–1.9)
Lactic Acid, Venous: 2.2 mmol/L (ref 0.5–1.9)

## 2023-05-29 LAB — TSH: TSH: 1.297 u[IU]/mL (ref 0.350–4.500)

## 2023-05-29 LAB — PROCALCITONIN: Procalcitonin: <0.1 ng/mL

## 2023-05-29 LAB — PROTIME-INR
INR: 1.1 (ref 0.8–1.2)
Prothrombin Time: 14 seconds (ref 11.4–15.2)

## 2023-05-29 LAB — ACETAMINOPHEN LEVEL: Acetaminophen (Tylenol), Serum: <10 ug/mL — ABNORMAL LOW (ref 10–30)

## 2023-05-29 MED ORDER — POTASSIUM CHLORIDE IN NACL 40-0.9 MEQ/L-% IV SOLN
INTRAVENOUS | Status: DC
  Filled 2023-05-29 (×5): qty 1000

## 2023-05-29 MED ORDER — VANCOMYCIN HCL IN DEXTROSE 1-5 GM/200ML-% IV SOLN: 1000.0000 mg | Freq: Once | INTRAVENOUS | Status: DC

## 2023-05-29 MED ORDER — SODIUM CHLORIDE 0.9 % IV BOLUS (SEPSIS)
1000.0000 mL | Freq: Once | INTRAVENOUS | Status: AC
  Administered 2023-05-29: 1000 mL via INTRAVENOUS

## 2023-05-29 MED ORDER — INSULIN ASPART 100 UNIT/ML IJ SOLN
0.0000 [IU] | INTRAMUSCULAR | Status: DC
  Administered 2023-06-01: 1 [IU] via SUBCUTANEOUS
  Administered 2023-06-01: 5 [IU] via SUBCUTANEOUS
  Filled 2023-05-29 (×2): qty 1

## 2023-05-29 MED ORDER — POLYETHYLENE GLYCOL 3350 17 G PO PACK: 17.0000 g | PACK | Freq: Every day | ORAL | Status: DC | PRN

## 2023-05-29 MED ORDER — LORAZEPAM 2 MG/ML IJ SOLN
INTRAMUSCULAR | Status: AC
  Administered 2023-05-29: 2 mg via INTRAVENOUS
  Filled 2023-05-29: qty 1

## 2023-05-29 MED ORDER — SODIUM CHLORIDE 0.9 % IV BOLUS (SEPSIS)
250.0000 mL | Freq: Once | INTRAVENOUS | Status: AC
  Administered 2023-05-29: 250 mL via INTRAVENOUS

## 2023-05-29 MED ORDER — LACTATED RINGERS IV SOLN: INTRAVENOUS | Status: DC

## 2023-05-29 MED ORDER — VANCOMYCIN HCL 1500 MG/300ML IV SOLN
1500.0000 mg | Freq: Once | INTRAVENOUS | Status: AC
  Administered 2023-05-29: 1500 mg via INTRAVENOUS
  Filled 2023-05-29: qty 300

## 2023-05-29 MED ORDER — DIPHENHYDRAMINE HCL 50 MG/ML IJ SOLN
25.0000 mg | Freq: Once | INTRAMUSCULAR | Status: AC
  Administered 2023-05-29: 25 mg via INTRAVENOUS
  Filled 2023-05-29: qty 1

## 2023-05-29 MED ORDER — METRONIDAZOLE 500 MG/100ML IV SOLN
500.0000 mg | Freq: Once | INTRAVENOUS | Status: AC
  Administered 2023-05-29: 500 mg via INTRAVENOUS
  Filled 2023-05-29: qty 100

## 2023-05-29 MED ORDER — DOCUSATE SODIUM 100 MG PO CAPS: 100.0000 mg | ORAL_CAPSULE | Freq: Two times a day (BID) | ORAL | Status: DC | PRN

## 2023-05-29 MED ORDER — HEPARIN SODIUM (PORCINE) 5000 UNIT/ML IJ SOLN
5000.0000 [IU] | Freq: Three times a day (TID) | INTRAMUSCULAR | Status: DC
  Administered 2023-05-29 – 2023-05-30 (×2): 5000 [IU] via SUBCUTANEOUS
  Filled 2023-05-29 (×2): qty 1

## 2023-05-29 MED ORDER — IPRATROPIUM-ALBUTEROL 0.5-2.5 (3) MG/3ML IN SOLN
3.0000 mL | Freq: Four times a day (QID) | RESPIRATORY_TRACT | Status: DC
  Administered 2023-05-30 (×2): 3 mL via RESPIRATORY_TRACT
  Filled 2023-05-29 (×2): qty 3

## 2023-05-29 MED ORDER — IPRATROPIUM-ALBUTEROL 0.5-2.5 (3) MG/3ML IN SOLN
3.0000 mL | Freq: Four times a day (QID) | RESPIRATORY_TRACT | Status: DC | PRN
  Administered 2023-05-30: 3 mL via RESPIRATORY_TRACT

## 2023-05-29 MED ORDER — POTASSIUM CHLORIDE 10 MEQ/100ML IV SOLN
10.0000 meq | INTRAVENOUS | Status: AC
  Administered 2023-05-29 – 2023-05-30 (×5): 10 meq via INTRAVENOUS
  Filled 2023-05-29 (×5): qty 100

## 2023-05-29 MED ORDER — SODIUM BICARBONATE 8.4 % IV SOLN
100.0000 meq | Freq: Once | INTRAVENOUS | Status: AC
  Administered 2023-05-29: 100 meq via INTRAVENOUS
  Filled 2023-05-29: qty 50

## 2023-05-29 MED ORDER — SODIUM CHLORIDE 0.9 % IV SOLN
2.0000 g | Freq: Once | INTRAVENOUS | Status: AC
  Administered 2023-05-29: 2 g via INTRAVENOUS
  Filled 2023-05-29: qty 12.5

## 2023-05-29 MED ORDER — HALOPERIDOL LACTATE 5 MG/ML IJ SOLN
INTRAMUSCULAR | Status: AC
  Administered 2023-05-29: 5 mg via INTRAVENOUS
  Filled 2023-05-29: qty 1

## 2023-05-29 MED ORDER — IOHEXOL 300 MG/ML  SOLN
80.0000 mL | Freq: Once | INTRAMUSCULAR | Status: AC | PRN
  Administered 2023-05-29: 80 mL via INTRAVENOUS

## 2023-05-29 MED ORDER — LORAZEPAM 2 MG/ML IJ SOLN: 2.0000 mg | Freq: Once | INTRAMUSCULAR | Status: AC

## 2023-05-29 MED ORDER — HALOPERIDOL LACTATE 5 MG/ML IJ SOLN: 5.0000 mg | Freq: Once | INTRAMUSCULAR | Status: AC

## 2023-05-29 NOTE — Progress Notes (Signed)
CODE SEPSIS - PHARMACY COMMUNICATION  **Broad Spectrum Antibiotics should be administered within 1 hour of Sepsis diagnosis**  Time Code Sepsis Called/Page Received: 1814  Antibiotics Ordered: Vancomycin and Cefepime  Time of 1st antibiotic administration: 1835  Additional action taken by pharmacy: None  If necessary, Name of Provider/Nurse Contacted: None    Rockwell Alexandria ,PharmD Clinical Pharmacist  05/29/2023  6:46 PM

## 2023-05-29 NOTE — Progress Notes (Signed)
eLink Physician-Brief Progress Note Patient Name: Clarence Hamilton DOB: 1956-09-24 MRN: [id number]   Date of Service  05/29/2023  HPI/Events of Note  67 year old male who presented the emergency room with altered mental status.  Suspected to have acute encephalopathy secondary to pyuria/UTI.  Confirmatory workup pending.  eICU Interventions  Patient's chart reviewed.  Video assessment done.  Workup for toxic metabolic encephalopathy ongoing however currently the working diagnosis is altered mental status secondary to pyuria/UTI. Continue care as documented in APP's notes.     Intervention Category Evaluation Type: New Patient Evaluation  Carilyn Goodpasture 05/29/2023, 10:50 PM

## 2023-05-29 NOTE — ED Notes (Signed)
Dr. Vicente Males made aware of pt and that he states he uses oxygen at home, but was 99% on RA. No orders to place pt on oxygen at this time.

## 2023-05-29 NOTE — H&P (Incomplete)
NAME:  Dillian Feig, MRN:  [id number], DOB:  July 18, 1956, LOS: 0 ADMISSION DATE:  05/29/2023, CONSULTATION DATE:  05/29/2023 REFERRING MD:  Donna Bernard, CHIEF COMPLAINT:  Altered Mental status    HPI  67 y.o male with significant PMH of Mild persistent Asthma/COPD On oxygen at 3 liters. recurrent flares, intolerant to steroids, HTN, HLD, Prediabetes,  Lumbar radiculopathy, chronic pain syndrome,anxiety and depression who presented to the ED with chief complaints of altered mental status.  Patient presented with c/o increasing anxiety and "dark thoughts". He report that he is ran out of his pain medication and other prescriptions for his depression. He is not a very good historian and is unclear what medications he is currently taking. He is prescribed Effexor which appeared to have been refilled on 04/21/23. He also takes Alprazolam and Buspar. Patient report that he takes medication as prescribed but unclear which medication he took today. He is also reporting diarrhea and burning with urination.   ED Course: Initial vital signs showed HR of beats/minute, BP mm Hg, the RR 30 breaths/minute, and the oxygen saturation % on and a temperature of 98.23F (36.9C). Patient was lethargic, moaned intermittently, and responded with one-word answers; symmetric movement in the arms and legs was observed. Pertinent Labs/Diagnostics Findings: Na+/ K+125/2.0  Glucose: 147 BUN/Cr.:30/1.91 AST/ALT:48/73 WBC:19.6 Hgb/Hct: 11.1/30.2 Plts: 725 Lactic acid: 2.5 PCT <0.10 COVID PCR: Negative,  UDS+ Opiate, Benzo and cannabinoid UA+ UTI VBG: pO2 pend; pCO2 34; pH 7.54;  HCO3 29.1, %O2 Sat pend.  CXR> CTH> Negative CTA Chest> No acute finding CT Abd/pelvis>No acute finding  Patient given 30 cc/kg of fluids and started on broad-spectrum antibiotics Vanco & cefepime for suspected sepsis secondary to suspected UTI. Patient remained altered and restless requiring benzos, Haldol and sitter at the bedside for safety.  Due to persistent altered mental status, PCCM consulted.  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 recurrent flares, intolerant to steroids, HTN, HLD, Prediabetes,  Lumbar radiculopathy, chronic pain syndrome,anxiety and depression  Significant Hospital Events   8/4: Admitted ICU with altered mental status  Consults:  Psychiatric  Procedures:  None  Significant Diagnostic Tests:  8/4: Chest Xray>IMPRESSION: Low volumes.  No active cardiopulmonary disease.  8/4: Noncontrast CT head> IMPRESSION: No acute intracranial abnormality.  8/4: CTA Chest, abdomen and pelvis>IMPRESSION: 1. Examination is generally limited by patient and breath motion artifact throughout. 2. Within this limitation, no acute CT findings of the chest, abdomen, or pelvis to explain sepsis. 3. Tracheobronchomalacia. Diffuse bilateral bronchial wall thickening. Mosaic attenuation of the airspaces, suggestive of small airways disease. 4. Coronary artery disease. 5. Prostatomegaly.  Interim History / Subjective:    -  Micro Data:  8/4: SARS-CoV-2 PCR> negative 8/4: Influenza PCR> negative 8/4: Blood culture x2> 8/4: Urine Culture> 8/4: MRSA PCR>>   Antimicrobials:  Vancomycin 05/29/23 x 1 Cefepime 05/29/23 x 1 Ceftriaxone 05/29/23 >>   OBJECTIVE  Blood pressure 127/78, pulse 89, temperature 99.9 F (37.7 C), temperature source Oral, resp. rate (!) 23, height 5\' 8"  (1.727 m), weight 72.6 kg, SpO2 100%.       No intake or output data in the 24 hours ending 05/29/23 2204 Filed Weights   05/29/23 1614  Weight: 72.6 kg   Physical Examination  GENERAL: 67 year-old critically ill patient lying in the bed in no acute distress EYES: PEERLA. No scleral icterus. Extraocular muscles intact.  HEENT: Head atraumatic, normocephalic. Oropharynx and nasopharynx clear.  NECK:  No JVD, supple  LUNGS: Normal breath sounds bilaterally.  No use of accessory muscles of respiration.   CARDIOVASCULAR: S1, S2 normal. No murmurs, rubs, or gallops.  ABDOMEN: Soft, NTND EXTREMITIES: No swelling or erythema.  Capillary refill > 3 seconds in all extremities. Pulses palpable distally. NEUROLOGIC: The patient is  alert and oriented x 3 with intermittent consfusion. No new or focal neurological deficit appreciated. Cranial nerves are intact.  SKIN: No obvious rash, lesion, or ulcer. Warm to touch Labs/imaging that I havepersonally reviewed  (right click and "Reselect all SmartList Selections" daily)     Labs   CBC: Recent Labs  Lab 05/29/23 1618  WBC 19.6*  HGB 11.1*  HCT 30.2*  MCV 82.5  PLT 725*    Basic Metabolic Panel: Recent Labs  Lab 05/29/23 1618 05/29/23 1926  NA 125* 128*  K <2.0* <2.0*  CL 81* 87*  CO2 27 25  GLUCOSE 147* 89  BUN 30* 29*  CREATININE 1.91* 1.79*  CALCIUM 8.5* 7.7*   GFR: Estimated Creatinine Clearance: 39.8 mL/min (A) (by C-G formula based on SCr of 1.79 mg/dL (H)). Recent Labs  Lab 05/29/23 1618 05/29/23 1816 05/29/23 1941  WBC 19.6*  --   --   LATICACIDVEN  --  2.5* 2.2*    Liver Function Tests: Recent Labs  Lab 05/29/23 1618  AST 48*  ALT 73*  ALKPHOS 101  BILITOT 0.7  PROT 7.3  ALBUMIN 3.5   No results for input(s): "LIPASE", "AMYLASE" in the last 168 hours. Recent Labs  Lab 05/29/23 1816  AMMONIA 14    ABG    Component Value Date/Time   HCO3 29.1 (H) 05/29/2023 2000   O2SAT PENDING 05/29/2023 2000     Coagulation Profile: Recent Labs  Lab 05/29/23 1816  INR 1.1    Cardiac Enzymes: No results for input(s): "CKTOTAL", "CKMB", "CKMBINDEX", "TROPONINI" in the last 168 hours.  HbA1C: Hgb A1c MFr Bld  Date/Time Value Ref Range Status  02/26/2022 04:06 PM 5.9 (H) 4.8 - 5.6 % Final    Comment:             Prediabetes: 5.7 - 6.4          Diabetes: >6.4          Glycemic control for adults with diabetes: <7.0   08/25/2021 03:40 PM 6.1 (H) 4.8 - 5.6 % Final    Comment:             Prediabetes:  5.7 - 6.4          Diabetes: >6.4          Glycemic control for adults with diabetes: <7.0     CBG: No results for input(s): "GLUCAP" in the last 168 hours.  Review of Systems:   Unable to be obtained secondary to the patient's altered mental status status.    Past Medical History  He,  has a past medical history of Anxiety, Arthritis, Asthma, History of chicken pox, Hyperglycemia, Hyperlipidemia, Hypertension, and Obesity.   Surgical History    Past Surgical History:  Procedure Laterality Date  . HERNIA REPAIR  1970's   Inguinal hernia repair x 2   . TONSILLECTOMY AND ADENOIDECTOMY  1960's     Social History   reports that he has never smoked. He has never used smokeless tobacco. He reports that he does not drink alcohol and does not use drugs.   Family History   His family history includes Breast cancer in his mother; Hypertension in his father and  mother; Melanoma in his father; Parkinson's disease in his father; Throat cancer in his paternal grandmother.   Allergies Allergies  Allergen Reactions  . Pollen Extract Cough  . Sulfa Antibiotics   . Tetracycline     Headache  . Tramadol     insomnia  . Corticosteroids     Other reaction(s): Muscle Pain     Home Medications  Prior to Admission medications   Medication Sig Start Date End Date Taking? Authorizing Provider  albuterol (PROVENTIL) (2.5 MG/3ML) 0.083% nebulizer solution SMARTSIG:3 Milliliter(s) Via Nebulizer Every 6 Hours PRN 10/08/21   [provider]  albuterol (VENTOLIN HFA) 108 (90 Base) MCG/ACT inhaler Inhale into the lungs. 06/03/21   [provider]  alprazolam Prudy Feeler) 2 MG tablet TAKE 1 TABLET BY MOUTH FOUR TIMES DAILY 04/08/23   Malva Limes, MD  Azelastine-Fluticasone [phone] MCG/ACT SUSP Place 1 spray into both nostrils 2 (two) times daily.    [provider]  busPIRone (BUSPAR) 15 MG tablet TAKE 1 TABLET(15 MG) BY MOUTH TWICE DAILY 03/07/23   Malva Limes, MD   diltiazem (TIADYLT ER) 120 MG 24 hr capsule TAKE 1 CAPSULE(120 MG) BY MOUTH DAILY 05/04/23   Malva Limes, MD  fexofenadine [facility]) 180 MG tablet Take by mouth. 07/15/21 07/15/22  [provider]  furosemide (LASIX) 20 MG tablet TAKE 1 TABLET(20 MG) BY MOUTH DAILY AS NEEDED FOR FLUID RETENTION 12/30/22   Simmons-Robinson, Tawanna Cooler, MD  hydrochlorothiazide (HYDRODIURIL) 25 MG tablet TAKE 1 TABLET(25 MG) BY MOUTH DAILY 05/06/23   Malva Limes, MD  HYDROcodone-acetaminophen (NORCO) 10-325 MG tablet Take 1 tablet by mouth in the morning, at noon, and at bedtime.  04/17/20   [provider]  ketoconazole (NIZORAL) 2 % cream Apply 1 application topically daily. 07/04/20   Malva Limes, MD  levalbuterol (XOPENEX) 1.25 MG/3ML nebulizer solution levalbuterol 1.25 mg/3 mL solution for nebulization  TAKE 3 MLS BY NEBULIZATION EVERY 6 HOURS AS NEEDED FOR WHEEZING    [provider]  losartan (COZAAR) 100 MG tablet Take 1 tablet by mouth daily. 01/05/22   [provider]  meloxicam (MOBIC) 15 MG tablet TAKE 1 TABLET(15 MG) BY MOUTH DAILY 11/21/19   Malva Limes, MD  montelukast (SINGULAIR) 10 MG tablet Take 1 tablet by mouth daily. 02/18/10   [provider]  Multiple Vitamins-Minerals (CENTRUM SILVER PO) Take 1 tablet by mouth daily. 01/03/09   [provider]  mupirocin ointment (BACTROBAN) 2 % Apply 1 application topically daily. 06/10/20   Erasmo Downer, MD  naloxone [facility]) nasal spray 4 mg/0.1 mL Narcan 4 mg/actuation nasal spray  Take by nasal route every 3 minutes until patient awakes or EMS arrives.    [provider]  OXYGEN Place 3 L into the nose. At all times    [provider]  PROAIR HFA [phone] Base) MCG/ACT inhaler Inhale 1 puff into the lungs every 6 (six) hours as needed.  02/01/16   [provider]  rosuvastatin (CRESTOR) 20 MG tablet Take 1 tablet (20 mg total) by mouth daily. 05/03/22   Malva Limes, MD  tamsulosin (FLOMAX) 0.4 MG CAPS capsule TAKE 1 CAPSULE(0.4 MG) BY MOUTH DAILY 06/21/22   Stoioff, Verna Czech, MD  triamcinolone cream (KENALOG) 0.1 % Apply 1 application topically 2 (two) times daily. 06/18/21   Malva Limes, MD  valACYclovir (VALTREX) 1000 MG tablet valacyclovir 1 gram tablet  TK 1 T PO TID Patient not taking: Reported  on 05/29/2023    [provider]  venlafaxine XR (EFFEXOR-XR) 75 MG 24 hr capsule TAKE 3 CAPSULES(225 MG) BY MOUTH DAILY WITH BREAKFAST 04/21/23   Malva Limes, MD  Scheduled Meds: . heparin  5,000 Units Subcutaneous Q8H  . insulin aspart  0-9 Units Subcutaneous Q4H  . ipratropium-albuterol  3 mL Nebulization Q6H   Continuous Infusions: . 0.9 % NaCl with KCl 40 mEq / L 50 mL/hr at 05/29/23 2314  . lactated ringers Stopped (05/29/23 2316)  . potassium chloride 10 mEq (05/29/23 2315)   PRN Meds:.docusate sodium, ipratropium-albuterol, polyethylene glycol   Active Hospital Problem list   See below  Assessment & Plan:  Altered mental status likely drug-related in a patient with history major depressive disorder with manic episode on Depakote and other medications for anxiety. She report using Alprazolam, illicit drugs (Marijuana) and benadryl. Toxicology positive for cannabis, UA positive for UTI. Depakote levels was low, she thinks her symptoms of hallucinations may be due to Depakote so she stopped taking it. We considered other neurologic etiology such as seizure-related (Ictal, post-ictal, status epilepticus, non-convulsive status epilepticus), stroke, hypertensive encephalopathy, subarachnoid hemorrhage, intracranial hemorrhage, hydrocephalus, demyelinating disease, cerebral edema (may be related to intracranial mass), meningitis. MRI brain was personally reviewed which did not show evidence of above differentials. EEG normal. Reviewed other labs to identify toxic metabolic causes such electrolyte disorders (sodium, calcium, phosphate,  magnesium), Endocrine: Thyroid storm, myxedema coma, pheochromocytoma, hypo- or hyperglycemia, diabetic ketoacidosis, Uremic encephalopathy.   Best practice:  Diet:  {ZOXW:96045} Pain/Anxiety/Delirium protocol (if indicated): {Pain/Anxiety/Delirium:26941} VAP protocol (if indicated): {VAP:29640} DVT prophylaxis: {DVT Prophylaxis:26933} GI prophylaxis: {GI:26934} Glucose control:  {Glucose Control:26935} Central venous access:  {Central Venous Access:26936} Arterial line:  {Central Venous Access:26936} Foley:  {Central Venous Access:26936} Mobility:  {Mobility:26937}  PT consulted: {PT Consult:26938} Last date of multidisciplinary goals of care discussion [***] Code Status:  {Code Status:26939} Disposition: ***   = Goals of Care = Code Status Order: FULL  Primary Emergency Contact: Chrismon,Eddie Wishes to pursue full aggressive treatment and intervention options, including CPR and intubation, but goals of care will be addressed on going with family if that should become necessary.  Critical care time: 45 minutes        Webb Silversmith DNP, CCRN, FNP-C, AGACNP-BC Acute Care & Family Nurse Practitioner [facility] PCCM on call pager [phone]

## 2023-05-29 NOTE — ED Triage Notes (Signed)
Pt states since having his covid shot, a while ago, he has had a lot of anxiety and "dark thoughts." Pt states due to this anxiety, his cartridge is degrading and is having all over body pain. Pt states he is out of pain medications. Pt states diarrhea and burning with urination.   Pt states the dark thoughts are growing, but that he is not suicidal.

## 2023-05-29 NOTE — Sepsis Progress Note (Signed)
Sepsis protocol is being followed by [facility]. 

## 2023-05-29 NOTE — Plan of Care (Signed)

## 2023-05-29 NOTE — ED Notes (Signed)
Patients bed alarm going off. RN into room. Patient at end of bed and pulled IV out. Patient incoherent. Not following commands. EDP called to bedside.

## 2023-05-29 NOTE — Consult Note (Signed)
PHARMACY -  BRIEF ANTIBIOTIC NOTE   Pharmacy has received consult(s) for sepsis from an ED provider.  The patient's profile has been reviewed for ht/wt/allergies/indication/available labs.    One time order(s) placed for vancomycin and cefepime.  Further antibiotics/pharmacy consults should be ordered by admitting physician if indicated.                       Thank you, Rockwell Alexandria, PharmD Clinical Pharmacist 05/29/2023 6:18 PM

## 2023-05-29 NOTE — ED Notes (Signed)
Report called to Nancy, RN in ICU.

## 2023-05-29 NOTE — H&P (Addendum)
NAME:  Clarence Hamilton, MRN:  [id number], DOB:  16-Jun-1956, LOS: 0 ADMISSION DATE:  05/29/2023, CONSULTATION DATE:  05/29/2023 REFERRING MD:  Donna Bernard, CHIEF COMPLAINT:  Altered Mental status    HPI  67 y.o male with significant PMH of Mild persistent Asthma/COPD On oxygen at 3 liters. recurrent flares, intolerant to steroids, HTN, HLD, Prediabetes,  Lumbar radiculopathy, chronic pain syndrome,anxiety and depression who presented to the ED with chief complaints of altered mental status.  Patient presented with c/o increasing anxiety and "dark thoughts". He report that he is ran out of his pain medication and other prescriptions for his depression. He is not a very good historian and is unclear what medications he is currently taking. He is prescribed Effexor which appeared to have been refilled on 04/21/23. He also takes Alprazolam and Buspar. Patient report that he takes medication as prescribed but unclear which medication he took today. He is also reporting diarrhea and burning with urination.   ED Course: Initial vital signs showed HR of beats/minute, BP mm Hg, the RR 30 breaths/minute, and the oxygen saturation % on and a temperature of 98.24F (36.9C). Patient was lethargic, moaned intermittently, and responded with one-word answers; symmetric movement in the arms and legs was observed. Pertinent Labs/Diagnostics Findings: Na+/ K+125/2.0  Glucose: 147 BUN/Cr.:30/1.91 AST/ALT:48/73 WBC:19.6 Hgb/Hct: 11.1/30.2 Plts: 725 Lactic acid: 2.5 PCT <0.10 COVID PCR: Negative,  UDS+ Opiate, Benzo and cannabinoid UA+ UTI VBG: pO2 pend; pCO2 34; pH 7.54;  HCO3 29.1, %O2 Sat pend.  CXR> CTH> Negative CTA Chest> No acute finding CT Abd/pelvis>No acute finding  Patient given 30 cc/kg of fluids and started on broad-spectrum antibiotics Vancomycin, cefepime and Flagyl for suspected sepsis secondary to suspected UTI. Patient remained altered and restless requiring benzos, Haldol and sitter at the  bedside for safety. Due to persistent altered mental status, PCCM consulted.  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 recurrent flares, intolerant to steroids, HTN, HLD, Prediabetes,  Lumbar radiculopathy, chronic pain syndrome,anxiety and depression  Significant Hospital Events   8/4: Admitted ICU with altered mental status  Consults:  Psychiatric  Procedures:  None  Significant Diagnostic Tests:  8/4: Chest Xray>IMPRESSION: Low volumes.  No active cardiopulmonary disease.  8/4: Noncontrast CT head> IMPRESSION: No acute intracranial abnormality.  8/4: CTA Chest, abdomen and pelvis>IMPRESSION: 1. Examination is generally limited by patient and breath motion artifact throughout. 2. Within this limitation, no acute CT findings of the chest, abdomen, or pelvis to explain sepsis. 3. Tracheobronchomalacia. Diffuse bilateral bronchial wall thickening. Mosaic attenuation of the airspaces, suggestive of small airways disease. 4. Coronary artery disease. 5. Prostatomegaly.  Interim History / Subjective:    -  Micro Data:  8/4: SARS-CoV-2 PCR> negative 8/4: Influenza PCR> negative 8/4: Blood culture x2> 8/4: Urine Culture> 8/4: MRSA PCR>>   Antimicrobials:  Vancomycin 05/29/23 x 1 Cefepime 05/29/23 x 1 Ceftriaxone 05/29/23 >>   OBJECTIVE  Blood pressure 127/78, pulse 89, temperature 99.9 F (37.7 C), temperature source Oral, resp. rate (!) 23, height 5\' 8"  (1.727 m), weight 72.6 kg, SpO2 100%.       No intake or output data in the 24 hours ending 05/29/23 2204 Filed Weights   05/29/23 1614  Weight: 72.6 kg   Physical Examination  GENERAL: 67 year-old critically ill patient lying in the bed in no acute distress EYES: PEERLA. No scleral icterus. Extraocular muscles intact.  HEENT: Head atraumatic, normocephalic. Oropharynx and nasopharynx clear.  NECK:  No JVD,  supple  LUNGS: Normal breath sounds bilaterally.  No use of accessory muscles  of respiration.  CARDIOVASCULAR: S1, S2 normal. No murmurs, rubs, or gallops.  ABDOMEN: Soft, NTND EXTREMITIES: No swelling or erythema.  Capillary refill > 3 seconds in all extremities. Pulses palpable distally. NEUROLOGIC: The patient is  alert and oriented x 3 with intermittent consfusion. No new or focal neurological deficit appreciated. Cranial nerves are intact.  SKIN: No obvious rash, lesion, or ulcer. Warm to touch Labs/imaging that I havepersonally reviewed  (right click and "Reselect all SmartList Selections" daily)     Labs   CBC: Recent Labs  Lab 05/29/23 1618  WBC 19.6*  HGB 11.1*  HCT 30.2*  MCV 82.5  PLT 725*    Basic Metabolic Panel: Recent Labs  Lab 05/29/23 1618 05/29/23 1926  NA 125* 128*  K <2.0* <2.0*  CL 81* 87*  CO2 27 25  GLUCOSE 147* 89  BUN 30* 29*  CREATININE 1.91* 1.79*  CALCIUM 8.5* 7.7*   GFR: Estimated Creatinine Clearance: 39.8 mL/min (A) (by C-G formula based on SCr of 1.79 mg/dL (H)). Recent Labs  Lab 05/29/23 1618 05/29/23 1816 05/29/23 1941  WBC 19.6*  --   --   LATICACIDVEN  --  2.5* 2.2*    Liver Function Tests: Recent Labs  Lab 05/29/23 1618  AST 48*  ALT 73*  ALKPHOS 101  BILITOT 0.7  PROT 7.3  ALBUMIN 3.5   No results for input(s): "LIPASE", "AMYLASE" in the last 168 hours. Recent Labs  Lab 05/29/23 1816  AMMONIA 14    ABG    Component Value Date/Time   HCO3 29.1 (H) 05/29/2023 2000   O2SAT PENDING 05/29/2023 2000     Coagulation Profile: Recent Labs  Lab 05/29/23 1816  INR 1.1    Cardiac Enzymes: No results for input(s): "CKTOTAL", "CKMB", "CKMBINDEX", "TROPONINI" in the last 168 hours.  HbA1C: Hgb A1c MFr Bld  Date/Time Value Ref Range Status  02/26/2022 04:06 PM 5.9 (H) 4.8 - 5.6 % Final    Comment:             Prediabetes: 5.7 - 6.4          Diabetes: >6.4          Glycemic control for adults with diabetes: <7.0   08/25/2021 03:40 PM 6.1 (H) 4.8 - 5.6 % Final    Comment:              Prediabetes: 5.7 - 6.4          Diabetes: >6.4          Glycemic control for adults with diabetes: <7.0     CBG: No results for input(s): "GLUCAP" in the last 168 hours.  Review of Systems:   Unable to be obtained secondary to the patient's altered mental status status.    Past Medical History  He,  has a past medical history of Anxiety, Arthritis, Asthma, History of chicken pox, Hyperglycemia, Hyperlipidemia, Hypertension, and Obesity.   Surgical History    Past Surgical History:  Procedure Laterality Date   HERNIA REPAIR  1970's   Inguinal hernia repair x 2    TONSILLECTOMY AND ADENOIDECTOMY  1960's     Social History   reports that he has never smoked. He has never used smokeless tobacco. He reports that he does not drink alcohol and does not use drugs.   Family History   His family history includes Breast cancer in his mother; Hypertension in his  father and mother; Melanoma in his father; Parkinson's disease in his father; Throat cancer in his paternal grandmother.   Allergies Allergies  Allergen Reactions   Pollen Extract Cough   Sulfa Antibiotics    Tetracycline     Headache   Tramadol     insomnia   Corticosteroids     Other reaction(s): Muscle Pain     Home Medications  Prior to Admission medications   Medication Sig Start Date End Date Taking? Authorizing Provider  albuterol (PROVENTIL) (2.5 MG/3ML) 0.083% nebulizer solution SMARTSIG:3 Milliliter(s) Via Nebulizer Every 6 Hours PRN 10/08/21   [provider]  albuterol (VENTOLIN HFA) 108 (90 Base) MCG/ACT inhaler Inhale into the lungs. 06/03/21   [provider]  alprazolam Prudy Feeler) 2 MG tablet TAKE 1 TABLET BY MOUTH FOUR TIMES DAILY 04/08/23   Malva Limes, MD  Azelastine-Fluticasone [phone] MCG/ACT SUSP Place 1 spray into both nostrils 2 (two) times daily.    [provider]  busPIRone (BUSPAR) 15 MG tablet TAKE 1 TABLET(15 MG) BY MOUTH TWICE DAILY 03/07/23   Malva Limes,  MD  diltiazem (TIADYLT ER) 120 MG 24 hr capsule TAKE 1 CAPSULE(120 MG) BY MOUTH DAILY 05/04/23   Malva Limes, MD  fexofenadine [facility]) 180 MG tablet Take by mouth. 07/15/21 07/15/22  [provider]  furosemide (LASIX) 20 MG tablet TAKE 1 TABLET(20 MG) BY MOUTH DAILY AS NEEDED FOR FLUID RETENTION 12/30/22   Simmons-Robinson, Tawanna Cooler, MD  hydrochlorothiazide (HYDRODIURIL) 25 MG tablet TAKE 1 TABLET(25 MG) BY MOUTH DAILY 05/06/23   Malva Limes, MD  HYDROcodone-acetaminophen (NORCO) 10-325 MG tablet Take 1 tablet by mouth in the morning, at noon, and at bedtime.  04/17/20   [provider]  ketoconazole (NIZORAL) 2 % cream Apply 1 application topically daily. 07/04/20   Malva Limes, MD  levalbuterol (XOPENEX) 1.25 MG/3ML nebulizer solution levalbuterol 1.25 mg/3 mL solution for nebulization  TAKE 3 MLS BY NEBULIZATION EVERY 6 HOURS AS NEEDED FOR WHEEZING    [provider]  losartan (COZAAR) 100 MG tablet Take 1 tablet by mouth daily. 01/05/22   [provider]  meloxicam (MOBIC) 15 MG tablet TAKE 1 TABLET(15 MG) BY MOUTH DAILY 11/21/19   Malva Limes, MD  montelukast (SINGULAIR) 10 MG tablet Take 1 tablet by mouth daily. 02/18/10   [provider]  Multiple Vitamins-Minerals (CENTRUM SILVER PO) Take 1 tablet by mouth daily. 01/03/09   [provider]  mupirocin ointment (BACTROBAN) 2 % Apply 1 application topically daily. 06/10/20   Erasmo Downer, MD  naloxone [facility]) nasal spray 4 mg/0.1 mL Narcan 4 mg/actuation nasal spray  Take by nasal route every 3 minutes until patient awakes or EMS arrives.    [provider]  OXYGEN Place 3 L into the nose. At all times    [provider]  PROAIR HFA 108 [phone] Base) MCG/ACT inhaler Inhale 1 puff into the lungs every 6 (six) hours as needed.  02/01/16   [provider]  rosuvastatin (CRESTOR) 20 MG tablet Take 1 tablet (20 mg total) by mouth daily. 05/03/22   Malva Limes, MD  tamsulosin (FLOMAX) 0.4 MG CAPS capsule TAKE 1 CAPSULE(0.4 MG) BY MOUTH DAILY 06/21/22   Stoioff, Verna Czech, MD  triamcinolone cream (KENALOG) 0.1 % Apply 1 application topically 2 (two) times daily. 06/18/21   Malva Limes, MD  valACYclovir (VALTREX) 1000 MG tablet valacyclovir 1 gram tablet  TK 1 T PO TID Patient not  taking: Reported on 05/29/2023    [provider]  venlafaxine XR (EFFEXOR-XR) 75 MG 24 hr capsule TAKE 3 CAPSULES(225 MG) BY MOUTH DAILY WITH BREAKFAST 04/21/23   Malva Limes, MD  Scheduled Meds:  heparin  5,000 Units Subcutaneous Q8H   insulin aspart  0-9 Units Subcutaneous Q4H   ipratropium-albuterol  3 mL Nebulization Q6H   Continuous Infusions:  0.9 % NaCl with KCl 40 mEq / L 50 mL/hr at 05/29/23 2314   lactated ringers Stopped (05/29/23 2316)   potassium chloride 10 mEq (05/29/23 2315)   PRN Meds:.docusate sodium, ipratropium-albuterol, polyethylene glycol   Active Hospital Problem list   See below  Assessment & Plan:  #Sepsis  in the setting of Suspected UTI  Initial interventions/workup included: 30cc/kg of NS/LR & Cefepime/ Vancomycin/Flagyl -F/u cultures, trend lactic/ PCT -Monitor WBC/ fever curve -Start ceftriaxone -IVF hydration as needed -Pressors for MAP goal >65 -Strict I/O's    #Acute Toxic Metabolic Encephalopathy Likely in the setting of drug toxicity and possible sepsis UDS+ Opiates, Benzo and Cannabinoid, he has hx of chronic pain syndrome, anxiety and depression on Alprazolam, Buspar, Effexor and hydrocodone. Patient admitted that he ran out of his meds and took "some meds" from his friend, it is unknown what or how much he took -EKG shows prolonged QTC, he has AKI with electrolyte derangement hyponatremia and severe hypokalemia concerning for SSRI/SNRI toxicity? -CTH negative -CT Chest abd/pelvis negative -Seizure precautions -Check CK,-*Check TSH, Free T4 -Sodium Bicarb PRN for widened QRS -Supportive care with  IVFs -Hold home meds for now -Avoid sedatives as able   #AKI likely ATN in the setting of above #Hypokalemia #Hyponatremia #Non anion gap metabolic acidosis with Lactic Acidosis -trend Lactate -Monitor I&O's / urinary output -Follow serial BMP -Ensure adequate renal perfusion -Avoid nephrotoxic agents as able -Replace electrolytes as indicated   #Hyperglycemia -CBG's q4; Target range of 140 to 180 -SSI -Follow ICU Hypo/Hyperglycemia protocol   #HTN #HLD -Continue Crestor -Hold home losartan, hydrochlorothiazide, diltiazem and Lasix in the setting of AKI and hypotension   #Chronic Pain #Lumbar Radiculopathy  -Hold home meds due to altered mental status and suspected drug intoxication   #Anxiety and Depression -Hold home meds for now -Psych consult, pt now with hallucinations, "dark thoughts" denies suicide ideation, concerns for SSRI/SNRI toxicity although he denies taking his Effexor recently?  #Chronic Asthma/COPD with exacerbation -Supplemental O2 as needed to maintain O2 saturations 88 to 92% -As needed and scheduled bronchodilators -Continue Singulair    Best practice:  Diet:  Oral Pain/Anxiety/Delirium protocol (if indicated): No VAP protocol (if indicated): Not indicated DVT prophylaxis: Subcutaneous Heparin GI prophylaxis: PPI Glucose control:  SSI Yes Central venous access:  N/A Arterial line:  N/A Foley:  N/A Mobility:  bed rest  PT consulted: N/A Last date of multidisciplinary goals of care discussion [05/28/13] Code Status:  full code Disposition: ICU   = Goals of Care = Code Status Order: FULL  Primary Emergency Contact: Chrismon,Eddie Wishes to pursue full aggressive treatment and intervention options, including CPR and intubation, but goals of care will be addressed on going with family if that should become necessary.  Critical care time: 45 minutes        Webb Silversmith DNP, CCRN, FNP-C, AGACNP-BC Acute Care & Family Nurse  Practitioner [facility] PCCM on call pager [phone]

## 2023-05-29 NOTE — Progress Notes (Signed)
PHARMACY CONSULT NOTE - FOLLOW UP  Pharmacy Consult for Electrolyte Monitoring and Replacement   Recent Labs: Potassium (mmol/L)  Date Value  05/29/2023 <2.0 (LL)   Calcium (mg/dL)  Date Value  33/29/5188 7.7 (L)   Albumin (g/dL)  Date Value  41/66/0630 3.5  02/26/2022 4.6   Sodium (mmol/L)  Date Value  05/29/2023 128 (L)  02/26/2022 136     Assessment: 8/4 @ 1926 :  K = < 2.0,  Corrected Ca = 8.1   Goal of Therapy:  Electrolytes WNL   Plan:  KCl 10 mEq IV X 6 ordered to start on 8/4 @ 1833, 2 bags given.   - Will ordered Calcium gluconate 1 gm IVPB X 1.  Will recheck electrolytes on 8/5 @ 0300.   Scherrie Gerlach ,PharmD Clinical Pharmacist 05/29/2023 9:41 PM

## 2023-05-29 NOTE — ED Notes (Signed)
Patient Belongings:  1 black and red shirt 1 pair black pants 1 pair black/yellow shoes 1 yellow watch (in black bag) 1 black bag with various belongings 1 black pair socks 2 grey knee supporters 1 brown belt 1 pair blue underwear Nose drops   Pt states he is on 3L of oxygen at home, but does not know why. Pt informed his oxygen level was 99% on room air.

## 2023-05-29 NOTE — ED Notes (Signed)
Report was attempted but nurse was unavailable.

## 2023-05-29 NOTE — ED Provider Notes (Signed)
[facility] Provider Note   Event Date/Time   First MD Initiated Contact with Patient 05/29/23 1803     (approximate) History  Anxiety and Behavioral Concerns  HPI Clarence Hamilton is a 67 y.o. male with unknown past medical history who presents for altered mental status.  Per triage patient has been complaining of "dark thoughts".  Patient is disheveled, and with incomprehensible speech at this time. ROS: Unable to assess   Physical Exam  Triage Vital Signs: ED Triage Vitals  Encounter Vitals Group     BP 05/29/23 1613 126/64     Systolic BP Percentile --      Diastolic BP Percentile --      Pulse Rate 05/29/23 1613 92     Resp 05/29/23 1613 20     Temp 05/29/23 1613 98.5 F (36.9 C)     Temp src --      SpO2 05/29/23 1613 99 %     Weight 05/29/23 1614 160 lb (72.6 kg)     Height 05/29/23 1614 5\' 8"  (1.727 m)     Head Circumference --      Peak Flow --      Pain Score 05/29/23 1614 7     Pain Loc --      Pain Education --      Exclude from Growth Chart --    Most recent vital signs: Vitals:   05/29/23 2243 05/29/23 2300  BP: (!) 141/77 134/74  Pulse: 83 84  Resp: 16 18  Temp: 98.2 F (36.8 C)   SpO2: 100% 98%   General: Awake, disoriented, tearful, confused speech CV:  Good peripheral perfusion.  Resp:  Increased effort.  Abd:  No distention.  Other:  Elderly well-developed, well-nourished Caucasian male laying in bed in no acute distress.  Emotionally labile ED Results / Procedures / Treatments  Labs (all labs ordered are listed, but only abnormal results are displayed) Labs Reviewed  COMPREHENSIVE METABOLIC PANEL - Abnormal; Notable for the following components:      Result Value   Sodium 125 (*)    Potassium <2.0 (*)    Chloride 81 (*)    Glucose, Bld 147 (*)    BUN 30 (*)    Creatinine, Ser 1.91 (*)    Calcium 8.5 (*)    AST 48 (*)    ALT 73 (*)    GFR, Estimated 38 (*)    Anion gap 17 (*)    All other components  within normal limits  SALICYLATE LEVEL - Abnormal; Notable for the following components:   Salicylate Lvl <7.0 (*)    All other components within normal limits  ACETAMINOPHEN LEVEL - Abnormal; Notable for the following components:   Acetaminophen (Tylenol), Serum <10 (*)    All other components within normal limits  CBC - Abnormal; Notable for the following components:   WBC 19.6 (*)    RBC 3.66 (*)    Hemoglobin 11.1 (*)    HCT 30.2 (*)    MCHC 36.8 (*)    Platelets 725 (*)    All other components within normal limits  URINE DRUG SCREEN, QUALITATIVE ([facility] ONLY) - Abnormal; Notable for the following components:   Opiate, Ur Screen POSITIVE (*)    Cannabinoid 50 Ng, Ur [place] POSITIVE (*)    Benzodiazepine, Ur Scrn POSITIVE (*)    All other components within normal limits  LACTIC ACID, PLASMA - Abnormal; Notable for the following components:   Lactic Acid,  Venous 2.5 (*)    All other components within normal limits  LACTIC ACID, PLASMA - Abnormal; Notable for the following components:   Lactic Acid, Venous 2.2 (*)    All other components within normal limits  URINALYSIS, W/ REFLEX TO CULTURE (INFECTION SUSPECTED) - Abnormal; Notable for the following components:   Color, Urine YELLOW (*)    APPearance CLOUDY (*)    Hgb urine dipstick SMALL (*)    Nitrite POSITIVE (*)    Leukocytes,Ua LARGE (*)    Bacteria, UA RARE (*)    All other components within normal limits  BLOOD GAS, VENOUS - Abnormal; Notable for the following components:   pH, Ven 7.54 (*)    pCO2, Ven 34 (*)    Bicarbonate 29.1 (*)    Acid-Base Excess 6.6 (*)    All other components within normal limits  BASIC METABOLIC PANEL - Abnormal; Notable for the following components:   Sodium 128 (*)    Potassium <2.0 (*)    Chloride 87 (*)    BUN 29 (*)    Creatinine, Ser 1.79 (*)    Calcium 7.7 (*)    GFR, Estimated 42 (*)    Anion gap 16 (*)    All other components within normal limits  T4, FREE - Abnormal; Notable  for the following components:   Free T4 1.17 (*)    All other components within normal limits  RESP PANEL BY RT-PCR (RSV, FLU A&B, COVID)  [id number]  CULTURE, BLOOD (ROUTINE X 2)  CULTURE, BLOOD (ROUTINE X 2)  URINE CULTURE  ETHANOL  PROTIME-INR  APTT  AMMONIA  TSH  PROCALCITONIN  GLUCOSE, CAPILLARY  CBC  MAGNESIUM  PHOSPHORUS  COMPREHENSIVE METABOLIC PANEL  HEMOGLOBIN A1C  HIV ANTIBODY (ROUTINE TESTING W REFLEX)   EKG ED ECG REPORT I, Merwyn Katos, the attending physician, personally viewed and interpreted this ECG. Date: 05/29/2023 EKG Time: 1810 Rate: 85 Rhythm: normal sinus rhythm QRS Axis: normal Intervals: normal ST/T Wave abnormalities: normal Narrative Interpretation: no evidence of acute ischemia RADIOLOGY ED MD interpretation: CT of the chest abdomen and pelvis with IV contrast shows tracheobronchomalacia with diffuse bilateral bronchial wall thickening  One-view portable chest x-ray interpreted by me shows no evidence of acute abnormalities including no pneumonia, pneumothorax, or widened mediastinum  CT of the head without contrast interpreted by me shows no evidence of acute abnormalities including no intracerebral hemorrhage, obvious masses, or significant edema -Agree with radiology assessment Official radiology report(s): CT CHEST ABDOMEN PELVIS W CONTRAST  Result Date: 05/29/2023 CLINICAL DATA:  Sepsis EXAM: CT CHEST, ABDOMEN, AND PELVIS WITH CONTRAST TECHNIQUE: Multidetector CT imaging of the chest, abdomen and pelvis was performed following the standard protocol during bolus administration of intravenous contrast. RADIATION DOSE REDUCTION: This exam was performed according to the departmental dose-optimization program which includes automated exposure control, adjustment of the mA and/or kV according to patient size and/or use of iterative reconstruction technique. CONTRAST:  80mL OMNIPAQUE IOHEXOL 300 MG/ML  SOLN COMPARISON:  None Available. FINDINGS:  Examination is generally limited by patient and breath motion artifact throughout. CT CHEST FINDINGS Cardiovascular: Aortic atherosclerosis. Normal heart size. Three-vessel coronary artery calcifications. No pericardial effusion. Mediastinum/Nodes: No enlarged mediastinal, hilar, or axillary lymph nodes. Small hiatal hernia. Thyroid gland and esophagus demonstrate no significant findings. Lungs/Pleura: Tracheobronchomalacia. Diffuse bilateral bronchial wall thickening. Mosaic attenuation of the airspaces. No pleural effusion or pneumothorax. Musculoskeletal: No chest wall abnormality. No acute osseous findings. CT ABDOMEN PELVIS FINDINGS Hepatobiliary: No solid liver  abnormality is seen. No gallstones, gallbladder wall thickening, or biliary dilatation. Pancreas: Unremarkable. No pancreatic ductal dilatation or surrounding inflammatory changes. Spleen: Normal in size without significant abnormality. Adrenals/Urinary Tract: Adrenal glands are unremarkable. Kidneys are normal, without renal calculi, solid lesion, or hydronephrosis. Bladder is unremarkable. Stomach/Bowel: Stomach is within normal limits. Appendix appears normal. No evidence of bowel wall thickening, distention, or inflammatory changes. Vascular/Lymphatic: Aortic atherosclerosis. No enlarged abdominal or pelvic lymph nodes. Reproductive: Prostatomegaly. Other: Small fat containing umbilical hernia. Small fat containing bilateral inguinal hernias. No ascites. Musculoskeletal: No acute osseous findings. IMPRESSION: 1. Examination is generally limited by patient and breath motion artifact throughout. 2. Within this limitation, no acute CT findings of the chest, abdomen, or pelvis to explain sepsis. 3. Tracheobronchomalacia. Diffuse bilateral bronchial wall thickening. Mosaic attenuation of the airspaces, suggestive of small airways disease. 4. Coronary artery disease. 5. Prostatomegaly. Aortic Atherosclerosis (ICD10-I70.0). Electronically Signed   By: Jearld Lesch M.D.   On: 05/29/2023 20:32   DG Chest Port 1 View  Result Date: 05/29/2023 CLINICAL DATA:  Questionable sepsis - evaluate for abnormality EXAM: PORTABLE CHEST 1 VIEW COMPARISON:  None Available. FINDINGS: Low lung volumes. Heart and mediastinal contours are within normal limits. No focal opacities or effusions. No acute bony abnormality. IMPRESSION: Low volumes.  No active cardiopulmonary disease. Electronically Signed   By: Charlett Nose M.D.   On: 05/29/2023 18:37   CT Head Wo Contrast  Result Date: 05/29/2023 CLINICAL DATA:  Mental status change, unknown cause EXAM: CT HEAD WITHOUT CONTRAST TECHNIQUE: Contiguous axial images were obtained from the base of the skull through the vertex without intravenous contrast. RADIATION DOSE REDUCTION: This exam was performed according to the departmental dose-optimization program which includes automated exposure control, adjustment of the mA and/or kV according to patient size and/or use of iterative reconstruction technique. COMPARISON:  None Available. FINDINGS: Brain: No acute intracranial abnormality. Specifically, no hemorrhage, hydrocephalus, mass lesion, acute infarction, or significant intracranial injury. Vascular: No hyperdense vessel or unexpected calcification. Skull: No acute calvarial abnormality. Sinuses/Orbits: No acute findings Other: None IMPRESSION: No acute intracranial abnormality. Electronically Signed   By: Charlett Nose M.D.   On: 05/29/2023 18:36   PROCEDURES: Critical Care performed: Yes, see critical care procedure note(s) .1-3 Lead EKG Interpretation  Performed by: Merwyn Katos, MD Authorized by: Merwyn Katos, MD     Interpretation: normal     ECG rate:  71   ECG rate assessment: normal     Rhythm: sinus rhythm     Ectopy: none     Conduction: normal   CRITICAL CARE Performed by: Merwyn Katos  Total critical care time: 33 minutes  Critical care time was exclusive of separately billable procedures and  treating other patients.  Critical care was necessary to treat or prevent imminent or life-threatening deterioration.  Critical care was time spent personally by me on the following activities: development of treatment plan with patient and/or surrogate as well as nursing, discussions with consultants, evaluation of patient's response to treatment, examination of patient, obtaining history from patient or surrogate, ordering and performing treatments and interventions, ordering and review of laboratory studies, ordering and review of radiographic studies, pulse oximetry and re-evaluation of patient's condition.  MEDICATIONS ORDERED IN ED: Medications  lactated ringers infusion (0 mLs Intravenous Stopped 05/29/23 2316)  potassium chloride 10 mEq in 100 mL IVPB (10 mEq Intravenous New Bag/Given 05/29/23 2315)  docusate sodium (COLACE) capsule 100 mg (has no administration in time range)  polyethylene glycol (MIRALAX / GLYCOLAX) packet 17 g (has no administration in time range)  insulin aspart (novoLOG) injection 0-9 Units ( Subcutaneous Not Given 05/29/23 2315)  heparin injection 5,000 Units (5,000 Units Subcutaneous Given 05/29/23 2314)  0.9 % NaCl with KCl 40 mEq / L  infusion ( Intravenous New Bag/Given 05/29/23 2314)  ipratropium-albuterol (DUONEB) 0.5-2.5 (3) MG/3ML nebulizer solution 3 mL (3 mLs Nebulization Not Given 05/29/23 2308)  ipratropium-albuterol (DUONEB) 0.5-2.5 (3) MG/3ML nebulizer solution 3 mL (has no administration in time range)  sodium chloride 0.9 % bolus 1,000 mL (0 mLs Intravenous Stopped 05/29/23 1951)    And  sodium chloride 0.9 % bolus 1,000 mL (0 mLs Intravenous Stopped 05/29/23 1951)    And  sodium chloride 0.9 % bolus 250 mL (0 mLs Intravenous Stopped 05/29/23 2045)  ceFEPIme (MAXIPIME) 2 g in sodium chloride 0.9 % 100 mL IVPB (0 g Intravenous Stopped 05/29/23 1940)  metroNIDAZOLE (FLAGYL) IVPB 500 mg (0 mg Intravenous Stopped 05/29/23 1940)  vancomycin (VANCOREADY) IVPB 1500 mg/300 mL  (0 mg Intravenous Stopped 05/29/23 2204)  haloperidol lactate (HALDOL) injection 5 mg (5 mg Intravenous Given 05/29/23 1920)  LORazepam (ATIVAN) injection 2 mg (2 mg Intravenous Given 05/29/23 1921)  iohexol (OMNIPAQUE) 300 MG/ML solution 80 mL (80 mLs Intravenous Contrast Given 05/29/23 2020)  diphenhydrAMINE (BENADRYL) injection 25 mg (25 mg Intravenous Given 05/29/23 2010)  diphenhydrAMINE (BENADRYL) injection 25 mg (25 mg Intravenous Given 05/29/23 2136)  sodium bicarbonate injection 100 mEq (100 mEq Intravenous Given 05/29/23 2237)   IMPRESSION / MDM / ASSESSMENT AND PLAN / ED COURSE  I reviewed the triage vital signs and the nursing notes.                             The patient is on the cardiac monitor to evaluate for evidence of arrhythmia and/or significant heart rate changes. Patient's presentation is most consistent with acute presentation with potential threat to life or bodily function.  This patient presents to the ED for concern of altered mental status, this involves an extensive number of treatment options, and is a complaint that carries with it a high risk of complications and morbidity.  The differential diagnosis includes sepsis, intracranial hemorrhage, alcohol withdrawal, medication overdose Co morbidities that complicate the patient evaluation  Hyperlipidemia, obesity, hypertension, anxiety Additional history obtained:  Additional history obtained from EMS and EMR  External records from outside source obtained and reviewed including office visit on 02/02/2023 at [facility] Lab Tests:  I Ordered, and personally interpreted labs.  The pertinent results include: Lactate 2.4, potassium less than 2, sodium 128, creatinine 1.79, urinalysis showing nitrates, leukocytes, and bacteria concerning for UTI.  Patient's free T4 elevated at 1.17.  Patient's pH 7.54.  Patient's UDS positive for opiates, cannabinoids, and benzodiazepines Imaging Studies ordered:  I ordered imaging studies  including head CT, CT chest abdomen pelvis that did not show any evidence of acute abnormalities for patient's altered mental status  I agree with the radiologist interpretation Cardiac Monitoring: / EKG:  The patient was maintained on a cardiac monitor.  I personally viewed and interpreted the cardiac monitored which showed an underlying rhythm of: Normal sinus rhythm Consultations Obtained:  I requested consultation with the ICU,  and discussed lab and imaging findings as well as pertinent plan - they recommend: Admission Problem List / ED Course / Critical interventions / Medication management  Hyponatremia, hypokalemia, urinary tract infection, altered mental status  I  ordered medication including IV normal saline, empiric antibiotics, potassium for hyponatremia, hypokalemia, and sepsis  Reevaluation of the patient after these medicines showed that the patient improved  I have reviewed the patients home medicines and have made adjustments as needed  Dispo: Admit to the ICU       FINAL CLINICAL IMPRESSION(S) / ED DIAGNOSES   Final diagnoses:  Delirium  Hyponatremia  Hypokalemia   Rx / DC Orders   ED Discharge Orders     None      Note:  This document was prepared using Dragon voice recognition software and may include unintentional dictation errors.   Merwyn Katos, MD 05/29/23 [phone]

## 2023-05-30 DIAGNOSIS — R410 Disorientation, unspecified: Secondary | ICD-10-CM | POA: Diagnosis not present

## 2023-05-30 DIAGNOSIS — E876 Hypokalemia: Secondary | ICD-10-CM | POA: Diagnosis not present

## 2023-05-30 LAB — MAGNESIUM: Magnesium: 2.8 mg/dL — ABNORMAL HIGH (ref 1.7–2.4)

## 2023-05-30 LAB — BASIC METABOLIC PANEL
Anion gap: 14 (ref 5–15)
Anion gap: 11 (ref 5–15)
Anion gap: 10 (ref 5–15)
BUN: 23 mg/dL (ref 8–23)
BUN: 22 mg/dL (ref 8–23)
BUN: 21 mg/dL (ref 8–23)
CO2: 23 mmol/L (ref 22–32)
CO2: 26 mmol/L (ref 22–32)
CO2: 26 mmol/L (ref 22–32)
Calcium: 7.8 mg/dL — ABNORMAL LOW (ref 8.9–10.3)
Calcium: 7.7 mg/dL — ABNORMAL LOW (ref 8.9–10.3)
Calcium: 7.6 mg/dL — ABNORMAL LOW (ref 8.9–10.3)
Chloride: 93 mmol/L — ABNORMAL LOW (ref 98–111)
Chloride: 93 mmol/L — ABNORMAL LOW (ref 98–111)
Chloride: 93 mmol/L — ABNORMAL LOW (ref 98–111)
Creatinine, Ser: 1.71 mg/dL — ABNORMAL HIGH (ref 0.61–1.24)
Creatinine, Ser: 1.51 mg/dL — ABNORMAL HIGH (ref 0.61–1.24)
Creatinine, Ser: 1.46 mg/dL — ABNORMAL HIGH (ref 0.61–1.24)
GFR, Estimated: 44 mL/min — ABNORMAL LOW (ref >60)
GFR, Estimated: 51 mL/min — ABNORMAL LOW (ref >60)
GFR, Estimated: 53 mL/min — ABNORMAL LOW (ref >60)
Glucose, Bld: 128 mg/dL — ABNORMAL HIGH (ref 70–99)
Glucose, Bld: 113 mg/dL — ABNORMAL HIGH (ref 70–99)
Glucose, Bld: 118 mg/dL — ABNORMAL HIGH (ref 70–99)
Potassium: <2 mmol/L — CL (ref 3.5–5.1)
Potassium: 2.9 mmol/L — ABNORMAL LOW (ref 3.5–5.1)
Potassium: 2.3 mmol/L — CL (ref 3.5–5.1)
Sodium: 130 mmol/L — ABNORMAL LOW (ref 135–145)
Sodium: 130 mmol/L — ABNORMAL LOW (ref 135–145)
Sodium: 129 mmol/L — ABNORMAL LOW (ref 135–145)

## 2023-05-30 LAB — GLUCOSE, CAPILLARY
Glucose-Capillary: 119 mg/dL — ABNORMAL HIGH (ref 70–99)
Glucose-Capillary: 123 mg/dL — ABNORMAL HIGH (ref 70–99)
Glucose-Capillary: 105 mg/dL — ABNORMAL HIGH (ref 70–99)
Glucose-Capillary: 107 mg/dL — ABNORMAL HIGH (ref 70–99)
Glucose-Capillary: 100 mg/dL — ABNORMAL HIGH (ref 70–99)

## 2023-05-30 LAB — HIV ANTIBODY (ROUTINE TESTING W REFLEX): HIV Screen 4th Generation wRfx: NONREACTIVE

## 2023-05-30 LAB — HEMOGLOBIN A1C
Hgb A1c MFr Bld: 5.6 % (ref 4.8–5.6)
Mean Plasma Glucose: 114.02 mg/dL

## 2023-05-30 LAB — CK: Total CK: 74 U/L (ref 49–397)

## 2023-05-30 MED ORDER — K PHOS MONO-SOD PHOS DI & MONO 155-852-130 MG PO TABS
500.0000 mg | ORAL_TABLET | Freq: Three times a day (TID) | ORAL | Status: DC
  Filled 2023-05-30: qty 2

## 2023-05-30 MED ORDER — POTASSIUM CHLORIDE 10 MEQ/100ML IV SOLN
10.0000 meq | INTRAVENOUS | Status: AC
  Administered 2023-05-30 (×3): 10 meq via INTRAVENOUS
  Filled 2023-05-30 (×4): qty 100

## 2023-05-30 MED ORDER — POTASSIUM CHLORIDE 10 MEQ/100ML IV SOLN
10.0000 meq | INTRAVENOUS | Status: AC
  Administered 2023-05-31 (×6): 10 meq via INTRAVENOUS
  Filled 2023-05-30 (×10): qty 100

## 2023-05-30 MED ORDER — CHLORHEXIDINE GLUCONATE CLOTH 2 % EX PADS
6.0000 | MEDICATED_PAD | Freq: Every day | CUTANEOUS | Status: DC
  Administered 2023-05-30 – 2023-06-01 (×3): 6 via TOPICAL

## 2023-05-30 MED ORDER — POTASSIUM CHLORIDE 10 MEQ/100ML IV SOLN
10.0000 meq | INTRAVENOUS | Status: AC
  Administered 2023-05-30 (×5): 10 meq via INTRAVENOUS
  Filled 2023-05-30 (×6): qty 100

## 2023-05-30 MED ORDER — MAGNESIUM SULFATE 2 GM/50ML IV SOLN
2.0000 g | Freq: Once | INTRAVENOUS | Status: AC
  Administered 2023-05-30: 2 g via INTRAVENOUS
  Filled 2023-05-30: qty 50

## 2023-05-30 MED ORDER — ENOXAPARIN SODIUM 40 MG/0.4ML IJ SOSY
40.0000 mg | PREFILLED_SYRINGE | INTRAMUSCULAR | Status: DC
  Administered 2023-05-30 – 2023-06-01 (×3): 40 mg via SUBCUTANEOUS
  Filled 2023-05-30 (×3): qty 0.4

## 2023-05-30 MED ORDER — SODIUM BICARBONATE 8.4 % IV SOLN: 100.0000 meq | Freq: Once | INTRAVENOUS | Status: AC

## 2023-05-30 MED ORDER — POTASSIUM CHLORIDE 20 MEQ PO PACK: 60.0000 meq | PACK | Freq: Once | ORAL | Status: DC

## 2023-05-30 MED ORDER — POTASSIUM CHLORIDE 10 MEQ/100ML IV SOLN
10.0000 meq | INTRAVENOUS | Status: DC
  Filled 2023-05-30 (×6): qty 100

## 2023-05-30 MED ORDER — POTASSIUM CHLORIDE 10 MEQ/100ML IV SOLN
10.0000 meq | INTRAVENOUS | Status: AC
  Administered 2023-05-30 (×6): 10 meq via INTRAVENOUS
  Filled 2023-05-30 (×6): qty 100

## 2023-05-30 MED ORDER — DEXMEDETOMIDINE HCL IN NACL 400 MCG/100ML IV SOLN
0.0000 ug/kg/h | INTRAVENOUS | Status: DC
  Administered 2023-05-30: 0.6 ug/kg/h via INTRAVENOUS
  Administered 2023-05-30: 0.4 ug/kg/h via INTRAVENOUS
  Administered 2023-05-30: 0.6 ug/kg/h via INTRAVENOUS
  Administered 2023-05-31: 0.3 ug/kg/h via INTRAVENOUS
  Filled 2023-05-30 (×4): qty 100

## 2023-05-30 MED ORDER — K PHOS MONO-SOD PHOS DI & MONO 155-852-130 MG PO TABS
500.0000 mg | ORAL_TABLET | Freq: Three times a day (TID) | ORAL | Status: AC
  Administered 2023-05-30 (×3): 500 mg via ORAL
  Filled 2023-05-30 (×3): qty 2

## 2023-05-30 MED ORDER — POTASSIUM CHLORIDE 20 MEQ PO PACK
40.0000 meq | PACK | Freq: Once | ORAL | Status: AC
  Administered 2023-05-30: 40 meq via ORAL
  Filled 2023-05-30: qty 2

## 2023-05-30 MED ORDER — SODIUM CHLORIDE 0.9 % IV SOLN
1.0000 g | INTRAVENOUS | Status: AC
  Administered 2023-05-30 – 2023-06-01 (×3): 1 g via INTRAVENOUS
  Filled 2023-05-30 (×3): qty 10

## 2023-05-30 MED ORDER — IPRATROPIUM-ALBUTEROL 0.5-2.5 (3) MG/3ML IN SOLN
3.0000 mL | Freq: Two times a day (BID) | RESPIRATORY_TRACT | Status: DC
  Administered 2023-05-31 – 2023-06-02 (×5): 3 mL via RESPIRATORY_TRACT
  Filled 2023-05-30 (×6): qty 3

## 2023-05-30 MED ORDER — SODIUM BICARBONATE 8.4 % IV SOLN
100.0000 meq | Freq: Once | INTRAVENOUS | Status: AC
  Administered 2023-05-30: 100 meq via INTRAVENOUS
  Filled 2023-05-30: qty 50

## 2023-05-30 MED ORDER — DIAZEPAM 5 MG/ML IJ SOLN
2.5000 mg | Freq: Three times a day (TID) | INTRAMUSCULAR | Status: DC
  Administered 2023-05-30 – 2023-05-31 (×4): 2.5 mg via INTRAVENOUS
  Filled 2023-05-30 (×4): qty 2

## 2023-05-30 NOTE — Progress Notes (Signed)
PHARMACY CONSULT NOTE - FOLLOW UP  Pharmacy Consult for Electrolyte Monitoring and Replacement   Recent Labs: Potassium (mmol/L)  Date Value  05/29/2023 <2.0 (LL)   Calcium (mg/dL)  Date Value  40/98/1191 7.7 (L)   Albumin (g/dL)  Date Value  47/82/9562 3.5  02/26/2022 4.6   Sodium (mmol/L)  Date Value  05/29/2023 128 (L)  02/26/2022 136     Assessment: 8/4 @ 1926 :  K = < 2.0,  Corrected Ca = 8.1   Goal of Therapy:  Electrolytes WNL   Plan:  KCl 10 mEq IV X 6 ordered to start on 8/4 @ 1833, 2 bags given.    8/5 @ 0145:  5 bags of original 6 bag of 10 mEq KCl IVPB have been given per RN.   - Will order additional KCl 10 mEq IV X 6 to start @ 0200. - Will recheck electrolytes with AM labs.   Scherrie Gerlach ,PharmD Clinical Pharmacist 05/30/2023 1:50 AM

## 2023-05-30 NOTE — Progress Notes (Signed)
PHARMACY CONSULT NOTE  Pharmacy Consult for Electrolyte Monitoring and Replacement   Recent Labs: Potassium (mmol/L)  Date Value  05/30/2023 2.9 (L)   Magnesium (mg/dL)  Date Value  16/07/9603 2.8 (H)   Calcium (mg/dL)  Date Value  54/06/8118 7.7 (L)   Albumin (g/dL)  Date Value  14/78/2956 3.3 (L)  02/26/2022 4.6   Phosphorus (mg/dL)  Date Value  21/30/8657 <1.0 (LL)   Sodium (mmol/L)  Date Value  05/30/2023 130 (L)  02/26/2022 136   Assessment: 67 y/o M with history of asthma / COPD on 3L O2, steroid intolerance, HTN, HLD, prediabetes, lumbar radiculopathy, chronic pain syndrome, anxiety / depression admitted with acute metabolic encephalopathy and severe electrolyte derangements.  MIVF: NS + 40 mEq K/L at 50 cc/hr  Goal of Therapy:  Electrolytes within normal limits  Plan:  --K 2.0 to 2.9 after six 10 mEq potassium runs + LR with 40 K >> will order additional Kcl 10 mEq x 4 --Hypophosphatemia on replacement --Follow-up BMP q6h, re-check Mg and Phos in AM   Will M. Dareen Piano, PharmD Clinical Pharmacist 05/30/2023 6:01 PM

## 2023-05-30 NOTE — Progress Notes (Addendum)
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 recurrent flares, intolerant to steroids, HTN, HLD, Prediabetes,  Lumbar radiculopathy, chronic pain syndrome,anxiety and depression who presented to the ED with chief complaints of altered mental status.  Patient presented with c/o increasing anxiety and "dark thoughts". He report that he is ran out of his pain medication and other prescriptions for his depression. He is not a very good historian and is unclear what medications he is currently taking. He is prescribed Effexor which appeared to have been refilled on 04/21/23. He also takes Alprazolam and Buspar. Patient report that he takes medication as prescribed but unclear which medication he took today. He ran out of his pain meds and took some medications from his friend. Unclear what or how much he took. He is also reporting diarrhea and burning with urination.   ED Course: Initial vital signs showed HR of beats/minute, BP mm Hg, the RR 30 breaths/minute, and the oxygen saturation % on and a temperature of 98.68F (36.9C). Patient was lethargic, moaned intermittently, and responded with one-word answers; symmetric movement in the arms and legs was observed. Pertinent Labs/Diagnostics Findings: Na+/ K+125/2.0  Glucose: 147 BUN/Cr.:30/1.91 AST/ALT:48/73 WBC:19.6 Hgb/Hct: 11.1/30.2 Plts: 725 Lactic acid: 2.5 PCT <0.10 COVID PCR: Negative,  UDS+ Opiate, Benzo and cannabinoid UA+ UTI VBG: pO2 pend; pCO2 34; pH 7.54;  HCO3 29.1, %O2 Sat pend.  CXR> CTH> Negative CTA Chest> No acute finding CT Abd/pelvis>No acute finding  Patient given 30 cc/kg of fluids and started on broad-spectrum antibiotics Vancomycin, cefepime and Flagyl for suspected sepsis secondary  to suspected UTI. Patient remained altered and restless requiring benzos, Haldol and sitter at the bedside for safety. Due to persistent altered mental status, PCCM consulted.  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 recurrent flares, intolerant to steroids, HTN, HLD, Prediabetes,  Lumbar radiculopathy, chronic pain syndrome,anxiety and depression  Significant Hospital Events   8/4: Admitted ICU with altered mental status 8/5: Mental status improving, +remains critically low despite repletion Consults:  Psychiatric  Procedures:  None  Significant Diagnostic Tests:  8/4: Chest Xray>IMPRESSION: Low volumes.  No active cardiopulmonary disease.  8/4: Noncontrast CT head> IMPRESSION: No acute intracranial abnormality.  8/4: CTA Chest, abdomen and pelvis>IMPRESSION: 1. Examination is generally limited by patient and breath motion artifact throughout. 2. Within this limitation, no acute CT findings of the chest, abdomen, or pelvis to explain sepsis. 3. Tracheobronchomalacia. Diffuse bilateral bronchial wall thickening. Mosaic attenuation of the airspaces, suggestive of small airways disease. 4. Coronary artery disease. 5. Prostatomegaly.  Interim History / Subjective:    -Mental status improving  Micro Data:  8/4: SARS-CoV-2 PCR> negative 8/4: Influenza PCR> negative 8/4: Blood culture x2> 8/4: Urine Culture> 8/4: MRSA PCR>>   Antimicrobials:  Vancomycin 05/29/23 x 1 Cefepime 05/29/23 x 1 Ceftriaxone 05/29/23 >>   OBJECTIVE  Blood pressure (!) 114/55, pulse 88, temperature 98.1 F (36.7 C), temperature source Oral, resp. rate (!) 22, height 5\' 8"  (1.727 m), weight 92 kg, SpO2 100%.        Intake/Output Summary (Last 24 hours) at 05/30/2023 0504 Last data filed at 05/30/2023 0400 Gross per 24 hour  Intake 1790 ml  Output 250 ml  Net 1540 ml  Filed Weights   05/29/23 1614 05/29/23 2243 05/30/23 0412  Weight: 72.6 kg 91.3 kg 92 kg    Physical Examination  GENERAL: 67 year-old critically ill patient lying in the bed in no acute distress EYES: PEERLA. No scleral icterus. Extraocular muscles intact.  HEENT: Head atraumatic, normocephalic. Oropharynx and nasopharynx clear.  NECK:  No JVD, supple  LUNGS: Normal breath sounds bilaterally.  No use of accessory muscles of respiration.  CARDIOVASCULAR: S1, S2 normal. No murmurs, rubs, or gallops.  ABDOMEN: Soft, NTND EXTREMITIES: No swelling or erythema.  Capillary refill > 3 seconds in all extremities. Pulses palpable distally. NEUROLOGIC: The patient is  alert and oriented x 3 with intermittent consfusion. No new or focal neurological deficit appreciated. Cranial nerves are intact.  SKIN: No obvious rash, lesion, or ulcer. Warm to touch Labs/imaging that I havepersonally reviewed  (right click and "Reselect all SmartList Selections" daily)     Labs   CBC: Recent Labs  Lab 05/29/23 1618 05/30/23 0319  WBC 19.6* 21.5*  HGB 11.1* 9.9*  HCT 30.2* 26.7*  MCV 82.5 81.4  PLT 725* 714*    Basic Metabolic Panel: Recent Labs  Lab 05/29/23 1618 05/29/23 1926 05/30/23 0319  NA 125* 128* 128*  K <2.0* <2.0* <2.0*  CL 81* 87* 89*  CO2 27 25 23   GLUCOSE 147* 89 113*  BUN 30* 29* 28*  CREATININE 1.91* 1.79* 1.80*  CALCIUM 8.5* 7.7* 8.1*  MG  --   --  1.6*  PHOS  --   --  <1.0*   GFR: Estimated Creatinine Clearance: 45 mL/min (A) (by C-G formula based on SCr of 1.8 mg/dL (H)). Recent Labs  Lab 05/29/23 1618 05/29/23 1816 05/29/23 1941 05/30/23 0319  PROCALCITON  --   --  <0.10  --   WBC 19.6*  --   --  21.5*  LATICACIDVEN  --  2.5* 2.2*  --     Liver Function Tests: Recent Labs  Lab 05/29/23 1618 05/30/23 0319  AST 48* 41  ALT 73* 61*  ALKPHOS 101 86  BILITOT 0.7 1.1  PROT 7.3 6.8  ALBUMIN 3.5 3.3*   No results for input(s): "LIPASE", "AMYLASE" in the last 168 hours. Recent Labs  Lab 05/29/23 1816  AMMONIA 14    ABG    Component Value  Date/Time   HCO3 29.1 (H) 05/29/2023 2000   O2SAT PENDING 05/29/2023 2000     Coagulation Profile: Recent Labs  Lab 05/29/23 1816  INR 1.1    Cardiac Enzymes: Recent Labs  Lab 05/29/23 1618  CKTOTAL 74    HbA1C: Hgb A1c MFr Bld  Date/Time Value Ref Range Status  02/26/2022 04:06 PM 5.9 (H) 4.8 - 5.6 % Final    Comment:             Prediabetes: 5.7 - 6.4          Diabetes: >6.4          Glycemic control for adults with diabetes: <7.0   08/25/2021 03:40 PM 6.1 (H) 4.8 - 5.6 % Final    Comment:             Prediabetes: 5.7 - 6.4          Diabetes: >6.4          Glycemic control for adults with diabetes: <7.0     CBG: Recent Labs  Lab 05/29/23 2246  GLUCAP 81    Review of Systems:   Unable to be obtained secondary to  the patient's altered mental status status.    Past Medical History  He,  has a past medical history of Anxiety, Arthritis, Asthma, History of chicken pox, Hyperglycemia, Hyperlipidemia, Hypertension, and Obesity.   Surgical History    Past Surgical History:  Procedure Laterality Date   HERNIA REPAIR  1970's   Inguinal hernia repair x 2    TONSILLECTOMY AND ADENOIDECTOMY  1960's     Social History   reports that he has never smoked. He has never used smokeless tobacco. He reports that he does not drink alcohol and does not use drugs.   Family History   His family history includes Breast cancer in his mother; Hypertension in his father and mother; Melanoma in his father; Parkinson's disease in his father; Throat cancer in his paternal grandmother.   Allergies Allergies  Allergen Reactions   Sulfa Antibiotics      Home Medications  Prior to Admission medications   Medication Sig Start Date End Date Taking? Authorizing Provider  albuterol (PROVENTIL) (2.5 MG/3ML) 0.083% nebulizer solution SMARTSIG:3 Milliliter(s) Via Nebulizer Every 6 Hours PRN 10/08/21   [provider]  albuterol (VENTOLIN HFA) 108 (90 Base) MCG/ACT inhaler  Inhale into the lungs. 06/03/21   [provider]  alprazolam Prudy Feeler) 2 MG tablet TAKE 1 TABLET BY MOUTH FOUR TIMES DAILY 04/08/23   Malva Limes, MD  Azelastine-Fluticasone [phone] MCG/ACT SUSP Place 1 spray into both nostrils 2 (two) times daily.    [provider]  busPIRone (BUSPAR) 15 MG tablet TAKE 1 TABLET(15 MG) BY MOUTH TWICE DAILY 03/07/23   Malva Limes, MD  diltiazem (TIADYLT ER) 120 MG 24 hr capsule TAKE 1 CAPSULE(120 MG) BY MOUTH DAILY 05/04/23   Malva Limes, MD  fexofenadine [facility]) 180 MG tablet Take by mouth. 07/15/21 07/15/22  [provider]  furosemide (LASIX) 20 MG tablet TAKE 1 TABLET(20 MG) BY MOUTH DAILY AS NEEDED FOR FLUID RETENTION 12/30/22   Simmons-Robinson, Tawanna Cooler, MD  hydrochlorothiazide (HYDRODIURIL) 25 MG tablet TAKE 1 TABLET(25 MG) BY MOUTH DAILY 05/06/23   Malva Limes, MD  HYDROcodone-acetaminophen (NORCO) 10-325 MG tablet Take 1 tablet by mouth in the morning, at noon, and at bedtime.  04/17/20   [provider]  ketoconazole (NIZORAL) 2 % cream Apply 1 application topically daily. 07/04/20   Malva Limes, MD  levalbuterol (XOPENEX) 1.25 MG/3ML nebulizer solution levalbuterol 1.25 mg/3 mL solution for nebulization  TAKE 3 MLS BY NEBULIZATION EVERY 6 HOURS AS NEEDED FOR WHEEZING    [provider]  losartan (COZAAR) 100 MG tablet Take 1 tablet by mouth daily. 01/05/22   [provider]  meloxicam (MOBIC) 15 MG tablet TAKE 1 TABLET(15 MG) BY MOUTH DAILY 11/21/19   Malva Limes, MD  montelukast (SINGULAIR) 10 MG tablet Take 1 tablet by mouth daily. 02/18/10   [provider]  Multiple Vitamins-Minerals (CENTRUM SILVER PO) Take 1 tablet by mouth daily. 01/03/09   [provider]  mupirocin ointment (BACTROBAN) 2 % Apply 1 application topically daily. 06/10/20   Erasmo Downer, MD  naloxone [facility]) nasal spray 4 mg/0.1 mL Narcan 4 mg/actuation nasal spray  Take by nasal route  every 3 minutes until patient awakes or EMS arrives.    [provider]  OXYGEN Place 3 L into the nose. At all times    [provider]  PROAIR HFA 108 [phone] Base) MCG/ACT inhaler Inhale 1 puff into the lungs every 6 (six) hours as needed.  02/01/16  [provider]  rosuvastatin (CRESTOR) 20 MG tablet Take 1 tablet (20 mg total) by mouth daily. 05/03/22   Malva Limes, MD  tamsulosin (FLOMAX) 0.4 MG CAPS capsule TAKE 1 CAPSULE(0.4 MG) BY MOUTH DAILY 06/21/22   Stoioff, Verna Czech, MD  triamcinolone cream (KENALOG) 0.1 % Apply 1 application topically 2 (two) times daily. 06/18/21   Malva Limes, MD  valACYclovir (VALTREX) 1000 MG tablet valacyclovir 1 gram tablet  TK 1 T PO TID Patient not taking: Reported on 05/29/2023    [provider]  venlafaxine XR (EFFEXOR-XR) 75 MG 24 hr capsule TAKE 3 CAPSULES(225 MG) BY MOUTH DAILY WITH BREAKFAST 04/21/23   Malva Limes, MD  Scheduled Meds:  heparin  5,000 Units Subcutaneous Q8H   insulin aspart  0-9 Units Subcutaneous Q4H   ipratropium-albuterol  3 mL Nebulization Q6H   Continuous Infusions:  0.9 % NaCl with KCl 40 mEq / L 50 mL/hr at 05/30/23 0400   cefTRIAXone (ROCEPHIN)  IV Stopped (05/30/23 0209)   lactated ringers 150 mL/hr at 05/30/23 0400   potassium chloride 10 mEq (05/30/23 0448)   PRN Meds:.docusate sodium, ipratropium-albuterol, polyethylene glycol   Active Hospital Problem list   See below  Assessment & Plan:  #Sepsis  in the setting of Suspected UTI  Initial interventions/workup included: 30cc/kg of NS/LR & Cefepime/ Vancomycin/Flagyl -F/u cultures, trend lactic/ PCT -Monitor WBC/ fever curve -Start ceftriaxone -IVF hydration as needed -Pressors for MAP goal >65 -Strict I/O's    #Acute Toxic Metabolic Encephalopathy Likely in the setting of drug toxicity and possible sepsis UDS+ Opiates, Benzo and Cannabinoid, he has hx of chronic pain syndrome, anxiety and depression on Alprazolam,  Buspar, Effexor and hydrocodone. Patient admitted that he ran out of his meds and took "some meds" from his friend, it is unknown what or how much he took -EKG shows prolonged QTC, he has AKI with electrolyte derangement hyponatremia and severe hypokalemia concerning for SSRI/SNRI toxicity? -CTH negative -CT Chest abd/pelvis negative -Seizure precautions -Check CK,-*Check TSH, Free T4 -Sodium Bicarb PRN for widened QRS -Supportive care with IVFs -Hold home meds for now -Avoid sedatives as able   #AKI likely ATN in the setting of above #Hypokalemia #Hyponatremia #Non anion gap metabolic acidosis with Lactic Acidosis -trend Lactate -Monitor I&O's / urinary output -Follow serial BMP -Ensure adequate renal perfusion -Avoid nephrotoxic agents as able -Replace electrolytes as indicated   #Hyperglycemia -CBG's q4; Target range of 140 to 180 -SSI -Follow ICU Hypo/Hyperglycemia protocol   #HTN #HLD -Continue Crestor -Hold home losartan, hydrochlorothiazide, diltiazem and Lasix in the setting of AKI and hypotension   #Chronic Pain #Lumbar Radiculopathy  -Hold home meds due to altered mental status and suspected drug intoxication   #Anxiety and Depression -Hold home meds for now -Psych consult, pt now with hallucinations, "dark thoughts" denies suicide ideation, concerns for SSRI/SNRI toxicity although he denies taking his Effexor recently?  #Chronic Asthma/COPD with exacerbation -Supplemental O2 as needed to maintain O2 saturations 88 to 92% -As needed and scheduled bronchodilators -Continue Singulair    Best practice:  Diet:  Oral Pain/Anxiety/Delirium protocol (if indicated): No VAP protocol (if indicated): Not indicated DVT prophylaxis: Subcutaneous Heparin GI prophylaxis: PPI Glucose control:  SSI Yes Central venous access:  N/A Arterial line:  N/A Foley:  N/A Mobility:  bed rest  PT consulted: N/A Last date of multidisciplinary goals of care discussion  [05/28/13] Code Status:  full code Disposition: ICU   = Goals of Care = Code Status  Order: FULL  Primary Emergency Contact: Chrismon,Eddie Wishes to pursue full aggressive treatment and intervention options, including CPR and intubation, but goals of care will be addressed on going with family if that should become necessary.  Critical care time: 45 minutes        Webb Silversmith DNP, CCRN, FNP-C, AGACNP-BC Acute Care & Family Nurse Practitioner [facility] PCCM on call pager [phone]

## 2023-05-30 NOTE — Progress Notes (Signed)
PHARMACY - PHYSICIAN COMMUNICATION CRITICAL VALUE ALERT - BLOOD CULTURE IDENTIFICATION (BCID)  Clarence Hamilton is an 67 y.o. male who presented to [facility] on 05/29/2023 with a chief complaint of altered mental status suspected to be secondary to UTI. UCx growing E. Coli, susceptibilities pending.   Assessment: 1 of 4 bottles growing staph epi, no resistance detected. Likely contaminant  Name of physician (or Provider) Contacted: Dr. Belia Heman  Current antibiotics: ceftriaxone 1g daily   Changes to prescribed antibiotics recommended:  Patient is on recommended antibiotics - No changes needed  No results found for this or any previous visit.  Leonie Douglas, PharmD 05/30/2023  7:05 PM

## 2023-05-30 NOTE — Progress Notes (Signed)
PHARMACY CONSULT NOTE  Pharmacy Consult for Electrolyte Monitoring and Replacement   Recent Labs: Potassium (mmol/L)  Date Value  05/30/2023 2.3 (LL)   Magnesium (mg/dL)  Date Value  38/75/6433 2.8 (H)   Calcium (mg/dL)  Date Value  29/51/8841 7.6 (L)   Albumin (g/dL)  Date Value  66/03/3015 3.3 (L)  02/26/2022 4.6   Phosphorus (mg/dL)  Date Value  11/02/3233 <1.0 (LL)   Sodium (mmol/L)  Date Value  05/30/2023 129 (L)  02/26/2022 136   Assessment: Clarence Hamilton is a 67 y.o. male with history of asthma / COPD on 3L O2, steroid intolerance, HTN, HLD, prediabetes, lumbar radiculopathy, chronic pain syndrome, anxiety / depression admitted with acute metabolic encephalopathy and severe electrolyte derangements.  MIVF: NS + 40 mEq K/L at 50 mL/hr  Goal of Therapy:  Electrolytes within normal limits  Plan:  K 2.3, give additional KCl 10 mEq IV x6  Check BMP with AM labs   Celene Squibb, PharmD Clinical Pharmacist 05/30/2023 10:35 PM

## 2023-05-30 NOTE — Progress Notes (Signed)
PHARMACY CONSULT NOTE  Pharmacy Consult for Electrolyte Monitoring and Replacement   Recent Labs: Potassium (mmol/L)  Date Value  05/30/2023 <2.0 (LL)   Magnesium (mg/dL)  Date Value  09/81/1914 2.8 (H)   Calcium (mg/dL)  Date Value  78/29/5621 7.8 (L)   Albumin (g/dL)  Date Value  30/86/5784 3.3 (L)  02/26/2022 4.6   Phosphorus (mg/dL)  Date Value  69/62/9528 <1.0 (LL)   Sodium (mmol/L)  Date Value  05/30/2023 130 (L)  02/26/2022 136   Assessment: 67 y/o M with history of asthma / COPD on 3L O2, steroid intolerance, HTN, HLD, prediabetes, lumbar radiculopathy, chronic pain syndrome, anxiety / depression admitted with acute metabolic encephalopathy and severe electrolyte derangements.  MIVF: NS + 40 mEq K/L at 50 cc/hr  Goal of Therapy:  Electrolytes within normal limits  Plan:  --Na 130, improving with fluids --K remains < 2, continue fluids with potassium and continue with Kcl 10 mEq per hour additionally --Hypophosphatemia on replacement --Hypomagnesemia resolved with replacement --Follow-up BMP q6h, re-check Mg and Phos in AM   Tressie Ellis 05/30/2023 11:35 AM

## 2023-05-30 NOTE — Plan of Care (Signed)
Pt is progress slowly toward plan of care. He seem more alert and aware this evening. Oriented to self, place and situation but not date & time. Pt continued on precedex gtt  which is working well for him.

## 2023-05-31 ENCOUNTER — Inpatient Hospital Stay: Payer: Medicare HMO

## 2023-05-31 DIAGNOSIS — R29898 Other symptoms and signs involving the musculoskeletal system: Secondary | ICD-10-CM

## 2023-05-31 DIAGNOSIS — N390 Urinary tract infection, site not specified: Secondary | ICD-10-CM | POA: Diagnosis not present

## 2023-05-31 DIAGNOSIS — A419 Sepsis, unspecified organism: Secondary | ICD-10-CM | POA: Diagnosis not present

## 2023-05-31 DIAGNOSIS — J441 Chronic obstructive pulmonary disease with (acute) exacerbation: Secondary | ICD-10-CM

## 2023-05-31 DIAGNOSIS — G928 Other toxic encephalopathy: Secondary | ICD-10-CM | POA: Diagnosis not present

## 2023-05-31 LAB — BASIC METABOLIC PANEL
Anion gap: 11 (ref 5–15)
BUN: 18 mg/dL (ref 8–23)
CO2: 24 mmol/L (ref 22–32)
Calcium: 8.9 mg/dL (ref 8.9–10.3)
Chloride: 101 mmol/L (ref 98–111)
Creatinine, Ser: 1.32 mg/dL — ABNORMAL HIGH (ref 0.61–1.24)
GFR, Estimated: 60 mL/min — ABNORMAL LOW (ref >60)
Glucose, Bld: 105 mg/dL — ABNORMAL HIGH (ref 70–99)
Potassium: 3.7 mmol/L (ref 3.5–5.1)
Sodium: 136 mmol/L (ref 135–145)

## 2023-05-31 LAB — GLUCOSE, CAPILLARY
Glucose-Capillary: 100 mg/dL — ABNORMAL HIGH (ref 70–99)
Glucose-Capillary: 109 mg/dL — ABNORMAL HIGH (ref 70–99)
Glucose-Capillary: 113 mg/dL — ABNORMAL HIGH (ref 70–99)
Glucose-Capillary: 277 mg/dL — ABNORMAL HIGH (ref 70–99)
Glucose-Capillary: 85 mg/dL (ref 70–99)
Glucose-Capillary: 97 mg/dL (ref 70–99)

## 2023-05-31 LAB — POTASSIUM: Potassium: 3.4 mmol/L — ABNORMAL LOW (ref 3.5–5.1)

## 2023-05-31 LAB — MAGNESIUM: Magnesium: 2.2 mg/dL (ref 1.7–2.4)

## 2023-05-31 LAB — PHOSPHORUS: Phosphorus: 2.6 mg/dL (ref 2.5–4.6)

## 2023-05-31 MED ORDER — POTASSIUM CHLORIDE CRYS ER 20 MEQ PO TBCR: 40.0000 meq | EXTENDED_RELEASE_TABLET | Freq: Once | ORAL | Status: DC

## 2023-05-31 MED ORDER — THIAMINE HCL 100 MG/ML IJ SOLN
100.0000 mg | Freq: Every day | INTRAMUSCULAR | Status: DC
  Administered 2023-05-31 – 2023-06-01 (×2): 100 mg via INTRAVENOUS
  Filled 2023-05-31 (×2): qty 2

## 2023-05-31 MED ORDER — ADULT MULTIVITAMIN LIQUID CH
15.0000 mL | Freq: Every day | ORAL | Status: DC
  Filled 2023-05-31: qty 15

## 2023-05-31 MED ORDER — LORAZEPAM 2 MG/ML IJ SOLN
INTRAMUSCULAR | Status: AC
  Filled 2023-05-31: qty 1

## 2023-05-31 MED ORDER — LORAZEPAM 2 MG/ML IJ SOLN
INTRAMUSCULAR | Status: AC
  Administered 2023-05-31: 2 mg
  Filled 2023-05-31: qty 1

## 2023-05-31 MED ORDER — DIAZEPAM 5 MG/ML IJ SOLN
2.5000 mg | Freq: Four times a day (QID) | INTRAMUSCULAR | Status: DC
  Administered 2023-05-31 – 2023-06-02 (×8): 2.5 mg via INTRAVENOUS
  Filled 2023-05-31 (×8): qty 2

## 2023-05-31 MED ORDER — KCL IN DEXTROSE-NACL 20-5-0.45 MEQ/L-%-% IV SOLN
INTRAVENOUS | Status: DC
  Filled 2023-05-31 (×2): qty 1000

## 2023-05-31 MED ORDER — ADULT MULTIVITAMIN W/MINERALS CH
1.0000 | ORAL_TABLET | Freq: Every day | ORAL | Status: DC
  Administered 2023-05-31 – 2023-06-02 (×3): 1 via ORAL
  Filled 2023-05-31 (×3): qty 1

## 2023-05-31 MED ORDER — LEVETIRACETAM IN NACL 1000 MG/100ML IV SOLN
1000.0000 mg | Freq: Once | INTRAVENOUS | Status: DC
  Filled 2023-05-31: qty 100

## 2023-05-31 MED ORDER — LEVETIRACETAM IN NACL 1500 MG/100ML IV SOLN
1500.0000 mg | Freq: Once | INTRAVENOUS | Status: AC
  Administered 2023-05-31: 1500 mg via INTRAVENOUS
  Filled 2023-05-31: qty 100

## 2023-05-31 MED ORDER — DIAZEPAM 5 MG/ML IJ SOLN
2.5000 mg | Freq: Once | INTRAMUSCULAR | Status: AC
  Administered 2023-05-31: 2.5 mg via INTRAVENOUS
  Filled 2023-05-31: qty 2

## 2023-05-31 MED ORDER — LORAZEPAM 2 MG/ML IJ SOLN
2.0000 mg | Freq: Once | INTRAMUSCULAR | Status: AC
  Administered 2023-05-31: 2 mg via INTRAVENOUS

## 2023-05-31 MED ORDER — VITAMIN B-12 1000 MCG PO TABS
1000.0000 ug | ORAL_TABLET | Freq: Every day | ORAL | Status: DC
  Administered 2023-05-31 – 2023-06-02 (×3): 1000 ug via ORAL
  Filled 2023-05-31 (×3): qty 1

## 2023-05-31 MED ORDER — PROCHLORPERAZINE EDISYLATE 10 MG/2ML IJ SOLN
10.0000 mg | Freq: Four times a day (QID) | INTRAMUSCULAR | Status: DC | PRN
  Administered 2023-05-31: 10 mg via INTRAVENOUS
  Filled 2023-05-31: qty 2

## 2023-05-31 MED ORDER — ENSURE ENLIVE PO LIQD
237.0000 mL | Freq: Three times a day (TID) | ORAL | Status: DC
  Administered 2023-05-31 – 2023-06-02 (×6): 237 mL via ORAL
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
## 2023-05-31 NOTE — Plan of Care (Signed)

## 2023-05-31 NOTE — Progress Notes (Signed)
PHARMACY CONSULT NOTE  Pharmacy Consult for Electrolyte Monitoring and Replacement   Recent Labs: Potassium (mmol/L)  Date Value  05/31/2023 2.8 (L)   Magnesium (mg/dL)  Date Value  16/07/9603 2.3   Calcium (mg/dL)  Date Value  54/06/8118 7.7 (L)   Albumin (g/dL)  Date Value  14/78/2956 2.8 (L)  02/26/2022 4.6   Phosphorus (mg/dL)  Date Value  21/30/8657 3.3   Sodium (mmol/L)  Date Value  05/31/2023 131 (L)  02/26/2022 136   Assessment: Clarence Hamilton is a 67 y.o. male with history of asthma / COPD on 3L O2, steroid intolerance, HTN, HLD, prediabetes, lumbar radiculopathy, chronic pain syndrome, anxiety / depression admitted with acute metabolic encephalopathy and severe electrolyte derangements.  MIVF: NS + 40 mEq K/L at 50 mL/hr  Goal of Therapy:  Electrolytes within normal limits  Plan:  --K 2.8, continue MIVF as above with potassium and Kcl 10 mEq q2h as ordered --Potassium re-check today at 1300 and then re-check all electrolytes with AM labs tomorrow  Tressie Ellis 05/31/2023 7:45 AM

## 2023-05-31 NOTE — Plan of Care (Signed)
  Problem: Education: Goal: Ability to describe self-care measures that may prevent or decrease complications (Diabetes Survival Skills Education) will improve Outcome: Progressing   Problem: Coping: Goal: Ability to adjust to condition or change in health will improve Outcome: Progressing   Problem: Fluid Volume: Goal: Ability to maintain a balanced intake and output will improve Outcome: Progressing   Problem: Health Behavior/Discharge Planning: Goal: Ability to manage health-related needs will improve Outcome: Progressing   Problem: Metabolic: Goal: Ability to maintain appropriate glucose levels will improve Outcome: Progressing   Problem: Skin Integrity: Goal: Risk for impaired skin integrity will decrease Outcome: Progressing   Problem: Tissue Perfusion: Goal: Adequacy of tissue perfusion will improve Outcome: Progressing   Problem: Clinical Measurements: Goal: Will remain free from infection Outcome: Progressing Goal: Cardiovascular complication will be avoided Outcome: Progressing   Problem: Coping: Goal: Level of anxiety will decrease Outcome: Progressing   Problem: Skin Integrity: Goal: Risk for impaired skin integrity will decrease Outcome: Progressing

## 2023-05-31 NOTE — Progress Notes (Signed)
Pt having periods of twitching,tremors,profusely sweating, neuro checks intact, Vitals signs stable, CBG 100, . No complains of pain. Dr Belia Heman notified. Per MD will orders labs. See new orders.

## 2023-05-31 NOTE — Progress Notes (Signed)
500PM Called to bedside to evaluate patient with acute twitching of RT side face and Rt hand, Left foot, patient was able to follow commands, no pain, moves all extremities no weakness, BMP, Ca, Phosh, Mag ordered, FSBS 100 and VS stable  532PM patient with more pronounced twitching and more somnolence, patient with acute seizure like activity with arching of back and posturing, 4 Mg Ativan given, Valium 2.5 mg to be given, will order IV Keppra Discussion with Neuro, will obtain EEG, CT head    Additional Critical Care Time devoted to patient care services described in this note is 55 minutes.  Critical care was necessary to treat /prevent imminent and life-threatening deterioration.   Lucie Leather, M.D.  [facility]  Medical Director [facility] Medical Director [facility] Cardio-Pulmonary Department

## 2023-05-31 NOTE — Progress Notes (Signed)
PHARMACY CONSULT NOTE  Pharmacy Consult for Electrolyte Monitoring and Replacement   Recent Labs: Potassium (mmol/L)  Date Value  05/31/2023 3.4 (L)   Magnesium (mg/dL)  Date Value  16/07/9603 2.3   Calcium (mg/dL)  Date Value  54/06/8118 7.7 (L)   Albumin (g/dL)  Date Value  14/78/2956 2.8 (L)  02/26/2022 4.6   Phosphorus (mg/dL)  Date Value  21/30/8657 3.3   Sodium (mmol/L)  Date Value  05/31/2023 131 (L)  02/26/2022 136   Assessment: Clarence Hamilton is a 67 y.o. male with history of asthma / COPD on 3L O2, steroid intolerance, HTN, HLD, prediabetes, lumbar radiculopathy, chronic pain syndrome, anxiety / depression admitted with acute metabolic encephalopathy and severe electrolyte derangements.  MIVF: NS + 40 mEq K/L at 50 mL/hr  Goal of Therapy:  Electrolytes within normal limits  Plan:  --K 3.4, continue MIVF as above and give an additional Kcl 40 mEq PO x 1 dose today --Re-check all electrolytes with AM labs tomorrow  Tressie Ellis 05/31/2023 2:52 PM

## 2023-05-31 NOTE — Progress Notes (Signed)
Initial Nutrition Assessment  DOCUMENTATION CODES:   Not applicable  INTERVENTION:   Ensure Enlive po TID, each supplement provides 350 kcal and 20 grams of protein.  Magic cup TID with meals, each supplement provides 290 kcal and 9 grams of protein  MVI po daily   Pt at high refeed risk; recommend monitor potassium, magnesium and phosphorus labs daily until stable  Daily weights   NUTRITION DIAGNOSIS:   Unintentional weight loss related to poor appetite as evidenced by 15 percent weight loss in 8 months.  GOAL:   Patient will meet greater than or equal to 90% of their needs  MONITOR:   PO intake, Supplement acceptance, Labs, Weight trends, I & O's, Skin  REASON FOR ASSESSMENT:   Consult Assessment of nutrition requirement/status  ASSESSMENT:   67 y/o male with h/o anxiety, depression, HLD, HTN, ILD/COPD, CKD III, lumbar radiculopathy, chronic pain and substance abuse who is admitted with UTI, sepsis and AKI.  Met with pt in room today. Pt reports poor appetite and oral intake at baseline. Pt reports that he just has no appetite and has not eaten well in months. Pt denies any issues with chewing or swallowing. Pt does report "moderate weight loss" but is unsure how much. Per chart, pt appears to be down ~77lbs since 2022. More recently, pt is down 36lbs(15%) over the past 8 months; this is significant weight loss. Pt found with AMS in unsanitary conditions. RD suspects pt's poor oral intake is r/t social/environmental issues. Pt reports that today he is hungry and is ready to eat. Pt initiated on a regular diet today. RD discussed with pt the importance of adequate nutrition needed to preserve lean muscle. Pt is willing to try Ensure in hospital. RD will add supplements to help pt meet his estimated needs. Pt is at high refeed risk. Pt with numerous electrolyte abnormalities on admission likely secondary to starvation; pharmacy following. Pt reports that he does not drink  alcohol.     Medications reviewed and include: B12, lovenox, insulin, MVI, thiamine, NaCl w/ KCl @50ml /hr, ceftriaxone   Labs reviewed: Na 131(L), K 2.8(L), creat 1.28(L), P 3.3 wnl, Mg 2.3 wnl Wbc- 12.5(H), Hgb 8.4(L), Hct 22.8(L) Cbgs- 97, 113 x 24 hrs  AIC 5.6- 8/5  NUTRITION - FOCUSED PHYSICAL EXAM:  Flowsheet Row Most Recent Value  Orbital Region No depletion  Upper Arm Region No depletion  Thoracic and Lumbar Region No depletion  Buccal Region No depletion  Temple Region No depletion  Clavicle Bone Region Mild depletion  Clavicle and Acromion Bone Region Mild depletion  Scapular Bone Region No depletion  Dorsal Hand No depletion  Patellar Region Moderate depletion  Anterior Thigh Region Moderate depletion  Posterior Calf Region Moderate depletion  Edema (RD Assessment) Mild  Hair Reviewed  Eyes Reviewed  Mouth Reviewed  Skin Reviewed  Nails Reviewed   Diet Order:   Diet Order             Diet regular Room service appropriate? Yes; Fluid consistency: Thin  Diet effective now                  EDUCATION NEEDS:   Education needs have been addressed  Skin:  Skin Assessment: Reviewed RN Assessment  Last BM:  8/5- TYPE 7  Height:   Ht Readings from Last 1 Encounters:  05/29/23 5\' 8"  (1.727 m)    Weight:   Wt Readings from Last 1 Encounters:  05/31/23 92.1 kg    Ideal Body  Weight:  70 kg  BMI:  Body mass index is 30.87 kg/m.  Estimated Nutritional Needs:   Kcal:  1900-2200kcal/day  Protein:  95-110g/day  Fluid:  1.9-2.2L/day  Betsey Holiday MS, RD, LDN Please refer to [facility] for RD and/or RD on-call/weekend/after hours pager

## 2023-05-31 NOTE — Progress Notes (Addendum)
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 recurrent flares, intolerant to steroids, HTN, HLD, Prediabetes,  Lumbar radiculopathy, chronic pain syndrome,anxiety and depression who presented to the ED with chief complaints of altered mental status.  Patient presented with c/o increasing anxiety and "dark thoughts". He report that he is ran out of his pain medication and other prescriptions for his depression. He is not a very good historian and is unclear what medications he is currently taking. He is prescribed Effexor which appeared to have been refilled on 04/21/23. He also takes Alprazolam and Buspar. Patient report that he takes medication as prescribed but unclear which medication he took today. He ran out of his pain meds and took some medications from his friend. Unclear what or how much he took. He is also reporting diarrhea and burning with urination.   ED Course: Initial vital signs showed HR of beats/minute, BP mm Hg, the RR 30 breaths/minute, and the oxygen saturation % on and a temperature of 98.32F (36.9C). Patient was lethargic, moaned intermittently, and responded with one-word answers; symmetric movement in the arms and legs was observed. Pertinent Labs/Diagnostics Findings: Na+/ K+125/<2.0, phosphorus <1.0, mag 1.6 Glucose: 147 BUN/Cr.:30/1.91 AST/ALT:48/73 WBC:19.6 Hgb/Hct: 11.1/30.2 Plts: 725 Lactic acid: 2.5 PCT <0.10 COVID PCR: Negative,  UDS+ Opiate, Benzo and cannabinoid UA+ UTI VBG: pO2 pend; pCO2 34; pH 7.54;  HCO3 29.1, %O2 Sat pend.  CXR> CTH> Negative CTA Chest> No acute finding CT Abd/pelvis>No acute finding  Patient given 30 cc/kg of fluids and started on broad-spectrum antibiotics Vancomycin, cefepime and Flagyl for  suspected sepsis secondary to suspected UTI. Patient remained altered and restless requiring benzos, Haldol and sitter at the bedside for safety. Due to persistent altered mental status, PCCM consulted.  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 recurrent flares, intolerant to steroids, HTN, HLD, Prediabetes,  Lumbar radiculopathy, chronic pain syndrome,anxiety and depression  Significant Hospital Events   8/4: Admitted ICU with altered mental status 8/5: Mental status improving, +remains critically low despite repletion 8/5: Mental status fluctuating with periods of restlessness now on precedex gtt, K+, phos and Mag remains critically low now with concerns for refeeding syndrome. Consults:  Psychiatric  Procedures:  None  Significant Diagnostic Tests:  8/4: Chest Xray>IMPRESSION: Low volumes.  No active cardiopulmonary disease.  8/4: Noncontrast CT head> IMPRESSION: No acute intracranial abnormality.  8/4: CTA Chest, abdomen and pelvis>IMPRESSION: 1. Examination is generally limited by patient and breath motion artifact throughout. 2. Within this limitation, no acute CT findings of the chest, abdomen, or pelvis to explain sepsis. 3. Tracheobronchomalacia. Diffuse bilateral bronchial wall thickening. Mosaic attenuation of the airspaces, suggestive of small airways disease. 4. Coronary artery disease. 5. Prostatomegaly.  Interim History / Subjective:    -Mental status improving  Micro Data:  8/4: SARS-CoV-2 PCR> negative 8/4: Influenza PCR> negative 8/4: Blood culture x2> 8/4: Urine Culture> 8/4: MRSA PCR>>   Antimicrobials:  Vancomycin 05/29/23 x 1 Cefepime 05/29/23 x 1 Ceftriaxone 05/29/23 >>   OBJECTIVE  Blood pressure 111/84, pulse 67, temperature 98.1 F (36.7 C), temperature source Oral, resp. rate 13, height 5\' 8"  (1.727 m), weight 92 kg, SpO2 100%.        Intake/Output Summary (Last 24  hours) at 05/31/2023 0250 Last data filed at  05/31/2023 9629 Gross per 24 hour  Intake 4506.41 ml  Output 2100 ml  Net 2406.41 ml   Filed Weights   05/29/23 1614 05/29/23 2243 05/30/23 0412  Weight: 72.6 kg 91.3 kg 92 kg   Physical Examination  GENERAL: 67 year-old critically ill patient lying in the bed in no acute distress EYES: PEERLA. No scleral icterus. Extraocular muscles intact.  HEENT: Head atraumatic, normocephalic. Oropharynx and nasopharynx clear.  NECK:  No JVD, supple  LUNGS: Normal breath sounds bilaterally.  No use of accessory muscles of respiration.  CARDIOVASCULAR: S1, S2 normal. No murmurs, rubs, or gallops.  ABDOMEN: Soft, NTND EXTREMITIES: No swelling or erythema.  Capillary refill > 3 seconds in all extremities. Pulses palpable distally. NEUROLOGIC: The patient is  alert and oriented x 3 with intermittent consfusion. No new or focal neurological deficit appreciated. Cranial nerves are intact.  SKIN: No obvious rash, lesion, or ulcer. Warm to touch Labs/imaging that I havepersonally reviewed  (right click and "Reselect all SmartList Selections" daily)     Labs   CBC: Recent Labs  Lab 05/29/23 1618 05/30/23 0319  WBC 19.6* 21.5*  HGB 11.1* 9.9*  HCT 30.2* 26.7*  MCV 82.5 81.4  PLT 725* 714*    Basic Metabolic Panel: Recent Labs  Lab 05/29/23 1926 05/30/23 0319 05/30/23 1017 05/30/23 1714 05/30/23 2147  NA 128* 128* 130* 130* 129*  K <2.0* <2.0* <2.0* 2.9* 2.3*  CL 87* 89* 93* 93* 93*  CO2 25 23 23 26 26   GLUCOSE 89 113* 128* 113* 118*  BUN 29* 28* 23 22 21   CREATININE 1.79* 1.80* 1.71* 1.51* 1.46*  CALCIUM 7.7* 8.1* 7.8* 7.7* 7.6*  MG  --  1.6* 2.8*  --   --   PHOS  --  <1.0*  --   --   --    GFR: Estimated Creatinine Clearance: 55.5 mL/min (A) (by C-G formula based on SCr of 1.46 mg/dL (H)). Recent Labs  Lab 05/29/23 1618 05/29/23 1816 05/29/23 1941 05/30/23 0319  PROCALCITON  --   --  <0.10  --   WBC 19.6*  --   --  21.5*  LATICACIDVEN  --  2.5* 2.2*  --     Liver  Function Tests: Recent Labs  Lab 05/29/23 1618 05/30/23 0319  AST 48* 41  ALT 73* 61*  ALKPHOS 101 86  BILITOT 0.7 1.1  PROT 7.3 6.8  ALBUMIN 3.5 3.3*   No results for input(s): "LIPASE", "AMYLASE" in the last 168 hours. Recent Labs  Lab 05/29/23 1816  AMMONIA 14    ABG    Component Value Date/Time   HCO3 29.1 (H) 05/29/2023 2000     Coagulation Profile: Recent Labs  Lab 05/29/23 1816  INR 1.1    Cardiac Enzymes: Recent Labs  Lab 05/29/23 1618  CKTOTAL 74    HbA1C: Hgb A1c MFr Bld  Date/Time Value Ref Range Status  05/30/2023 03:19 AM 5.6 4.8 - 5.6 % Final    Comment:    (NOTE) Pre diabetes:          5.7%-6.4%  Diabetes:              >6.4%  Glycemic control for   <7.0% adults with diabetes   02/26/2022 04:06 PM 5.9 (H) 4.8 - 5.6 % Final    Comment:             Prediabetes: 5.7 - 6.4  Diabetes: >6.4          Glycemic control for adults with diabetes: <7.0     CBG: Recent Labs  Lab 05/30/23 0806 05/30/23 1141 05/30/23 1620 05/30/23 2005 05/30/23 2321  GLUCAP 119* 123* 105* 107* 100*    Review of Systems:   Unable to be obtained secondary to the patient's altered mental status status.    Past Medical History  He,  has a past medical history of Anxiety, Arthritis, Asthma, History of chicken pox, Hyperglycemia, Hyperlipidemia, Hypertension, and Obesity.   Surgical History    Past Surgical History:  Procedure Laterality Date   HERNIA REPAIR  1970's   Inguinal hernia repair x 2    TONSILLECTOMY AND ADENOIDECTOMY  1960's     Social History   reports that he has never smoked. He has never used smokeless tobacco. He reports that he does not drink alcohol and does not use drugs.   Family History   His family history includes Breast cancer in his mother; Hypertension in his father and mother; Melanoma in his father; Parkinson's disease in his father; Throat cancer in his paternal grandmother.   Allergies Allergies  Allergen  Reactions   Sulfa Antibiotics      Home Medications  Prior to Admission medications   Medication Sig Start Date End Date Taking? Authorizing Provider  albuterol (PROVENTIL) (2.5 MG/3ML) 0.083% nebulizer solution SMARTSIG:3 Milliliter(s) Via Nebulizer Every 6 Hours PRN 10/08/21   [provider]  albuterol (VENTOLIN HFA) 108 (90 Base) MCG/ACT inhaler Inhale into the lungs. 06/03/21   [provider]  alprazolam Prudy Feeler) 2 MG tablet TAKE 1 TABLET BY MOUTH FOUR TIMES DAILY 04/08/23   Malva Limes, MD  Azelastine-Fluticasone [phone] MCG/ACT SUSP Place 1 spray into both nostrils 2 (two) times daily.    [provider]  busPIRone (BUSPAR) 15 MG tablet TAKE 1 TABLET(15 MG) BY MOUTH TWICE DAILY 03/07/23   Malva Limes, MD  diltiazem (TIADYLT ER) 120 MG 24 hr capsule TAKE 1 CAPSULE(120 MG) BY MOUTH DAILY 05/04/23   Malva Limes, MD  fexofenadine [facility]) 180 MG tablet Take by mouth. 07/15/21 07/15/22  [provider]  furosemide (LASIX) 20 MG tablet TAKE 1 TABLET(20 MG) BY MOUTH DAILY AS NEEDED FOR FLUID RETENTION 12/30/22   Simmons-Robinson, Tawanna Cooler, MD  hydrochlorothiazide (HYDRODIURIL) 25 MG tablet TAKE 1 TABLET(25 MG) BY MOUTH DAILY 05/06/23   Malva Limes, MD  HYDROcodone-acetaminophen (NORCO) 10-325 MG tablet Take 1 tablet by mouth in the morning, at noon, and at bedtime.  04/17/20   [provider]  ketoconazole (NIZORAL) 2 % cream Apply 1 application topically daily. 07/04/20   Malva Limes, MD  levalbuterol (XOPENEX) 1.25 MG/3ML nebulizer solution levalbuterol 1.25 mg/3 mL solution for nebulization  TAKE 3 MLS BY NEBULIZATION EVERY 6 HOURS AS NEEDED FOR WHEEZING    [provider]  losartan (COZAAR) 100 MG tablet Take 1 tablet by mouth daily. 01/05/22   [provider]  meloxicam (MOBIC) 15 MG tablet TAKE 1 TABLET(15 MG) BY MOUTH DAILY 11/21/19   Malva Limes, MD  montelukast (SINGULAIR) 10 MG tablet Take 1 tablet by mouth  daily. 02/18/10   [provider]  Multiple Vitamins-Minerals (CENTRUM SILVER PO) Take 1 tablet by mouth daily. 01/03/09   [provider]  mupirocin ointment (BACTROBAN) 2 % Apply 1 application topically daily. 06/10/20   Erasmo Downer, MD  naloxone [facility]) nasal spray 4 mg/0.1 mL Narcan 4 mg/actuation nasal spray  Take by nasal route every 3 minutes until patient awakes or EMS arrives.    [provider]  OXYGEN Place 3 L into the nose. At all times    [provider]  PROAIR HFA [phone] Base) MCG/ACT inhaler Inhale 1 puff into the lungs every 6 (six) hours as needed.  02/01/16   [provider]  rosuvastatin (CRESTOR) 20 MG tablet Take 1 tablet (20 mg total) by mouth daily. 05/03/22   Malva Limes, MD  tamsulosin (FLOMAX) 0.4 MG CAPS capsule TAKE 1 CAPSULE(0.4 MG) BY MOUTH DAILY 06/21/22   Stoioff, Verna Czech, MD  triamcinolone cream (KENALOG) 0.1 % Apply 1 application topically 2 (two) times daily. 06/18/21   Malva Limes, MD  valACYclovir (VALTREX) 1000 MG tablet valacyclovir 1 gram tablet  TK 1 T PO TID Patient not taking: Reported on 05/29/2023    [provider]  venlafaxine XR (EFFEXOR-XR) 75 MG 24 hr capsule TAKE 3 CAPSULES(225 MG) BY MOUTH DAILY WITH BREAKFAST 04/21/23   Malva Limes, MD  Scheduled Meds:  Chlorhexidine Gluconate Cloth  6 each Topical QHS   diazepam  2.5 mg Intravenous Q8H   enoxaparin (LOVENOX) injection  40 mg Subcutaneous Q24H   insulin aspart  0-9 Units Subcutaneous Q4H   ipratropium-albuterol  3 mL Nebulization BID   Continuous Infusions:  0.9 % NaCl with KCl 40 mEq / L 50 mL/hr at 05/31/23 6962   cefTRIAXone (ROCEPHIN)  IV Stopped (05/31/23 0156)   dexmedetomidine (PRECEDEX) IV infusion 0.6 mcg/kg/hr (05/31/23 0213)   potassium chloride 50 mL/hr at 05/31/23 0213   PRN Meds:.docusate sodium, ipratropium-albuterol, polyethylene glycol   Active Hospital Problem list   See below  Assessment &  Plan:  #Sepsis Secondary to UTI  UCx growing E. Coli, susceptibilities pending.  -F/u cultures, trend lactic/ PCT -Monitor WBC/ fever curve -Continue ceftriaxone -IVF hydration as needed -Pressors for MAP goal >65 -Strict I/O's    #Acute Toxic Metabolic Encephalopathy Likely Multifactorial in the setting of sepsis, drug intoxication and ?refeeding syndrome UDS+ Opiates, Benzo and Cannabinoid, he has hx of chronic pain syndrome, anxiety and depression on Alprazolam, Buspar, Effexor and hydrocodone. Patient admitted that he ran out of his meds and took "some meds" from his friend, it is unknown what or how much he took -EKG shows prolonged QTC, he has AKI with electrolyte derangement hyponatremia and severe hypokalemia concerning for  refeeding syndrome, SNRI toxicity ? -CTH negative -CT Chest abd/pelvis negative -Seizure precautions -Sodium Bicarb PRN for widened QRS -Supportive care with IVFs -Hold home meds for now -Avoid sedatives as able  #Concerns for Refeeding syndrome Patient present with Na+/ K+125/<2.0, phosphorus <1.0, mag 1.6 Given persistent severe Hypokalemia, hypophosphatemia, hypomagnesemia and delirious state in a patient who has has recent significant weight loss >10% of body weight  per nephew and started feeding on "junk" there is high probability of Refeeding syndrome -Replete K/Mg/Phos aggressively (target K>4 mM and Mg >2 mg/dL, XBMW>4.1). -Thiamine 100 mg IV daily -Vitamin B12, 1000 mcg PO daily. -Daily multivitamin. -Dietary consult  #AKI likely ATN in the setting of above #Hypokalemia, Hyponatremia, Hypomagnesemia -Monitor I&O's / urinary output -Follow serial BMP -Ensure adequate renal perfusion -Avoid nephrotoxic agents as able -Replace electrolytes as indicated  #Hyperglycemia -CBG's q4; Target range of 140 to 180 -SSI -Follow ICU Hypo/Hyperglycemia protocol   #HTN #HLD -Continue Crestor -Hold home losartan, hydrochlorothiazide, diltiazem and  Lasix in the setting of AKI and hypotension   #Chronic Pain #Lumbar Radiculopathy  -  Hold home meds due to altered mental status and suspected drug intoxication   #Anxiety and Depression -Hold home meds for now -Psych consult, pt now with hallucinations, "dark thoughts" denies suicide ideation, concerns for SSRI/SNRI toxicity although he denies taking his Effexor recently?  #Chronic Asthma/COPD with exacerbation -Supplemental O2 as needed to maintain O2 saturations 88 to 92% -As needed and scheduled bronchodilators -Continue Singulair    Best practice:  Diet:  Oral Pain/Anxiety/Delirium protocol (if indicated): No VAP protocol (if indicated): Not indicated DVT prophylaxis: Subcutaneous Heparin GI prophylaxis: PPI Glucose control:  SSI Yes Central venous access:  N/A Arterial line:  N/A Foley:  N/A Mobility:  bed rest  PT consulted: N/A Last date of multidisciplinary goals of care discussion [05/28/13] Code Status:  full code Disposition: ICU   = Goals of Care = Code Status Order: FULL  Primary Emergency Contact: Chrismon,Eddie Wishes to pursue full aggressive treatment and intervention options, including CPR and intubation, but goals of care will be addressed on going with family if that should become necessary.  Critical care time: 45 minutes        Webb Silversmith DNP, CCRN, FNP-C, AGACNP-BC Acute Care & Family Nurse Practitioner [facility] PCCM on call pager [phone]    ICU ATTENDING ATTESTATION:  Patient seen and examined and relevant ancillary tests reviewed.   I agree with the assessment and plan of care as outlined by Webb Silversmith NP.     BP 125/74   Pulse 77   Temp 97.6 F (36.4 C) (Axillary)   Resp 17   Ht 5\' 8"  (1.727 m)   Wt 92.1 kg   SpO2 100%   BMI 30.87 kg/m   CBC    Component Value Date/Time   WBC 12.5 (H) 05/31/2023 0349   RBC 2.75 (L) 05/31/2023 0349   HGB 8.4 (L) 05/31/2023 0349   HGB  13.2 02/26/2022 1606   HCT 22.8 (L) 05/31/2023 0349   HCT 39.6 02/26/2022 1606   PLT 570 (H) 05/31/2023 0349   PLT 379 02/26/2022 1606   MCV 82.9 05/31/2023 0349   MCV 88 02/26/2022 1606   MCH 30.5 05/31/2023 0349   MCHC 36.8 (H) 05/31/2023 0349   RDW 14.0 05/31/2023 0349   RDW 13.3 02/26/2022 1606        Latest Ref Rng & Units 05/31/2023    3:49 AM 05/30/2023    9:47 PM 05/30/2023    5:14 PM  BMP  Glucose 70 - 99 mg/dL 063  016  010   BUN 8 - 23 mg/dL 19  21  22    Creatinine 0.61 - 1.24 mg/dL 9.32  3.55  7.32   Sodium 135 - 145 mmol/L 131  129  130   Potassium 3.5 - 5.1 mmol/L 2.8  2.3  2.9   Chloride 98 - 111 mmol/L 97  93  93   CO2 22 - 32 mmol/L 26  26  26    Calcium 8.9 - 10.3 mg/dL 7.7  7.6  7.7          Critical Care Time devoted to patient care services described in this note is 42 Total Care Time minutes.     Lucie Leather, M.D.  [facility]  Medical Director [facility] Medical Director [facility] Cardio-Pulmonary Department

## 2023-06-01 ENCOUNTER — Ambulatory Visit: Payer: Medicare HMO

## 2023-06-01 ENCOUNTER — Inpatient Hospital Stay: Payer: Medicare HMO

## 2023-06-01 DIAGNOSIS — R569 Unspecified convulsions: Secondary | ICD-10-CM

## 2023-06-01 DIAGNOSIS — R4182 Altered mental status, unspecified: Secondary | ICD-10-CM

## 2023-06-01 DIAGNOSIS — R410 Disorientation, unspecified: Secondary | ICD-10-CM | POA: Diagnosis not present

## 2023-06-01 DIAGNOSIS — G928 Other toxic encephalopathy: Secondary | ICD-10-CM | POA: Diagnosis not present

## 2023-06-01 LAB — GLUCOSE, CAPILLARY
Glucose-Capillary: 101 mg/dL — ABNORMAL HIGH (ref 70–99)
Glucose-Capillary: 149 mg/dL — ABNORMAL HIGH (ref 70–99)
Glucose-Capillary: 118 mg/dL — ABNORMAL HIGH (ref 70–99)
Glucose-Capillary: 102 mg/dL — ABNORMAL HIGH (ref 70–99)
Glucose-Capillary: 105 mg/dL — ABNORMAL HIGH (ref 70–99)
Glucose-Capillary: 97 mg/dL (ref 70–99)

## 2023-06-01 LAB — HEPATITIS PANEL, ACUTE
HCV Ab: NONREACTIVE
Hep A IgM: NONREACTIVE
Hep B C IgM: NONREACTIVE
Hepatitis B Surface Ag: NONREACTIVE

## 2023-06-01 LAB — AMMONIA: Ammonia: 18 umol/L (ref 9–35)

## 2023-06-01 LAB — VITAMIN B12: Vitamin B-12: 954 pg/mL — ABNORMAL HIGH (ref 180–914)

## 2023-06-01 MED ORDER — POTASSIUM CHLORIDE 10 MEQ/100ML IV SOLN
10.0000 meq | INTRAVENOUS | Status: AC
  Administered 2023-06-01 (×2): 10 meq via INTRAVENOUS
  Filled 2023-06-01 (×2): qty 100

## 2023-06-01 MED ORDER — POTASSIUM CHLORIDE CRYS ER 20 MEQ PO TBCR
40.0000 meq | EXTENDED_RELEASE_TABLET | Freq: Once | ORAL | Status: AC
  Administered 2023-06-01: 40 meq via ORAL
  Filled 2023-06-01: qty 2

## 2023-06-01 MED ORDER — ORAL CARE MOUTH RINSE: 15.0000 mL | OROMUCOSAL | Status: DC | PRN

## 2023-06-01 MED ORDER — KCL-LACTATED RINGERS-D5W 20 MEQ/L IV SOLN
INTRAVENOUS | Status: DC
  Filled 2023-06-01 (×7): qty 1000

## 2023-06-01 NOTE — Procedures (Signed)
Patient Name: Clarence Hamilton  MRN: [id number]  Epilepsy Attending: Charlsie Quest  Referring Physician/Provider: Milon Dikes, MD  Date: 06/01/2023 Duration: 27.52 mins  Patient history: 67yo M with ams getting eeg to evaluate for seizure.  Level of alertness: Awake  AEDs during EEG study: Valium  Technical aspects: This EEG study was done with scalp electrodes positioned according to the 10-20 International system of electrode placement. Electrical activity was reviewed with band pass filter of 1-70Hz , sensitivity of 7 uV/mm, display speed of 43mm/sec with a 60Hz  notched filter applied as appropriate. EEG data were recorded continuously and digitally stored.  Video monitoring was available and reviewed as appropriate.  Description: No clear posterior dominant rhythm was seen. There is an excessive amount of 15 to 18 Hz beta activity distributed symmetrically and diffusely. Hyperventilation and photic stimulation were not performed.     ABNORMALITY - Excessive beta, generalized  IMPRESSION: This study is within normal limits. The excessive beta activity seen in the background is most likely due to the effect of benzodiazepine and is a benign EEG pattern. No seizures or epileptiform discharges were seen throughout the recording.  A normal interictal EEG does not exclude the diagnosis of epilepsy.   [name]

## 2023-06-01 NOTE — TOC Initial Note (Signed)
Transition of Care [facility] [place]) - Initial/Assessment Note    Patient Details  Name: Clarence Hamilton MRN: [id number] Date of Birth: April 15, 1956  Transition of Care [facility]) CM/SW Contact:    Kreg Shropshire, RN Phone Number: 06/01/2023, 3:23 PM  Clinical Narrative:                  Cm received consult for HH/DME. Cm was informed by nurse and nephew. Lysle Morales stated  that living conditions in home such as excessive junk, feces, and unsafe living environment. Nephew stated that pt is not safe going home. Nephew is over his finances and is going get information. Cm explained that insurance does not cover long term care. Cm will continue to follow for needs.   Barriers to Discharge: Continued Medical Work up   Patient Goals and CMS Choice       [place] ownership interest in [facility].provided to:: [facility] POA / Guardian    Expected Discharge Plan and Services     Post Acute Care Choice: Home Health, Skilled Nursing Facility Living arrangements for the past 2 months: Single Family Home                                      Prior Living Arrangements/Services Living arrangements for the past 2 months: Single Family Home Lives with:: Self          Need for Family Participation in Patient Care: Yes (Comment) Care giver support system in place?: Yes (comment)      Activities of Daily Living Home Assistive Devices/Equipment: None ADL Screening (condition at time of admission) Patient's cognitive ability adequate to safely complete daily activities?: Yes Is the patient deaf or have difficulty hearing?: No Does the patient have difficulty seeing, even when wearing glasses/contacts?: No Does the patient have difficulty concentrating, remembering, or making decisions?: No Patient able to express need for assistance with ADLs?: Yes Does the patient have difficulty dressing or bathing?: No Independently performs ADLs?: Yes (appropriate for developmental age) Does the  patient have difficulty walking or climbing stairs?: No Weakness of Legs: None Weakness of Arms/Hands: None  Permission Sought/Granted                  Emotional Assessment Appearance:: Appears stated age     Orientation: : Oriented to Self      Admission diagnosis:  Altered mental status [R41.82] Patient Active Problem List   Diagnosis Date Noted   Altered mental status 05/29/2023   Chronic kidney disease, stage 3 unspecified (HCC) 02/27/2022   Lumbar radiculopathy 10/31/2021   Chronic respiratory failure with hypoxia (HCC) 02/17/2021   ILD (interstitial lung disease) (HCC) 12/25/2019   Osteoarthritis of left knee 05/16/2019   Osteoarthritis of right knee 05/16/2019   Pain in right knee 08/18/2018   Lumbar spondylosis 07/20/2018   Degeneration of lumbar intervertebral disc 03/23/2018   Aortic atherosclerosis (HCC) 10/14/2017   Elevated PSA 04/01/2016   Morbid obesity, unspecified obesity type (HCC) 02/16/2016   Dermatitis, stasis 02/06/2015   Bilateral leg edema 02/06/2015   Chronic venous stasis dermatitis of both lower extremities 02/06/2015   Prediabetes 04/30/2014   Hyperlipidemia, mixed 09/05/2008   Hypertension 02/13/2008   Anxiety disorder 10/25/1998   Asthma, intrinsic 10/25/1968   PCP:  Malva Limes, MD Pharmacy:   [id number] - [place], [place] - [address]  [address] [place] [address] Phone: [phone] Fax: [phone]     Social Determinants of Health (SDOH) Social History: SDOH Screenings   Food Insecurity: No Food Insecurity (05/29/2023)  Housing: Patient Unable To Answer (06/01/2023)  Transportation Needs: No Transportation Needs (05/29/2023)  Utilities: Not At Risk (05/29/2023)  Alcohol Screen: Low Risk  (02/26/2022)  Depression (PHQ2-9): Medium Risk (02/26/2022)  Financial Resource Strain: Low Risk  (10/22/2021)  Physical Activity: Inactive (03/25/2020)  Social  Connections: Socially Isolated (03/25/2020)  Stress: No Stress Concern Present (03/25/2020)  Tobacco Use: Low Risk  (05/29/2023)   SDOH Interventions:     Readmission Risk Interventions     No data to display

## 2023-06-01 NOTE — Progress Notes (Signed)
Eeg done 

## 2023-06-01 NOTE — Progress Notes (Signed)
NAME:  Clarence Hamilton, MRN:  [id number], DOB:  Nov 29, 1955, LOS: 3 ADMISSION DATE:  05/29/2023, CONSULTATION DATE:  05/29/2023 REFERRING MD:  Donna Bernard, CHIEF COMPLAINT:  Altered Mental status    HPI  67 y.o male with significant PMH of Mild persistent Asthma/COPD On oxygen at 3 liters. recurrent flares, intolerant to steroids, HTN, HLD, Prediabetes,  Lumbar radiculopathy, chronic pain syndrome,anxiety and depression who presented to the ED with chief complaints of altered mental status.  Patient presented with c/o increasing anxiety and "dark thoughts". He report that he is ran out of his pain medication and other prescriptions for his depression. He is not a very good historian and is unclear what medications he is currently taking. He is prescribed Effexor which appeared to have been refilled on 04/21/23. He also takes Alprazolam and Buspar. Patient report that he takes medication as prescribed but unclear which medication he took today. He ran out of his pain meds and took some medications from his friend. Unclear what or how much he took. He is also reporting diarrhea and burning with urination.   ED Course: Initial vital signs showed HR of beats/minute, BP mm Hg, the RR 30 breaths/minute, and the oxygen saturation % on and a temperature of 98.20F (36.9C). Patient was lethargic, moaned intermittently, and responded with one-word answers; symmetric movement in the arms and legs was observed. Pertinent Labs/Diagnostics Findings: Na+/ K+125/<2.0, phosphorus <1.0, mag 1.6 Glucose: 147 BUN/Cr.:30/1.91 AST/ALT:48/73 WBC:19.6 Hgb/Hct: 11.1/30.2 Plts: 725 Lactic acid: 2.5 PCT <0.10 COVID PCR: Negative,  UDS+ Opiate, Benzo and cannabinoid UA+ UTI VBG: pO2 pend; pCO2 34; pH 7.54;  HCO3 29.1, %O2 Sat pend.  CXR> CTH> Negative CTA Chest> No acute finding CT Abd/pelvis>No acute finding  Patient given 30 cc/kg of fluids and started on broad-spectrum antibiotics Vancomycin, cefepime and Flagyl for  suspected sepsis secondary to suspected UTI. Patient remained altered and restless requiring benzos, Haldol and sitter at the bedside for safety. Due to persistent altered mental status, PCCM consulted.  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 recurrent flares, intolerant to steroids, HTN, HLD, Prediabetes,  Lumbar radiculopathy, chronic pain syndrome,anxiety and depression  Significant Hospital Events   8/4: Admitted ICU with altered mental status 8/5: Mental status improving, +remains critically low despite repletion 8/5: Mental status fluctuating with periods of restlessness now on precedex gtt, K+, phos and Mag remains critically low now with concerns for refeeding syndrome. 8/6: Patient noted with seizure like activity, CTH negative, EEG and neuro consult pending. Electrolytes improved 8/7:No seizures overnight, mental status improved. Off precedex, electrolytes improved Consults:  Psychiatric  Procedures:  None  Significant Diagnostic Tests:  8/4: Chest Xray>IMPRESSION: Low volumes.  No active cardiopulmonary disease.  8/4: Noncontrast CT head> IMPRESSION: No acute intracranial abnormality.  8/4: CTA Chest, abdomen and pelvis>IMPRESSION: 1. Examination is generally limited by patient and breath motion artifact throughout. 2. Within this limitation, no acute CT findings of the chest, abdomen, or pelvis to explain sepsis. 3. Tracheobronchomalacia. Diffuse bilateral bronchial wall thickening. Mosaic attenuation of the airspaces, suggestive of small airways disease. 4. Coronary artery disease. 5. Prostatomegaly.  Interim History / Subjective:    -Mental status improving  Micro Data:  8/4: SARS-CoV-2 PCR> negative 8/4: Influenza PCR> negative 8/4: Blood culture x2>Ecoli 8/4: Urine Culture> 8/4: MRSA PCR>>   Antimicrobials:  Vancomycin 05/29/23 x 1 Cefepime 05/29/23 x 1 Ceftriaxone 05/29/23 >>   OBJECTIVE  Blood pressure (!) 155/84,  pulse 86, temperature 98.1 F (36.7  C), temperature source Oral, resp. rate 20, height 5\' 8"  (1.727 m), weight 92.1 kg, SpO2 97%.        Intake/Output Summary (Last 24 hours) at 06/01/2023 0525 Last data filed at 06/01/2023 0400 Gross per 24 hour  Intake 1939.56 ml  Output 1700 ml  Net 239.56 ml   Filed Weights   05/29/23 2243 05/30/23 0412 05/31/23 0500  Weight: 91.3 kg 92 kg 92.1 kg   Physical Examination  GENERAL: 67 year-old critically ill patient lying in the bed in no acute distress EYES: PEERLA. No scleral icterus. Extraocular muscles intact.  HEENT: Head atraumatic, normocephalic. Oropharynx and nasopharynx clear.  NECK:  No JVD, supple  LUNGS: Normal breath sounds bilaterally.  No use of accessory muscles of respiration.  CARDIOVASCULAR: S1, S2 normal. No murmurs, rubs, or gallops.  ABDOMEN: Soft, NTND EXTREMITIES: No swelling or erythema.  Capillary refill > 3 seconds in all extremities. Pulses palpable distally. NEUROLOGIC: The patient is  alert and oriented x 3 with intermittent consfusion. No new or focal neurological deficit appreciated. Cranial nerves are intact.  SKIN: No obvious rash, lesion, or ulcer. Warm to touch Labs/imaging that I havepersonally reviewed  (right click and "Reselect all SmartList Selections" daily)     Labs   CBC: Recent Labs  Lab 05/29/23 1618 05/30/23 0319 05/31/23 0349 06/01/23 0412  WBC 19.6* 21.5* 12.5* 13.2*  HGB 11.1* 9.9* 8.4* 9.4*  HCT 30.2* 26.7* 22.8* 26.7*  MCV 82.5 81.4 82.9 86.1  PLT 725* 714* 570* 666*    Basic Metabolic Panel: Recent Labs  Lab 05/30/23 0319 05/30/23 1017 05/30/23 1714 05/30/23 2147 05/31/23 0349 05/31/23 1259 05/31/23 1632 06/01/23 0412  NA 128* 130* 130* 129* 131*  --  136 139  K <2.0* <2.0* 2.9* 2.3* 2.8* 3.4* 3.7 3.1*  CL 89* 93* 93* 93* 97*  --  101 102  CO2 23 23 26 26 26   --  24 27  GLUCOSE 113* 128* 113* 118* 116*  --  105* 116*  BUN 28* 23 22 21 19   --  18 21  CREATININE 1.80*  1.71* 1.51* 1.46* 1.28*  --  1.32* 1.24  CALCIUM 8.1* 7.8* 7.7* 7.6* 7.7*  --  8.9 8.7*  MG 1.6* 2.8*  --   --  2.3  --  2.2 2.2  PHOS <1.0*  --   --   --  3.3  --  2.6 3.0   GFR: Estimated Creatinine Clearance: 65.4 mL/min (by C-G formula based on SCr of 1.24 mg/dL). Recent Labs  Lab 05/29/23 1618 05/29/23 1816 05/29/23 1941 05/30/23 0319 05/31/23 0349 06/01/23 0412  PROCALCITON  --   --  <0.10  --   --   --   WBC 19.6*  --   --  21.5* 12.5* 13.2*  LATICACIDVEN  --  2.5* 2.2*  --   --   --     Liver Function Tests: Recent Labs  Lab 05/29/23 1618 05/30/23 0319 05/31/23 0349 06/01/23 0412  AST 48* 41 33 45*  ALT 73* 61* 46* 58*  ALKPHOS 101 86 72 70  BILITOT 0.7 1.1 0.6 0.9  PROT 7.3 6.8 5.6* 6.4*  ALBUMIN 3.5 3.3* 2.8* 3.2*   No results for input(s): "LIPASE", "AMYLASE" in the last 168 hours. Recent Labs  Lab 05/29/23 1816  AMMONIA 14    ABG    Component Value Date/Time   HCO3 29.1 (H) 05/29/2023 2000     Coagulation Profile: Recent Labs  Lab 05/29/23 1816  INR 1.1    Cardiac Enzymes: Recent Labs  Lab 05/29/23 1618  CKTOTAL 74    HbA1C: Hgb A1c MFr Bld  Date/Time Value Ref Range Status  05/30/2023 03:19 AM 5.6 4.8 - 5.6 % Final    Comment:    (NOTE) Pre diabetes:          5.7%-6.4%  Diabetes:              >6.4%  Glycemic control for   <7.0% adults with diabetes   02/26/2022 04:06 PM 5.9 (H) 4.8 - 5.6 % Final    Comment:             Prediabetes: 5.7 - 6.4          Diabetes: >6.4          Glycemic control for adults with diabetes: <7.0     CBG: Recent Labs  Lab 05/31/23 1249 05/31/23 1605 05/31/23 1952 05/31/23 2350 06/01/23 0344  GLUCAP 109* 100* 85 277* 101*    Review of Systems:   Unable to be obtained secondary to the patient's altered mental status status.    Past Medical History  He,  has a past medical history of Anxiety, Arthritis, Asthma, History of chicken pox, Hyperglycemia, Hyperlipidemia, Hypertension, and  Obesity.   Surgical History    Past Surgical History:  Procedure Laterality Date   HERNIA REPAIR  1970's   Inguinal hernia repair x 2    TONSILLECTOMY AND ADENOIDECTOMY  1960's     Social History   reports that he has never smoked. He has never used smokeless tobacco. He reports that he does not drink alcohol and does not use drugs.   Family History   His family history includes Breast cancer in his mother; Hypertension in his father and mother; Melanoma in his father; Parkinson's disease in his father; Throat cancer in his paternal grandmother.   Allergies Allergies  Allergen Reactions   Sulfa Antibiotics      Home Medications  Prior to Admission medications   Medication Sig Start Date End Date Taking? Authorizing Provider  albuterol (PROVENTIL) (2.5 MG/3ML) 0.083% nebulizer solution SMARTSIG:3 Milliliter(s) Via Nebulizer Every 6 Hours PRN 10/08/21   [provider]  albuterol (VENTOLIN HFA) 108 (90 Base) MCG/ACT inhaler Inhale into the lungs. 06/03/21   [provider]  alprazolam Prudy Feeler) 2 MG tablet TAKE 1 TABLET BY MOUTH FOUR TIMES DAILY 04/08/23   Malva Limes, MD  Azelastine-Fluticasone [phone] MCG/ACT SUSP Place 1 spray into both nostrils 2 (two) times daily.    [provider]  busPIRone (BUSPAR) 15 MG tablet TAKE 1 TABLET(15 MG) BY MOUTH TWICE DAILY 03/07/23   Malva Limes, MD  diltiazem (TIADYLT ER) 120 MG 24 hr capsule TAKE 1 CAPSULE(120 MG) BY MOUTH DAILY 05/04/23   Malva Limes, MD  fexofenadine [facility]) 180 MG tablet Take by mouth. 07/15/21 07/15/22  [provider]  furosemide (LASIX) 20 MG tablet TAKE 1 TABLET(20 MG) BY MOUTH DAILY AS NEEDED FOR FLUID RETENTION 12/30/22   Simmons-Robinson, Tawanna Cooler, MD  hydrochlorothiazide (HYDRODIURIL) 25 MG tablet TAKE 1 TABLET(25 MG) BY MOUTH DAILY 05/06/23   Malva Limes, MD  HYDROcodone-acetaminophen (NORCO) 10-325 MG tablet Take 1 tablet by mouth in the morning, at noon, and at  bedtime.  04/17/20   [provider]  ketoconazole (NIZORAL) 2 % cream Apply 1 application topically daily. 07/04/20   Malva Limes, MD  levalbuterol Pauline Aus) 1.25 MG/3ML nebulizer solution levalbuterol 1.25 mg/3 mL solution  for nebulization  TAKE 3 MLS BY NEBULIZATION EVERY 6 HOURS AS NEEDED FOR WHEEZING    [provider]  losartan (COZAAR) 100 MG tablet Take 1 tablet by mouth daily. 01/05/22   [provider]  meloxicam (MOBIC) 15 MG tablet TAKE 1 TABLET(15 MG) BY MOUTH DAILY 11/21/19   Malva Limes, MD  montelukast (SINGULAIR) 10 MG tablet Take 1 tablet by mouth daily. 02/18/10   [provider]  Multiple Vitamins-Minerals (CENTRUM SILVER PO) Take 1 tablet by mouth daily. 01/03/09   [provider]  mupirocin ointment (BACTROBAN) 2 % Apply 1 application topically daily. 06/10/20   Erasmo Downer, MD  naloxone [facility]) nasal spray 4 mg/0.1 mL Narcan 4 mg/actuation nasal spray  Take by nasal route every 3 minutes until patient awakes or EMS arrives.    [provider]  OXYGEN Place 3 L into the nose. At all times    [provider]  PROAIR HFA 108 [phone] Base) MCG/ACT inhaler Inhale 1 puff into the lungs every 6 (six) hours as needed.  02/01/16   [provider]  rosuvastatin (CRESTOR) 20 MG tablet Take 1 tablet (20 mg total) by mouth daily. 05/03/22   Malva Limes, MD  tamsulosin (FLOMAX) 0.4 MG CAPS capsule TAKE 1 CAPSULE(0.4 MG) BY MOUTH DAILY 06/21/22   Stoioff, Verna Czech, MD  triamcinolone cream (KENALOG) 0.1 % Apply 1 application topically 2 (two) times daily. 06/18/21   Malva Limes, MD  valACYclovir (VALTREX) 1000 MG tablet valacyclovir 1 gram tablet  TK 1 T PO TID Patient not taking: Reported on 05/29/2023    [provider]  venlafaxine XR (EFFEXOR-XR) 75 MG 24 hr capsule TAKE 3 CAPSULES(225 MG) BY MOUTH DAILY WITH BREAKFAST 04/21/23   Malva Limes, MD  Scheduled Meds:  Chlorhexidine Gluconate  Cloth  6 each Topical QHS   vitamin B-12  1,000 mcg Oral Daily   diazepam  2.5 mg Intravenous Q6H   enoxaparin (LOVENOX) injection  40 mg Subcutaneous Q24H   feeding supplement  237 mL Oral TID BM   insulin aspart  0-9 Units Subcutaneous Q4H   ipratropium-albuterol  3 mL Nebulization BID   multivitamin with minerals  1 tablet Oral Daily   potassium chloride  40 mEq Oral Once   thiamine (VITAMIN B1) injection  100 mg Intravenous Daily   Continuous Infusions:  dextrose 5 % and 0.45 % NaCl with KCl 20 mEq/L 75 mL/hr at 06/01/23 0400   PRN Meds:.docusate sodium, ipratropium-albuterol, polyethylene glycol   Active Hospital Problem list   See below  Assessment & Plan:  #Sepsis Secondary to UTI  UCx growing E. Coli, susceptibilities pending.  -F/u cultures, trend lactic/ PCT -Monitor WBC/ fever curve -Continue ceftriaxone -IVF hydration as needed -Pressors for MAP goal >65 -Strict I/O's    #Acute Toxic Metabolic Encephalopathy #Seizures Likely Multifactorial in the setting of sepsis, drug intoxication and ?refeeding syndrome UDS+ Opiates, Benzo and Cannabinoid, he has hx of chronic pain syndrome, anxiety and depression on Alprazolam, Buspar, Effexor and hydrocodone. Patient admitted that he ran out of his meds and took "some meds" from his friend, it is unknown what or how much he took -EKG shows prolonged QTC, he has AKI with electrolyte derangement hyponatremia and severe hypokalemia concerning for  refeeding syndrome, SNRI toxicity ? -CTH negative -CT Chest abd/pelvis negative -Seizure precautions -EEG pending -Neurology consult -Continue Keppra -Sodium Bicarb PRN for widened QRS -Supportive care with IVFs -Hold home meds for now -Avoid  sedatives as able  #Concerns for Refeeding syndrome~IMPROVED Patient present with Na+/ K+125/<2.0, phosphorus <1.0, mag 1.6 Given persistent severe Hypokalemia, hypophosphatemia, hypomagnesemia and delirious state in a patient who has has  recent significant weight loss >10% of body weight  per nephew and started feeding on "junk" there is high probability of Refeeding syndrome, patient also had a seizure 8/6 -Replete K/Mg/Phos aggressively (target K>4 mM and Mg >2 mg/dL, MWNU>2.7). -Thiamine 100 mg IV daily -Vitamin B12, 1000 mcg PO daily. -Daily multivitamin. -Dietary following  #AKI likely ATN in the setting of above~IMPROVED #Hypokalemia, Hyponatremia, Hypomagnesemia~IMPROVED -Monitor I&O's / urinary output -Follow serial BMP -Ensure adequate renal perfusion -Avoid nephrotoxic agents as able -Replace electrolytes as indicated  #Hyperglycemia -CBG's q4; Target range of 140 to 180 -SSI -Follow ICU Hypo/Hyperglycemia protocol   #HTN #HLD -Continue Crestor -Hold home losartan, hydrochlorothiazide, diltiazem and Lasix in the setting of AKI and hypotension   #Chronic Pain #Lumbar Radiculopathy  -Hold home meds due to altered mental status and suspected drug intoxication   #Anxiety and Depression -Hold home meds for now -Psych consult, pt now with hallucinations, "dark thoughts" denies suicide ideation, concerns for SSRI/SNRI toxicity although he denies taking his Effexor recently?  #Chronic Asthma/COPD with exacerbation -Supplemental O2 as needed to maintain O2 saturations 88 to 92% -As needed and scheduled bronchodilators -Continue Singulair    Best practice:  Diet:  Oral Pain/Anxiety/Delirium protocol (if indicated): No VAP protocol (if indicated): Not indicated DVT prophylaxis: Subcutaneous Heparin GI prophylaxis: PPI Glucose control:  SSI Yes Central venous access:  N/A Arterial line:  N/A Foley:  N/A Mobility:  bed rest  PT consulted: N/A Last date of multidisciplinary goals of care discussion [05/28/13] Code Status:  full code Disposition: ICU   = Goals of Care = Code Status Order: FULL  Primary Emergency Contact: Chrismon,Eddie Wishes to pursue full aggressive treatment and intervention  options, including CPR and intubation, but goals of care will be addressed on going with family if that should become necessary.  Critical care time: 45 minutes        Webb Silversmith DNP, CCRN, FNP-C, AGACNP-BC Acute Care & Family Nurse Practitioner [facility] PCCM on call pager [phone]    ICU ATTENDING ATTESTATION:  Patient seen and examined and relevant ancillary tests reviewed.   I agree with the assessment and plan of care as outlined by Webb Silversmith NP.     BP (!) 155/84   Pulse 86   Temp 98.1 F (36.7 C) (Oral)   Resp 20   Ht 5\' 8"  (1.727 m)   Wt 92.1 kg   SpO2 97%   BMI 30.87 kg/m   CBC    Component Value Date/Time   WBC 13.2 (H) 06/01/2023 0412   RBC 3.10 (L) 06/01/2023 0412   HGB 9.4 (L) 06/01/2023 0412   HGB 13.2 02/26/2022 1606   HCT 26.7 (L) 06/01/2023 0412   HCT 39.6 02/26/2022 1606   PLT 666 (H) 06/01/2023 0412   PLT 379 02/26/2022 1606   MCV 86.1 06/01/2023 0412   MCV 88 02/26/2022 1606   MCH 30.3 06/01/2023 0412   MCHC 35.2 06/01/2023 0412   RDW 14.7 06/01/2023 0412   RDW 13.3 02/26/2022 1606        Latest Ref Rng & Units 06/01/2023    4:12 AM 05/31/2023    4:32 PM 05/31/2023   12:59 PM  BMP  Glucose 70 - 99 mg/dL 742  595    BUN  8 - 23 mg/dL 21  18    Creatinine 0.86 - 1.24 mg/dL 5.78  4.69    Sodium 629 - 145 mmol/L 139  136    Potassium 3.5 - 5.1 mmol/L 3.1  3.7  3.4   Chloride 98 - 111 mmol/L 102  101    CO2 22 - 32 mmol/L 27  24    Calcium 8.9 - 10.3 mg/dL 8.7  8.9           Critical Care Time devoted to patient care services described in this note is 42 Total Care Time minutes.     Lucie Leather, M.D.  [facility]  Medical Director [facility] Medical Director [facility] Cardio-Pulmonary Department

## 2023-06-01 NOTE — Progress Notes (Addendum)
Nutrition Follow Up Note   DOCUMENTATION CODES:   Not applicable  INTERVENTION:   Ensure Enlive po TID, each supplement provides 350 kcal and 20 grams of protein.  Magic cup TID with meals, each supplement provides 290 kcal and 9 grams of protein  MVI po daily   Pt at high refeed risk; recommend monitor potassium, magnesium and phosphorus labs daily until stable  Daily weights   Check vitamins B12, B6 and D.   NUTRITION DIAGNOSIS:   Unintentional weight loss related to poor appetite as evidenced by 15 percent weight loss in 8 months.  GOAL:   Patient will meet greater than or equal to 90% of their needs -not met   MONITOR:   PO intake, Supplement acceptance, Labs, Weight trends, I & O's, Skin  ASSESSMENT:   67 y/o male with h/o anxiety, depression, HLD, HTN, ILD/COPD, CKD III, lumbar radiculopathy, chronic pain and substance abuse who is admitted with UTI, sepsis and AKI.  Pt more alert today. Pt with poor appetite and oral intake; pt eating only bites of meals. Pt is mostly drinking liquids per RN. Pt did drink 50% of an Ensure today. Pt is noted to have vomited after dinner yesterday. Pt with new seizure like activity noted yesterday; neurology following. Will check vitamin labs where deficiencies can lead to seizures. Pt remains at high refeed risk. Pt initiated on dextrose today; recommend monitor electrolytes.    Medications reviewed and include: B12, lovenox, insulin, MVI, thiamine, 5% dextrose @100ml /hr  Labs reviewed: K 3.1(L), P 3.0 wnl, Mg 2.2 wnl Wbc- 13.2(H), Hgb 9.4(L), Hct 26.7(L) Cbgs- 118, 149, 101 x 24 hrs   Diet Order:    Diet Order             Diet regular Room service appropriate? Yes; Fluid consistency: Thin  Diet effective now                  EDUCATION NEEDS:   Education needs have been addressed  Skin:  Skin Assessment: Reviewed RN Assessment  Last BM:  8/6- type 6  Height:   Ht Readings from Last 1 Encounters:  05/29/23 5'  8" (1.727 m)    Weight:   Wt Readings from Last 1 Encounters:  06/01/23 92.1 kg    Ideal Body Weight:  70 kg  BMI:  Body mass index is 30.87 kg/m.  Estimated Nutritional Needs:   Kcal:  1900-2200kcal/day  Protein:  95-110g/day  Fluid:  1.9-2.2L/day  Betsey Holiday MS, RD, LDN Please refer to [facility] for RD and/or RD on-call/weekend/after hours pager

## 2023-06-01 NOTE — Consult Note (Signed)
Neurology Consultation  Reason for Consult: Evaluate for seizures Referring Physician: Dr. Belia Heman  CC: Concern for seizures  History is obtained from: Chart, patient  HPI: Clarence Hamilton is a 67 y.o. male with past medical history of hypertension, hyperlipidemia, obesity, COPD on 3 L home oxygen, with recurrent flares of his COPD intolerant to steroids, lumbar radiculopathy, chronic pain, anxiety and depression presented to the emergency department with complaints of altered mental status.  Patient had been complaining of having dark thoughts and increased anxiety after running out of his pain medications and prescription medications for depression.  Later on family member conversation documented in the chart reports patient living in deplorable conditions at home.  Also patient lost a significant amount of weight in the last month due to not eating or drinking. He has been treated in the ICU for acute metabolic encephalopathy versus sepsis.  His UDS was positive for opiates, benzos and cannabinoids and he was unclear on what medications he ran out and what he was taking and how much. He also had AKI and nonanion gap metabolic acidosis with lactic acidosis and other metabolic derangements which were being treated in the ICU. In addition, concern for refeeding syndrome due to significant metabolic derangements, which also is being managed by the ICU. Yesterday, PCCM MD was called to evaluate the patient around 5 PM for acute twitching of the right side of the face and right hand along with left foot-she was able to follow commands through all of this.  He moved all his extremities with no weakness.  Labs were ordered.  CBG was 100.  Vital signs were stable.  His twitching became more pronounced and he became more somnolent in the next 30 minutes with seizure-like activity with arching of the back and posturing noted.  4 mg of Ativan was given and IV Keppra was ordered after the case was discussed with  me. He appears to be fully awake today and does not remember the episode from yesterday.  He was on home opiates and benzos.  Has been Valium was started yesterday.  ROS: Attempted but unable to obtain reliably due to altered mental status.   Past Medical History:  Diagnosis Date   Anxiety    Arthritis    Asthma    History of chicken pox    Hyperglycemia    Hyperlipidemia    Hypertension    Obesity      Family History  Problem Relation Age of Onset   Hypertension Mother    Breast cancer Mother    Melanoma Father    Hypertension Father    Parkinson's disease Father    Throat cancer Paternal Grandmother      Social History:   reports that he has never smoked. He has never used smokeless tobacco. He reports that he does not drink alcohol and does not use drugs.  Medications  Current Facility-Administered Medications:    Chlorhexidine Gluconate Cloth 2 % PADS 6 each, 6 each, Topical, QHS, Kasa, Kurian, MD, 6 each at 05/31/23 2230   cyanocobalamin (VITAMIN B12) tablet 1,000 mcg, 1,000 mcg, Oral, Daily, Ouma, Hubbard Hartshorn, NP, 1,000 mcg at 06/01/23 0811   dextrose 5% in lactated ringers with KCl 20 mEq/L infusion, , Intravenous, Continuous, Ouma, Hubbard Hartshorn, NP, Last Rate: 100 mL/hr at 06/01/23 0605, New Bag at 06/01/23 0605   diazepam (VALIUM) injection 2.5 mg, 2.5 mg, Intravenous, Q6H, Kasa, Kurian, MD, 2.5 mg at 06/01/23 0817   docusate sodium (COLACE) capsule 100 mg,  100 mg, Oral, BID PRN, Jimmye Norman, NP   enoxaparin (LOVENOX) injection 40 mg, 40 mg, Subcutaneous, Q24H, Belia Heman, Kurian, MD, 40 mg at 05/31/23 2156   feeding supplement (ENSURE ENLIVE / ENSURE PLUS) liquid 237 mL, 237 mL, Oral, TID BM, Belia Heman, Kurian, MD, 237 mL at 05/31/23 2040   insulin aspart (novoLOG) injection 0-9 Units, 0-9 Units, Subcutaneous, Q4H, Jimmye Norman, NP, 1 Units at 06/01/23 3664   ipratropium-albuterol (DUONEB) 0.5-2.5 (3) MG/3ML nebulizer solution 3 mL, 3 mL,  Nebulization, Q6H PRN, Jimmye Norman, NP, 3 mL at 05/30/23 1951   ipratropium-albuterol (DUONEB) 0.5-2.5 (3) MG/3ML nebulizer solution 3 mL, 3 mL, Nebulization, BID, Belia Heman, Wallis Bamberg, MD, 3 mL at 06/01/23 0716   multivitamin with minerals tablet 1 tablet, 1 tablet, Oral, Daily, Erin Fulling, MD, 1 tablet at 06/01/23 0811   polyethylene glycol (MIRALAX / GLYCOLAX) packet 17 g, 17 g, Oral, Daily PRN, Jimmye Norman, NP   potassium chloride 10 mEq in 100 mL IVPB, 10 mEq, Intravenous, Q1 Hr x 2, Ouma, Hubbard Hartshorn, NP, Last Rate: 100 mL/hr at 06/01/23 0824, 10 mEq at 06/01/23 0824   potassium chloride SA (KLOR-CON M) CR tablet 40 mEq, 40 mEq, Oral, Once, Agbata, Tochukwu, MD   thiamine (VITAMIN B1) injection 100 mg, 100 mg, Intravenous, Daily, Jimmye Norman, NP, 100 mg at 06/01/23 4034   Exam: Current vital signs: BP (!) 156/87   Pulse 85   Temp 97.9 F (36.6 C) (Axillary)   Resp 16   Ht 5\' 8"  (1.727 m)   Wt 92.1 kg   SpO2 97%   BMI 30.87 kg/m  Vital signs in last 24 hours: Temp:  [97.9 F (36.6 C)-98.3 F (36.8 C)] 97.9 F (36.6 C) (08/07 0400) Pulse Rate:  [49-115] 85 (08/07 0600) Resp:  [14-31] 16 (08/07 0600) BP: (121-167)/(60-140) 156/87 (08/07 0600) SpO2:  [91 %-100 %] 97 % (08/07 0600) Weight:  [92.1 kg] 92.1 kg (08/07 0500) General: Sickly looking man in bed HEENT: Normocephalic atraumatic, left conjunctival erythema CVS regular rate rhythm Respiratory: Infrequent scattered rales, saturating normally on room air. Abdomen nondistended nontender Neurological exam He is awake alert oriented to self and the fact that he is in the hospital Has poor attention concentration No dysarthria No aphasia Cranial nerves II to XII intact Motor examination with no drift but has asterixis on outstretched arms.  No rigidity.  Normal tone Sensation intact Coordination-asterixis as above.  No gross dysmetria DTRs: Mildly hyperreflexic but no  clonus  Labs I have reviewed labs in epic and the results pertinent to this consultation are: Continues to have mild leukocytosis, hypokalemia and mildly deranged AST ALT. CBC    Component Value Date/Time   WBC 13.2 (H) 06/01/2023 0412   RBC 3.10 (L) 06/01/2023 0412   HGB 9.4 (L) 06/01/2023 0412   HGB 13.2 02/26/2022 1606   HCT 26.7 (L) 06/01/2023 0412   HCT 39.6 02/26/2022 1606   PLT 666 (H) 06/01/2023 0412   PLT 379 02/26/2022 1606   MCV 86.1 06/01/2023 0412   MCV 88 02/26/2022 1606   MCH 30.3 06/01/2023 0412   MCHC 35.2 06/01/2023 0412   RDW 14.7 06/01/2023 0412   RDW 13.3 02/26/2022 1606    CMP     Component Value Date/Time   NA 139 06/01/2023 0412   NA 136 02/26/2022 1606   K 3.1 (L) 06/01/2023 0412   CL 102 06/01/2023 0412   CO2 27 06/01/2023 0412   GLUCOSE 116 (H)  06/01/2023 0412   BUN 21 06/01/2023 0412   BUN 21 02/26/2022 1606   CREATININE 1.24 06/01/2023 0412   CREATININE 1.05 10/13/2017 1447   CALCIUM 8.7 (L) 06/01/2023 0412   PROT 6.4 (L) 06/01/2023 0412   PROT 7.4 02/26/2022 1606   ALBUMIN 3.2 (L) 06/01/2023 0412   ALBUMIN 4.6 02/26/2022 1606   AST 45 (H) 06/01/2023 0412   ALT 58 (H) 06/01/2023 0412   ALKPHOS 70 06/01/2023 0412   BILITOT 0.9 06/01/2023 0412   BILITOT 0.5 02/26/2022 1606   GFRNONAA >60 06/01/2023 0412   GFRNONAA 77 10/13/2017 1447   GFRAA 79 02/20/2020 1037   GFRAA 89 10/13/2017 1447   Ammonia level on 05/29/2023 was normal. TSH 1.2 HIV negative  Imaging I have reviewed the images obtained:  CT-head done yesterday-no acute intracranial abnormality.  Assessment:  67 year old man past history of hypertension hyperlipidemia obesity, COPD on 3 L home oxygen with recurrent flares of his COPD intolerant to steroids, lumbar radiculopathy, chronic pain anxiety and depression presented for evaluation of altered mental status going on for a few days to weeks as well as unintentional weight loss-nearly 10% of his body weight in less than a  month.  Noted to have lactic acidosis, sepsis as well as concerns for refeeding syndrome due to his multiple metabolic derangements. Yesterday there was concern for seizure-like activity for which she was given benzos and loaded with Keppra. On examination, he does appear to be somewhat encephalopathic but is otherwise nonfocal.  There is though asterixis on outstretched arms which could be multifactorial due to underlying metabolic derangements. His examination does not look very consistent with serotonin syndrome.  Impression: Toxic metabolic encephalopathy, seizure activity   Recommendations: I would hold off on antiepileptics for right now I would recommend getting an EEG I would recommend getting an MRI of the brain without contrast If any of the above studies show any abnormality or he has recurrence of seizure-like activity, will consider antibiotics at that time. Also will recommend checking B12, ammonia, RPR hepatitis panel Start him back on his home benzodiazepines. Pain management per primary team Management of underlying UTI/sepsis per primary team I will continue to follow.  -- Milon Dikes, MD Neurologist [facility] Pager: [phone]

## 2023-06-01 NOTE — Plan of Care (Signed)
  Problem: Education: Goal: Ability to describe self-care measures that may prevent or decrease complications (Diabetes Survival Skills Education) will improve Outcome: Progressing   Problem: Coping: Goal: Ability to adjust to condition or change in health will improve Outcome: Progressing   Problem: Fluid Volume: Goal: Ability to maintain a balanced intake and output will improve Outcome: Progressing   Problem: Health Behavior/Discharge Planning: Goal: Ability to identify and utilize available resources and services will improve Outcome: Progressing   Problem: Metabolic: Goal: Ability to maintain appropriate glucose levels will improve Outcome: Progressing   Problem: Nutritional: Goal: Maintenance of adequate nutrition will improve Outcome: Progressing   Problem: Skin Integrity: Goal: Risk for impaired skin integrity will decrease Outcome: Progressing   Problem: Education: Goal: Knowledge of General Education information will improve Description: Including pain rating scale, medication(s)/side effects and non-pharmacologic comfort measures Outcome: Progressing   Problem: Clinical Measurements: Goal: Will remain free from infection Outcome: Progressing

## 2023-06-01 NOTE — Plan of Care (Signed)
  Problem: Coping: Goal: Ability to adjust to condition or change in health will improve Outcome: Progressing   Problem: Education: Goal: Knowledge of General Education information will improve Description: Including pain rating scale, medication(s)/side effects and non-pharmacologic comfort measures Outcome: Progressing   Problem: Pain Managment: Goal: General experience of comfort will improve Outcome: Progressing   Problem: Skin Integrity: Goal: Risk for impaired skin integrity will decrease Outcome: Progressing

## 2023-06-01 NOTE — Progress Notes (Signed)
PHARMACY CONSULT NOTE  Pharmacy Consult for Electrolyte Monitoring and Replacement   Recent Labs: Potassium (mmol/L)  Date Value  06/01/2023 3.1 (L)   Magnesium (mg/dL)  Date Value  64/40/3474 2.2   Calcium (mg/dL)  Date Value  25/95/6387 8.7 (L)   Albumin (g/dL)  Date Value  56/43/3295 3.2 (L)  02/26/2022 4.6   Phosphorus (mg/dL)  Date Value  18/84/1660 3.0   Sodium (mmol/L)  Date Value  06/01/2023 139  02/26/2022 136   Assessment: Clarence Hamilton is a 67 y.o. male with history of asthma / COPD on 3L O2, steroid intolerance, HTN, HLD, prediabetes, lumbar radiculopathy, chronic pain syndrome, anxiety / depression admitted with acute metabolic encephalopathy and severe electrolyte derangements.  MIVF: D5LR + 20 mEq K/L at 100 cc/hr  Goal of Therapy:  Electrolytes within normal limits  Plan:  --K 3.1, continue MIVF as above and give Kcl 10 mEq q1h x 2 doses per provider --Patient care transferred from PCCM to [facility]. Will discontinue electrolyte consult at this time. Defer further ordering of labs and electrolyte replacement to primary team --Pharmacy will continue to follow along peripherally  Tressie Ellis 06/01/2023 7:46 AM

## 2023-06-01 NOTE — Progress Notes (Signed)
Pt transported to MRI, per MD okay to travel without RN.

## 2023-06-01 NOTE — Progress Notes (Signed)
Progress Note   Patient: Clarence Hamilton UEA:[id number] DOB: January 09, 1956 DOA: 05/29/2023     3 DOS: the patient was seen and examined on 06/01/2023   Brief hospital course:  67 y.o male with significant PMH of Mild persistent Asthma/COPD On oxygen at 3 liters. recurrent flares, intolerant to steroids, HTN, HLD, Prediabetes,  Lumbar radiculopathy, chronic pain syndrome,anxiety and depression who presented to the ED with chief complaints of altered mental status.   Patient presented with c/o increasing anxiety and "dark thoughts". He report that he is ran out of his pain medication and other prescriptions for his depression. He is not a very good historian and is unclear what medications he is currently taking. He is prescribed Effexor which appeared to have been refilled on 04/21/23. He also takes Alprazolam and Buspar. Patient report that he takes medication as prescribed but unclear which medication he took today. He is also reporting diarrhea and burning with urination.   ED Course: Initial vital signs showed HR of beats/minute, BP mm Hg, the RR 30 breaths/minute, and the oxygen saturation % on and a temperature of 98.68F (36.9C). Patient was lethargic, moaned intermittently, and responded with one-word answers; symmetric movement in the arms and legs was observed. Pertinent Labs/Diagnostics Findings: Na+/ K+125/2.0  Glucose: 147 BUN/Cr.:30/1.91 AST/ALT:48/73 WBC:19.6 Hgb/Hct: 11.1/30.2 Plts: 725 Lactic acid: 2.5 PCT <0.10 COVID PCR: Negative,  UDS+ Opiate, Benzo and cannabinoid UA+ UTI VBG: pO2 pend; pCO2 34; pH 7.54;  HCO3 29.1, %O2 Sat pend.  CXR> CTH> Negative CTA Chest> No acute finding CT Abd/pelvis>No acute finding   Patient given 30 cc/kg of fluids and started on broad-spectrum antibiotics Vancomycin, cefepime and Flagyl for suspected sepsis secondary to suspected UTI. Patient remained altered and restless requiring benzos, Haldol and sitter at the bedside for safety. Due to persistent  altered mental status, PCCM consulted. Patient's care transferred to [facility] on 06/01/23  8/4: Admitted ICU with altered mental status 8/5: Mental status improving, +remains critically low despite repletion 8/5: Mental status fluctuating with periods of restlessness now on precedex gtt, K+, phos and Mag remains critically low now with concerns for refeeding syndrome. 8/6: Patient noted with seizure like activity, CTH negative, EEG and neuro consult pending. Electrolytes improved 8/7:No seizures overnight, mental status improved. Off precedex, electrolytes improved, patient is more awake and alert and is oriented to person and place but not to time.  No further seizure activity noted.  EEG negative per neurology   Assessment and Plan:  Sepsis secondary to UTI Urine culture yields about 10,000 colonies of E. Coli Unclear significance Patient has completed a 3-day course of IV Rocephin    Acute toxic metabolic encephalopathy Seizures Likely Multifactorial in the setting of sepsis, drug intoxication and ?refeeding syndrome UDS+ Opiates, Benzo and Cannabinoid, he has hx of chronic pain syndrome, anxiety and depression on Alprazolam, Buspar, Effexor and hydrocodone. Patient admitted that he ran out of his meds and took "some meds" from his friend, it is unknown what or how much he took. Had seizure-like activity on 08/06.  Received 4 mg of Ativan and IV Keppra.  Seen by neurology and has a negative EEG.  Neurology recommends holding off on further AED for now and recommends an MRI of the brain for further evaluation.  If patient has any recurrence of seizure-like activity or imaging shows any abnormality the patient will be started back on antiepileptic agent.    AKI Hypokalemia/hypomagnesemia/hyponatremia Most likely secondary to diuretic therapy as patient was on hydrochlorothiazide prior to  admission Supplemented    Anxiety and depression Psych consult.  Patient with  hallucinations, dark thoughts but denies having any suicidal or homicidal ideation    COPD with acute exacerbation Continue scheduled and as needed bronchodilator therapy Continue Singulair Oxygen as needed     Lumbar radiculopathy Chronic pain syndrome  Hold off on pain meds for now until mental status improves.      Subjective: Patient is seen and examined at the bedside.  He denies having any pain.  He is oriented to person and place  Physical Exam: Vitals:   06/01/23 1100 06/01/23 1200 06/01/23 1300 06/01/23 1400  BP: (!) 160/84 (!) 172/84 (!) 158/87 (!) 161/87  Pulse: 83 88 69 86  Resp: (!) 26 (!) 23 (!) 28 (!) 28  Temp:  98.5 F (36.9 C)    TempSrc:  Oral    SpO2: 98% 98% 99% 99%  Weight:      Height:       GENERAL: 67 year-old critically ill patient lying in the bed in no acute distress EYES: PEERLA. No scleral icterus. Extraocular muscles intact.  HEENT: Head atraumatic, normocephalic. Oropharynx and nasopharynx clear.  NECK:  No JVD, supple  LUNGS: Normal breath sounds bilaterally.  No use of accessory muscles of respiration.  CARDIOVASCULAR: S1, S2 normal. No murmurs, rubs, or gallops.  ABDOMEN: Soft, NTND EXTREMITIES: No swelling or erythema.  Capillary refill > 3 seconds in all extremities. Pulses palpable distally. NEUROLOGIC: The patient is  alert and oriented x 3 with intermittent consfusion. No new or focal neurological deficit appreciated. Cranial nerves are intact.  SKIN: No obvious rash, lesion, or ulcer. Warm to touch   Data Reviewed: Labs reviewed. Potassium 3.1 There are no new results to review at this time.  Family Communication: None  Disposition: Status is: Inpatient Remains inpatient appropriate because: Not back to baseline mental status yet  Planned Discharge Destination:  TBD    Time spent: 35 minutes  Author: Lucile Shutters, MD 06/01/2023 2:44 PM  For on call review [email or web address].

## 2023-06-02 ENCOUNTER — Encounter (HOSPITAL_COMMUNITY): Payer: Self-pay

## 2023-06-02 ENCOUNTER — Inpatient Hospital Stay: Payer: Medicare HMO

## 2023-06-02 ENCOUNTER — Inpatient Hospital Stay (HOSPITAL_COMMUNITY)
Admission: EM | Admit: 2023-06-02 | Discharge: 2023-06-14 | DRG: 917 | Disposition: A | Discharge status: Home Health | Payer: Medicare HMO | Source: Other Acute Inpatient Hospital | Attending: Internal Medicine | Admitting: Internal Medicine

## 2023-06-02 ENCOUNTER — Inpatient Hospital Stay (HOSPITAL_COMMUNITY): Payer: Medicare HMO

## 2023-06-02 DIAGNOSIS — R569 Unspecified convulsions: Secondary | ICD-10-CM | POA: Diagnosis not present

## 2023-06-02 DIAGNOSIS — Z803 Family history of malignant neoplasm of breast: Secondary | ICD-10-CM

## 2023-06-02 DIAGNOSIS — D75839 Thrombocytosis, unspecified: Secondary | ICD-10-CM | POA: Diagnosis present

## 2023-06-02 DIAGNOSIS — F13129 Sedative, hypnotic or anxiolytic abuse with intoxication, unspecified: Secondary | ICD-10-CM | POA: Diagnosis present

## 2023-06-02 DIAGNOSIS — J9611 Chronic respiratory failure with hypoxia: Secondary | ICD-10-CM | POA: Diagnosis not present

## 2023-06-02 DIAGNOSIS — G40909 Epilepsy, unspecified, not intractable, without status epilepticus: Secondary | ICD-10-CM | POA: Diagnosis not present

## 2023-06-02 DIAGNOSIS — Z882 Allergy status to sulfonamides status: Secondary | ICD-10-CM

## 2023-06-02 DIAGNOSIS — G928 Other toxic encephalopathy: Secondary | ICD-10-CM | POA: Diagnosis present

## 2023-06-02 DIAGNOSIS — D649 Anemia, unspecified: Secondary | ICD-10-CM | POA: Diagnosis present

## 2023-06-02 DIAGNOSIS — Z6832 Body mass index (BMI) 32.0-32.9, adult: Secondary | ICD-10-CM

## 2023-06-02 DIAGNOSIS — F32A Depression, unspecified: Secondary | ICD-10-CM | POA: Diagnosis present

## 2023-06-02 DIAGNOSIS — G894 Chronic pain syndrome: Secondary | ICD-10-CM | POA: Diagnosis present

## 2023-06-02 DIAGNOSIS — B9620 Unspecified Escherichia coli [E. coli] as the cause of diseases classified elsewhere: Secondary | ICD-10-CM | POA: Diagnosis not present

## 2023-06-02 DIAGNOSIS — E782 Mixed hyperlipidemia: Secondary | ICD-10-CM

## 2023-06-02 DIAGNOSIS — R410 Disorientation, unspecified: Secondary | ICD-10-CM | POA: Diagnosis not present

## 2023-06-02 DIAGNOSIS — R5381 Other malaise: Secondary | ICD-10-CM | POA: Diagnosis present

## 2023-06-02 DIAGNOSIS — E876 Hypokalemia: Secondary | ICD-10-CM | POA: Diagnosis not present

## 2023-06-02 DIAGNOSIS — Z634 Disappearance and death of family member: Secondary | ICD-10-CM

## 2023-06-02 DIAGNOSIS — E872 Acidosis, unspecified: Secondary | ICD-10-CM | POA: Diagnosis not present

## 2023-06-02 DIAGNOSIS — E440 Moderate protein-calorie malnutrition: Secondary | ICD-10-CM | POA: Insufficient documentation

## 2023-06-02 DIAGNOSIS — R269 Unspecified abnormalities of gait and mobility: Secondary | ICD-10-CM | POA: Diagnosis present

## 2023-06-02 DIAGNOSIS — A4151 Sepsis due to Escherichia coli [E. coli]: Secondary | ICD-10-CM | POA: Diagnosis not present

## 2023-06-02 DIAGNOSIS — F419 Anxiety disorder, unspecified: Secondary | ICD-10-CM | POA: Diagnosis not present

## 2023-06-02 DIAGNOSIS — Z79899 Other long term (current) drug therapy: Secondary | ICD-10-CM

## 2023-06-02 DIAGNOSIS — B372 Candidiasis of skin and nail: Secondary | ICD-10-CM | POA: Diagnosis not present

## 2023-06-02 DIAGNOSIS — J4530 Mild persistent asthma, uncomplicated: Secondary | ICD-10-CM | POA: Diagnosis present

## 2023-06-02 DIAGNOSIS — A419 Sepsis, unspecified organism: Secondary | ICD-10-CM | POA: Diagnosis not present

## 2023-06-02 DIAGNOSIS — E785 Hyperlipidemia, unspecified: Secondary | ICD-10-CM | POA: Diagnosis present

## 2023-06-02 DIAGNOSIS — N390 Urinary tract infection, site not specified: Secondary | ICD-10-CM | POA: Diagnosis not present

## 2023-06-02 DIAGNOSIS — Z808 Family history of malignant neoplasm of other organs or systems: Secondary | ICD-10-CM

## 2023-06-02 DIAGNOSIS — F0394 Unspecified dementia, unspecified severity, with anxiety: Secondary | ICD-10-CM | POA: Diagnosis present

## 2023-06-02 DIAGNOSIS — E669 Obesity, unspecified: Secondary | ICD-10-CM | POA: Diagnosis present

## 2023-06-02 DIAGNOSIS — M5416 Radiculopathy, lumbar region: Secondary | ICD-10-CM | POA: Diagnosis present

## 2023-06-02 DIAGNOSIS — H109 Unspecified conjunctivitis: Secondary | ICD-10-CM | POA: Diagnosis present

## 2023-06-02 DIAGNOSIS — Z9981 Dependence on supplemental oxygen: Secondary | ICD-10-CM | POA: Diagnosis not present

## 2023-06-02 DIAGNOSIS — R4182 Altered mental status, unspecified: Secondary | ICD-10-CM | POA: Diagnosis present

## 2023-06-02 DIAGNOSIS — J449 Chronic obstructive pulmonary disease, unspecified: Secondary | ICD-10-CM | POA: Diagnosis present

## 2023-06-02 DIAGNOSIS — E871 Hypo-osmolality and hyponatremia: Secondary | ICD-10-CM | POA: Diagnosis not present

## 2023-06-02 DIAGNOSIS — Z8249 Family history of ischemic heart disease and other diseases of the circulatory system: Secondary | ICD-10-CM

## 2023-06-02 DIAGNOSIS — I10 Essential (primary) hypertension: Secondary | ICD-10-CM | POA: Diagnosis present

## 2023-06-02 DIAGNOSIS — T424X1A Poisoning by benzodiazepines, accidental (unintentional), initial encounter: Principal | ICD-10-CM | POA: Diagnosis present

## 2023-06-02 DIAGNOSIS — R29818 Other symptoms and signs involving the nervous system: Secondary | ICD-10-CM | POA: Diagnosis not present

## 2023-06-02 DIAGNOSIS — I6523 Occlusion and stenosis of bilateral carotid arteries: Secondary | ICD-10-CM | POA: Diagnosis not present

## 2023-06-02 DIAGNOSIS — G929 Unspecified toxic encephalopathy: Secondary | ICD-10-CM | POA: Diagnosis not present

## 2023-06-02 DIAGNOSIS — T424X5A Adverse effect of benzodiazepines, initial encounter: Secondary | ICD-10-CM | POA: Diagnosis present

## 2023-06-02 DIAGNOSIS — Z820 Family history of epilepsy and other diseases of the nervous system: Secondary | ICD-10-CM

## 2023-06-02 LAB — GLUCOSE, CAPILLARY
Glucose-Capillary: 100 mg/dL — ABNORMAL HIGH (ref 70–99)
Glucose-Capillary: 115 mg/dL — ABNORMAL HIGH (ref 70–99)
Glucose-Capillary: 103 mg/dL — ABNORMAL HIGH (ref 70–99)
Glucose-Capillary: 103 mg/dL — ABNORMAL HIGH (ref 70–99)
Glucose-Capillary: 117 mg/dL — ABNORMAL HIGH (ref 70–99)

## 2023-06-02 MED ORDER — LABETALOL HCL 5 MG/ML IV SOLN: 20.0000 mg | INTRAVENOUS | Status: DC | PRN

## 2023-06-02 MED ORDER — ROSUVASTATIN CALCIUM 20 MG PO TABS
20.0000 mg | ORAL_TABLET | Freq: Every day | ORAL | Status: DC
  Administered 2023-06-03 – 2023-06-14 (×12): 20 mg via ORAL
  Filled 2023-06-02 (×12): qty 1

## 2023-06-02 MED ORDER — MONTELUKAST SODIUM 10 MG PO TABS
10.0000 mg | ORAL_TABLET | Freq: Every day | ORAL | Status: DC
  Administered 2023-06-02 – 2023-06-13 (×12): 10 mg via ORAL
  Filled 2023-06-02 (×12): qty 1

## 2023-06-02 MED ORDER — ALBUTEROL SULFATE (2.5 MG/3ML) 0.083% IN NEBU: 2.5000 mg | INHALATION_SOLUTION | RESPIRATORY_TRACT | Status: DC | PRN

## 2023-06-02 MED ORDER — ENOXAPARIN SODIUM 40 MG/0.4ML IJ SOSY
40.0000 mg | PREFILLED_SYRINGE | INTRAMUSCULAR | Status: DC
  Administered 2023-06-03 – 2023-06-14 (×12): 40 mg via SUBCUTANEOUS
  Filled 2023-06-02 (×12): qty 0.4

## 2023-06-02 MED ORDER — DILTIAZEM HCL ER COATED BEADS 120 MG PO CP24
120.0000 mg | ORAL_CAPSULE | Freq: Every day | ORAL | Status: DC
  Administered 2023-06-03 – 2023-06-14 (×12): 120 mg via ORAL
  Filled 2023-06-02 (×12): qty 1

## 2023-06-02 MED ORDER — ALPRAZOLAM 0.5 MG PO TABS
1.0000 mg | ORAL_TABLET | Freq: Once | ORAL | Status: AC
  Administered 2023-06-02: 1 mg via ORAL
  Filled 2023-06-02: qty 2

## 2023-06-02 MED ORDER — POLYETHYLENE GLYCOL 3350 17 G PO PACK: 17.0000 g | PACK | Freq: Every day | ORAL | Status: DC | PRN

## 2023-06-02 MED ORDER — ALPRAZOLAM 0.5 MG PO TABS
2.0000 mg | ORAL_TABLET | Freq: Four times a day (QID) | ORAL | Status: DC
  Administered 2023-06-02 – 2023-06-03 (×2): 2 mg via ORAL
  Filled 2023-06-02 (×2): qty 4

## 2023-06-02 MED ORDER — INSULIN ASPART 100 UNIT/ML IJ SOLN: 0.0000 [IU] | Freq: Every day | INTRAMUSCULAR | Status: DC

## 2023-06-02 MED ORDER — LOSARTAN POTASSIUM 50 MG PO TABS
100.0000 mg | ORAL_TABLET | Freq: Every day | ORAL | Status: DC
  Administered 2023-06-03 – 2023-06-14 (×12): 100 mg via ORAL
  Filled 2023-06-02 (×13): qty 2

## 2023-06-02 MED ORDER — ACETAMINOPHEN 325 MG PO TABS: 650.0000 mg | ORAL_TABLET | Freq: Four times a day (QID) | ORAL | Status: DC | PRN

## 2023-06-02 MED ORDER — SODIUM CHLORIDE 0.9 % IV SOLN: INTRAVENOUS | Status: DC

## 2023-06-02 MED ORDER — LACTATED RINGERS IV BOLUS
1000.0000 mL | Freq: Once | INTRAVENOUS | Status: AC
  Administered 2023-06-03: 1000 mL via INTRAVENOUS

## 2023-06-02 MED ORDER — NAPHAZOLINE-GLYCERIN 0.012-0.25 % OP SOLN
1.0000 [drp] | OPHTHALMIC | Status: DC
  Administered 2023-06-02: 2 [drp] via OPHTHALMIC
  Administered 2023-06-03: 1 [drp] via OPHTHALMIC
  Administered 2023-06-03 – 2023-06-09 (×36): 2 [drp] via OPHTHALMIC
  Administered 2023-06-09 – 2023-06-10 (×4): 1 [drp] via OPHTHALMIC
  Administered 2023-06-10 (×2): 2 [drp] via OPHTHALMIC
  Administered 2023-06-11 (×2): 1 [drp] via OPHTHALMIC
  Administered 2023-06-11 (×3): 2 [drp] via OPHTHALMIC
  Administered 2023-06-11: 1 [drp] via OPHTHALMIC
  Administered 2023-06-11: 2 [drp] via OPHTHALMIC
  Administered 2023-06-12: 1 [drp] via OPHTHALMIC
  Administered 2023-06-12: 2 [drp] via OPHTHALMIC
  Administered 2023-06-12: 1 [drp] via OPHTHALMIC
  Administered 2023-06-12 (×3): 2 [drp] via OPHTHALMIC
  Administered 2023-06-13: 1 [drp] via OPHTHALMIC
  Administered 2023-06-13 – 2023-06-14 (×9): 2 [drp] via OPHTHALMIC
  Filled 2023-06-02: qty 15

## 2023-06-02 MED ORDER — INSULIN ASPART 100 UNIT/ML IJ SOLN
0.0000 [IU] | Freq: Three times a day (TID) | INTRAMUSCULAR | Status: DC
  Administered 2023-06-04 – 2023-06-05 (×2): 2 [IU] via SUBCUTANEOUS
  Administered 2023-06-07: 3 [IU] via SUBCUTANEOUS
  Administered 2023-06-08: 2 [IU] via SUBCUTANEOUS
  Administered 2023-06-09: 3 [IU] via SUBCUTANEOUS
  Administered 2023-06-10: 5 [IU] via SUBCUTANEOUS
  Administered 2023-06-11 – 2023-06-13 (×4): 2 [IU] via SUBCUTANEOUS

## 2023-06-02 MED ORDER — THIAMINE MONONITRATE 100 MG PO TABS
100.0000 mg | ORAL_TABLET | Freq: Every day | ORAL | Status: DC
  Administered 2023-06-02: 100 mg via ORAL
  Filled 2023-06-02: qty 1

## 2023-06-02 MED ORDER — LOSARTAN POTASSIUM 50 MG PO TABS
100.0000 mg | ORAL_TABLET | Freq: Every day | ORAL | Status: DC
  Administered 2023-06-02: 100 mg via ORAL
  Filled 2023-06-02: qty 2

## 2023-06-02 MED ORDER — ACETAMINOPHEN 650 MG RE SUPP: 650.0000 mg | Freq: Four times a day (QID) | RECTAL | Status: DC | PRN

## 2023-06-02 MED ORDER — LORAZEPAM 2 MG/ML IJ SOLN: 1.0000 mg | INTRAMUSCULAR | Status: DC | PRN

## 2023-06-02 MED ORDER — SODIUM CHLORIDE 0.9% FLUSH
3.0000 mL | Freq: Two times a day (BID) | INTRAVENOUS | Status: DC
  Administered 2023-06-02 – 2023-06-14 (×22): 3 mL via INTRAVENOUS

## 2023-06-02 MED ORDER — HYDROCHLOROTHIAZIDE 12.5 MG PO TABS
12.5000 mg | ORAL_TABLET | Freq: Every day | ORAL | Status: DC
  Administered 2023-06-03 – 2023-06-14 (×12): 12.5 mg via ORAL
  Filled 2023-06-02 (×13): qty 1

## 2023-06-02 MED ORDER — HYDRALAZINE HCL 20 MG/ML IJ SOLN: 10.0000 mg | INTRAMUSCULAR | Status: DC | PRN

## 2023-06-02 NOTE — Evaluation (Addendum)
Occupational Therapy Evaluation Patient Details Name: Clarence Hamilton MRN: [id number] DOB: 1956-02-21 Today's Date: 06/02/2023   History of Present Illness Pt is a 67 year old male admitted with sepsis secondary to UTI, Acute toxic metabolic encephalopathy, Seizures, AKI      Pmh significant for asthma/COPD On oxygen at 3 liters. recurrent flares, intolerant to steroids, HTN, HLD, Prediabetes,  Lumbar radiculopathy, chronic pain syndrome,anxiety and depression   Clinical Impression   Chart reviewed, nurse cleared pt for participation in OT evaluation. Pt is in chair upon entering room, riented to self only. Note R pupil larger than L, nurse in room to assess.  Pt is a poor historian, responds "yes" when asked if he was indep with ADL/IADL at home, will need to confirm. Per chart review, decrease in cognition from this morning noted. Pt requires multi modal cues for simple one step directives with inconsistent participation. Pt presents with deficits in strength, activity tolerance, balance, cognition all affecting safe and optimal ADL completion. Pt is left in bed, all needs met, safety maintained. OT will continue to follow.       If plan is discharge home, recommend the following: A lot of help with bathing/dressing/bathroom;A lot of help with walking and/or transfers;Direct supervision/assist for medications management;Direct supervision/assist for financial management;Help with stairs or ramp for entrance;Assist for transportation;Supervision due to cognitive status    Functional Status Assessment  Patient has had a recent decline in their functional status and demonstrates the ability to make significant improvements in function in a reasonable and predictable amount of time.  Equipment Recommendations  Other (comment) (per next venue of care)    Recommendations for Other Services       Precautions / Restrictions Precautions Precautions: Fall Restrictions Weight Bearing  Restrictions: No      Mobility Bed Mobility Overal bed mobility: Needs Assistance Bed Mobility: Sit to Supine     Supine to sit: Mod assist, +2 for physical assistance          Transfers Overall transfer level: Needs assistance Equipment used: Rolling walker (2 wheels) Transfers: Sit to/from Stand Sit to Stand: Min assist, Mod assist           General transfer comment: step by step multi modal cues      Balance Overall balance assessment: Needs assistance Sitting-balance support: Feet supported Sitting balance-Leahy Scale: Fair     Standing balance support: Reliant on assistive device for balance, During functional activity, Bilateral upper extremity supported Standing balance-Leahy Scale: Poor                             ADL either performed or assessed with clinical judgement   ADL Overall ADL's : Needs assistance/impaired   Eating/Feeding Details (indicate cue type and reason): pt with lunch tray in front of him while seated in chair, no attempts to eat; nurse notified Grooming: Wash/dry face;Minimal assistance Grooming Details (indicate cue type and reason): HOH assist         Upper Body Dressing : Maximal assistance Upper Body Dressing Details (indicate cue type and reason): anticipate Lower Body Dressing: Maximal assistance Lower Body Dressing Details (indicate cue type and reason): anticipate Toilet Transfer: Moderate assistance;+2 for safety/equipment;Rolling walker (2 wheels) Toilet Transfer Details (indicate cue type and reason): simulated, step pivot from bedside chair to bed           General ADL Comments: current level of cognition affecting ADL participation on this date  Vision   Additional Comments: prior to mobility (back to bed) noted R pupil larger than L, nurse called into room to assess; pt tracking staff in room, unable to follow commands for formal vision screen. ?R sided gaze preference     Perception  Perception: Not tested (will continue to assess)       Praxis Praxis: Not tested (will continue to assess)       Pertinent Vitals/Pain Pain Assessment Pain Assessment: CPOT Facial Expression: Tense Body Movements: Protection Muscle Tension: Tense, rigid Compliance with ventilator (intubated pts.): N/A Vocalization (extubated pts.): Sighing, moaning CPOT Total: 4 Pain Intervention(s): Monitored during session, Repositioned     Extremity/Trunk Assessment Upper Extremity Assessment Upper Extremity Assessment: Generalized weakness;Difficult to assess due to impaired cognition (maintain shoulder flexion at 90 in BUE (one at a time) after therapist completes shoulder flexion movement however unable to initiate any type of AROM via one step direction following without hand over hand multi modal cueing)   Lower Extremity Assessment Lower Extremity Assessment: Generalized weakness;Defer to PT evaluation (B alternating SLR in bed after therapist initatied movement)   Cervical / Trunk Assessment Cervical / Trunk Assessment: Normal   Communication Communication Communication: Difficulty communicating thoughts/reduced clarity of speech Cueing Techniques: Verbal cues;Gestural cues;Tactile cues;Visual cues (multi modal cueing required throughout)   Cognition Arousal: Lethargic Behavior During Therapy: Flat affect Overall Cognitive Status: No family/caregiver present to determine baseline cognitive functioning Area of Impairment: Orientation, Attention, Memory, Following commands, Safety/judgement, Awareness, Problem solving                 Orientation Level: Disoriented to, Place, Time, Situation Current Attention Level: Focused Memory: Decreased recall of precautions, Decreased short-term memory Following Commands: Follows one step commands inconsistently Safety/Judgement: Decreased awareness of safety, Decreased awareness of deficits Awareness: Intellectual Problem Solving: Slow  processing, Decreased initiation, Requires verbal cues, Requires tactile cues, Difficulty sequencing General Comments: inconsistent one step direction following despite multi modal cueing- altered compared to this morning, nurse in room to assess and throughout mobility     General Comments  vss monitored and stable throughout            Home Living Family/patient expects to be discharged to:: Private residence Living Arrangements: Alone (per chart, pt states "I live with my parents")   Type of Home: House       Home Layout: One level               Home Equipment: None   Additional Comments: pt is a poor historian, will need to obtain PLOF      Prior Functioning/Environment Prior Level of Function : Independent/Modified Independent;Driving                        OT Problem List: Decreased strength;Decreased activity tolerance;Decreased knowledge of use of DME or AE;Impaired UE functional use;Impaired balance (sitting and/or standing);Decreased coordination;Decreased safety awareness;Decreased knowledge of precautions      OT Treatment/Interventions: Self-care/ADL training;Energy conservation;Balance training;Therapeutic exercise;DME and/or AE instruction;Manual therapy;Therapeutic activities;Patient/family education    OT Goals(Current goals can be found in the care plan section) Acute Rehab OT Goals OT Goal Formulation: Patient unable to participate in goal setting Time For Goal Achievement: 06/16/23 ADL Goals Pt Will Perform Lower Body Dressing: with supervision;sitting/lateral leans Pt Will Transfer to Toilet: stand pivot transfer;with min assist  OT Frequency: Min 1X/week       AM-PAC OT "6 Clicks" Daily Activity     Outcome Measure Help  from another person eating meals?: A Lot Help from another person taking care of personal grooming?: A Lot Help from another person toileting, which includes using toliet, bedpan, or urinal?: Total Help from another  person bathing (including washing, rinsing, drying)?: Total Help from another person to put on and taking off regular upper body clothing?: A Lot Help from another person to put on and taking off regular lower body clothing?: A Lot 6 Click Score: 10   End of Session Equipment Utilized During Treatment: Rolling walker (2 wheels);Gait belt;Oxygen Nurse Communication: Mobility status  Activity Tolerance: Treatment limited secondary to medical complications (Comment);Other (comment) (requires transfer to [facility] for further work up) Patient left: in bed;with call bell/phone within reach;with bed alarm set  OT Visit Diagnosis: Other abnormalities of gait and mobility (R26.89);Unsteadiness on feet (R26.81);Muscle weakness (generalized) (M62.81);Cognitive communication deficit (R41.841)                Time: 6295-2841 OT Time Calculation (min): 23 min Charges:  OT General Charges $OT Visit: 1 Visit OT Evaluation $OT Eval High Complexity: 1 High  Oleta Mouse, OTD OTR/L  06/02/23, 3:18 PM

## 2023-06-02 NOTE — Progress Notes (Signed)
The patient was seen and examined for tongue twitches.  The patient had no facial droop or gaze preference.  On neurological exam: He had normal muscle strength 4-5/5 in both lower extremities as well as both upper extremities with normal sensory exam to light touch.  He was alert and oriented and cooperative with questions.  He has been using Xanax on a as needed basis at home.  He was ordered 1 mg of p.o. Xanax.  He has obviously not been sleeping good despite given Valium.  He had a negative brain MRI for acute CVA a few hours ago.  Most recent blood glucose was 97.  We will continue to monitor him.

## 2023-06-02 NOTE — Progress Notes (Signed)
Noted PT now again with unequal pupils. Left 4 mm brisk and right 5 mm brisk. PT has no new complaints. PT still minimally conversational answering questions with yes no answers and very delayed. Update Dr. Joylene Igo who added Neurology to chat.  No new orders.

## 2023-06-02 NOTE — Discharge Summary (Signed)
Physician Discharge Summary   Patient: Clarence Hamilton MRN: [id number] DOB: May 20, 1956  Admit date:     05/29/2023  Discharge date: 06/02/23  Discharge Physician:     PCP: Malva Limes, MD   Recommendations at discharge:   Transfer to [facility] for long-term EEG monitoring.  Discharge Diagnoses: Principal Problem:   Toxic metabolic encephalopathy Active Problems:   Hypertension   ILD (interstitial lung disease) (HCC)   Chronic respiratory failure with hypoxia (HCC)   Chronic kidney disease, stage 3 unspecified (HCC)   Altered mental status  Resolved Problems:   * No resolved hospital problems. *  Hospital Course:  67 y.o male with significant PMH of Mild persistent Asthma/COPD On oxygen at 3 liters. recurrent flares, intolerant to steroids, HTN, HLD, Prediabetes,  Lumbar radiculopathy, chronic pain syndrome,anxiety and depression who presented to the ED with chief complaints of altered mental status.   Patient presented with c/o increasing anxiety and "dark thoughts". He report that he is ran out of his pain medication and other prescriptions for his depression. He is not a very good historian and is unclear what medications he is currently taking. He is prescribed Effexor which appeared to have been refilled on 04/21/23. He also takes Alprazolam and Buspar. Patient report that he takes medication as prescribed but unclear which medication he took today. He is also reporting diarrhea and burning with urination.   ED Course: Initial vital signs showed HR of beats/minute, BP mm Hg, the RR 30 breaths/minute, and the oxygen saturation % on and a temperature of 98.44F (36.9C). Patient was lethargic, moaned intermittently, and responded with one-word answers; symmetric movement in the arms and legs was observed. Pertinent Labs/Diagnostics Findings: Na+/ K+125/2.0  Glucose: 147 BUN/Cr.:30/1.91 AST/ALT:48/73 WBC:19.6 Hgb/Hct: 11.1/30.2 Plts: 725 Lactic acid:  2.5 PCT <0.10 COVID PCR: Negative,  UDS+ Opiate, Benzo and cannabinoid UA+ UTI VBG: pO2 pend; pCO2 34; pH 7.54;  HCO3 29.1, %O2 Sat pend.  CXR> CTH> Negative CTA Chest> No acute finding CT Abd/pelvis>No acute finding   Patient given 30 cc/kg of fluids and started on broad-spectrum antibiotics Vancomycin, cefepime and Flagyl for suspected sepsis secondary to suspected UTI. Patient remained altered and restless requiring benzos, Haldol and sitter at the bedside for safety. Due to persistent altered mental status, PCCM consulted.   Assessment and Plan:  Sepsis secondary to UTI Urine culture yields about 10,000 colonies of E. Coli Unclear significance Patient has completed a 3-day course of IV Rocephin       Acute toxic metabolic encephalopathy Seizures Likely Multifactorial in the setting of sepsis, drug intoxication and ?refeeding syndrome UDS+ Opiates, Benzo and Cannabinoid, he has hx of chronic pain syndrome, anxiety and depression on Alprazolam, Buspar, Effexor and hydrocodone. Patient admitted that he ran out of his meds and took "some meds" from his friend, it is unknown what or how much he took. Had seizure-like activity on 08/06 as well as on 08/08 .  Received 4 mg of Ativan and IV Keppra on 08/06 and received a dose of alprazolam on 08/08.   Seen by neurology and has a negative EEG (showed excessive beta activity seen in the background most likely due to the effect of benzodiazepine.  Pending EEG pattern) Neurology initially recommended holding off on further AED for now and recommended an MRI of the brain for further evaluation which showed  no acute intracranial abnormality. Findings of chronic small vessel ischemia and volume loss. Due to recurrent seizure-like activity, neurology has recommended transfer to  [facility] for long-term EEG monitoring.    AKI Hypokalemia/hypomagnesemia/hyponatremia Most likely secondary to diuretic therapy as patient was on  hydrochlorothiazide prior to admission Serum creatinine was 1.5 on admission and is down to 1.1 Electrolytes have been supplemented       Anxiety and depression Psych consult.  Patient with hallucinations, dark thoughts but denies having any suicidal or homicidal ideation.        COPD with acute exacerbation Continue scheduled and as needed bronchodilator therapy Continue Singulair Oxygen as needed         Lumbar radiculopathy Chronic pain syndrome  Hold off on pain meds for now until mental status improves.            Consultants: Critical care, neurology Procedures performed: EEG  Disposition:  Transfer to acute care hospital Diet recommendation:  Discharge Diet Orders (From admission, onward)     Start     Ordered   06/02/23 0000  Diet - low sodium heart healthy        06/02/23 1320           Cardiac diet DISCHARGE MEDICATION: Allergies as of 06/02/2023       Reactions   Sulfa Antibiotics         Medication List     STOP taking these medications    Azelastine-Fluticasone 137-50 MCG/ACT Susp   HYDROcodone-acetaminophen 10-325 MG tablet Commonly known as: NORCO   ketoconazole 2 % cream Commonly known as: NIZORAL   levalbuterol 1.25 MG/3ML nebulizer solution Commonly known as: XOPENEX   meloxicam 15 MG tablet Commonly known as: MOBIC   mupirocin ointment 2 % Commonly known as: BACTROBAN   tamsulosin 0.4 MG Caps capsule Commonly known as: FLOMAX   triamcinolone cream 0.1 % Commonly known as: KENALOG   valACYclovir 1000 MG tablet Commonly known as: VALTREX       TAKE these medications    alprazolam 2 MG tablet Commonly known as: XANAX TAKE 1 TABLET BY MOUTH FOUR TIMES DAILY   busPIRone 15 MG tablet Commonly known as: BUSPAR TAKE 1 TABLET(15 MG) BY MOUTH TWICE DAILY   CENTRUM SILVER PO Take 1 tablet by mouth daily.   fexofenadine 180 MG tablet Commonly known as: ALLEGRA Take by mouth.   furosemide 20 MG  tablet Commonly known as: LASIX TAKE 1 TABLET(20 MG) BY MOUTH DAILY AS NEEDED FOR FLUID RETENTION   hydrochlorothiazide 25 MG tablet Commonly known as: HYDRODIURIL TAKE 1 TABLET(25 MG) BY MOUTH DAILY   losartan 100 MG tablet Commonly known as: COZAAR Take 1 tablet by mouth daily.   montelukast 10 MG tablet Commonly known as: SINGULAIR Take 1 tablet by mouth daily.   naloxone 4 MG/0.1ML Liqd nasal spray kit Commonly known as: NARCAN Narcan 4 mg/actuation nasal spray  Take by nasal route every 3 minutes until patient awakes or EMS arrives.   OXYGEN Place 3 L into the nose. At all times   ProAir HFA 108 (90 Base) MCG/ACT inhaler Generic drug: albuterol Inhale 1 puff into the lungs every 6 (six) hours as needed. What changed: Another medication with the same name was removed. Continue taking this medication, and follow the directions you see here.   rosuvastatin 20 MG tablet Commonly known as: CRESTOR Take 1 tablet (20 mg total) by mouth daily.   Tiadylt ER 120 MG 24 hr capsule Generic drug: diltiazem TAKE 1 CAPSULE(120 MG) BY MOUTH DAILY   venlafaxine XR 75 MG 24 hr capsule Commonly known as: EFFEXOR-XR TAKE 3  CAPSULES(225 MG) BY MOUTH DAILY WITH BREAKFAST        Discharge Exam: Filed Weights   05/31/23 0500 06/01/23 0500 06/02/23 0500  Weight: 92.1 kg 92.1 kg 96.2 kg   GENERAL: 67 year-old critically ill patient lying in the bed in no acute distress EYES: PEERLA. No scleral icterus. Extraocular muscles intact.  HEENT: Head atraumatic, normocephalic. Oropharynx and nasopharynx clear.  NECK:  No JVD, supple  LUNGS: Normal breath sounds bilaterally.  No use of accessory muscles of respiration.  CARDIOVASCULAR: S1, S2 normal. No murmurs, rubs, or gallops.  ABDOMEN: Soft, NTND EXTREMITIES: No swelling or erythema.  Capillary refill > 3 seconds in all extremities. Pulses palpable distally. NEUROLOGIC: The patient is  alert and oriented x 3 with intermittent  consfusion. No new or focal neurological deficit appreciated. Cranial nerves are intact.  SKIN: No obvious rash, lesion, or ulcer. Warm to touch    Condition at discharge: serious  The results of significant diagnostics from this hospitalization (including imaging, microbiology, ancillary and laboratory) are listed below for reference.   Imaging Studies: MR BRAIN WO CONTRAST  Result Date: 06/01/2023 CLINICAL DATA:  Altered mental status EXAM: MRI HEAD WITHOUT CONTRAST TECHNIQUE: Multiplanar, multiecho pulse sequences of the brain and surrounding structures were obtained without intravenous contrast. COMPARISON:  None Available. FINDINGS: Brain: No acute infarct, mass effect or extra-axial collection. No acute or chronic hemorrhage. There is multifocal hyperintense T2-weighted signal within the white matter. Generalized volume loss. The midline structures are normal. Vascular: Major flow voids are preserved. Skull and upper cervical spine: Normal calvarium and skull base. Visualized upper cervical spine and soft tissues are normal. Sinuses/Orbits:No paranasal sinus fluid levels or advanced mucosal thickening. No mastoid or middle ear effusion. Normal orbits. IMPRESSION: 1. No acute intracranial abnormality. 2. Findings of chronic small vessel ischemia and volume loss. Electronically Signed   By: Deatra Robinson M.D.   On: 06/01/2023 20:03   EEG adult  Result Date: 06/01/2023 Charlsie Quest, MD     06/01/2023  2:02 PM Patient Name: Thinh Beutler Climer MRN: [id number] Epilepsy Attending: Charlsie Quest Referring Physician/Provider: Milon Dikes, MD Date: 06/01/2023 Duration: 27.52 mins Patient history: 67yo M with ams getting eeg to evaluate for seizure. Level of alertness: Awake AEDs during EEG study: Valium Technical aspects: This EEG study was done with scalp electrodes positioned according to the 10-20 International system of electrode placement. Electrical activity was reviewed with band pass filter of  1-70Hz , sensitivity of 7 uV/mm, display speed of 20mm/sec with a 60Hz  notched filter applied as appropriate. EEG data were recorded continuously and digitally stored.  Video monitoring was available and reviewed as appropriate. Description: No clear posterior dominant rhythm was seen. There is an excessive amount of 15 to 18 Hz beta activity distributed symmetrically and diffusely. Hyperventilation and photic stimulation were not performed.   ABNORMALITY - Excessive beta, generalized IMPRESSION: This study is within normal limits. The excessive beta activity seen in the background is most likely due to the effect of benzodiazepine and is a benign EEG pattern. No seizures or epileptiform discharges were seen throughout the recording. A normal interictal EEG does not exclude the diagnosis of epilepsy. Priyanka [name]   CT HEAD WO CONTRAST ( )  Result Date: 05/31/2023 CLINICAL DATA:  Mental status change, unknown cause EXAM: CT HEAD WITHOUT CONTRAST TECHNIQUE: Contiguous axial images were obtained from the base of the skull through the vertex without intravenous contrast. RADIATION DOSE REDUCTION: This exam was performed according to the  departmental dose-optimization program which includes automated exposure control, adjustment of the mA and/or kV according to patient size and/or use of iterative reconstruction technique. COMPARISON:  Two days ago 05/29/2023 FINDINGS: Brain: No intracranial hemorrhage, mass effect, or midline shift. No hydrocephalus. The basilar cisterns are patent. Mild periventricular chronic small vessel ischemia, not unexpected for age. No evidence of territorial infarct or acute ischemia. No extra-axial or intracranial fluid collection. Vascular: Atherosclerosis of skullbase vasculature without hyperdense vessel or abnormal calcification. Skull: No fracture or focal lesion. Sinuses/Orbits: No acute finding. Other: None. IMPRESSION: 1. No acute intracranial abnormality. 2. Mild chronic small  vessel ischemia. Electronically Signed   By: Narda Rutherford M.D.   On: 05/31/2023 20:31   CT CHEST ABDOMEN PELVIS W CONTRAST  Result Date: 05/29/2023 CLINICAL DATA:  Sepsis EXAM: CT CHEST, ABDOMEN, AND PELVIS WITH CONTRAST TECHNIQUE: Multidetector CT imaging of the chest, abdomen and pelvis was performed following the standard protocol during bolus administration of intravenous contrast. RADIATION DOSE REDUCTION: This exam was performed according to the departmental dose-optimization program which includes automated exposure control, adjustment of the mA and/or kV according to patient size and/or use of iterative reconstruction technique. CONTRAST:  80mL OMNIPAQUE IOHEXOL 300 MG/ML  SOLN COMPARISON:  None Available. FINDINGS: Examination is generally limited by patient and breath motion artifact throughout. CT CHEST FINDINGS Cardiovascular: Aortic atherosclerosis. Normal heart size. Three-vessel coronary artery calcifications. No pericardial effusion. Mediastinum/Nodes: No enlarged mediastinal, hilar, or axillary lymph nodes. Small hiatal hernia. Thyroid gland and esophagus demonstrate no significant findings. Lungs/Pleura: Tracheobronchomalacia. Diffuse bilateral bronchial wall thickening. Mosaic attenuation of the airspaces. No pleural effusion or pneumothorax. Musculoskeletal: No chest wall abnormality. No acute osseous findings. CT ABDOMEN PELVIS FINDINGS Hepatobiliary: No solid liver abnormality is seen. No gallstones, gallbladder wall thickening, or biliary dilatation. Pancreas: Unremarkable. No pancreatic ductal dilatation or surrounding inflammatory changes. Spleen: Normal in size without significant abnormality. Adrenals/Urinary Tract: Adrenal glands are unremarkable. Kidneys are normal, without renal calculi, solid lesion, or hydronephrosis. Bladder is unremarkable. Stomach/Bowel: Stomach is within normal limits. Appendix appears normal. No evidence of bowel wall thickening, distention, or  inflammatory changes. Vascular/Lymphatic: Aortic atherosclerosis. No enlarged abdominal or pelvic lymph nodes. Reproductive: Prostatomegaly. Other: Small fat containing umbilical hernia. Small fat containing bilateral inguinal hernias. No ascites. Musculoskeletal: No acute osseous findings. IMPRESSION: 1. Examination is generally limited by patient and breath motion artifact throughout. 2. Within this limitation, no acute CT findings of the chest, abdomen, or pelvis to explain sepsis. 3. Tracheobronchomalacia. Diffuse bilateral bronchial wall thickening. Mosaic attenuation of the airspaces, suggestive of small airways disease. 4. Coronary artery disease. 5. Prostatomegaly. Aortic Atherosclerosis (ICD10-I70.0). Electronically Signed   By: Jearld Lesch M.D.   On: 05/29/2023 20:32   DG Chest Port 1 View  Result Date: 05/29/2023 CLINICAL DATA:  Questionable sepsis - evaluate for abnormality EXAM: PORTABLE CHEST 1 VIEW COMPARISON:  None Available. FINDINGS: Low lung volumes. Heart and mediastinal contours are within normal limits. No focal opacities or effusions. No acute bony abnormality. IMPRESSION: Low volumes.  No active cardiopulmonary disease. Electronically Signed   By: Charlett Nose M.D.   On: 05/29/2023 18:37   CT Head Wo Contrast  Result Date: 05/29/2023 CLINICAL DATA:  Mental status change, unknown cause EXAM: CT HEAD WITHOUT CONTRAST TECHNIQUE: Contiguous axial images were obtained from the base of the skull through the vertex without intravenous contrast. RADIATION DOSE REDUCTION: This exam was performed according to the departmental dose-optimization program which includes automated exposure control, adjustment of the mA and/or  kV according to patient size and/or use of iterative reconstruction technique. COMPARISON:  None Available. FINDINGS: Brain: No acute intracranial abnormality. Specifically, no hemorrhage, hydrocephalus, mass lesion, acute infarction, or significant intracranial injury.  Vascular: No hyperdense vessel or unexpected calcification. Skull: No acute calvarial abnormality. Sinuses/Orbits: No acute findings Other: None IMPRESSION: No acute intracranial abnormality. Electronically Signed   By: Charlett Nose M.D.   On: 05/29/2023 18:36    Microbiology: Results for orders placed or performed during the hospital encounter of 05/29/23  Blood Culture (routine x 2)     Status: None (Preliminary result)   Collection Time: 05/29/23  6:16 PM   Specimen: BLOOD  Result Value Ref Range Status   Specimen Description BLOOD BLOOD RIGHT ARM  Final   Special Requests   Final    BOTTLES DRAWN AEROBIC AND ANAEROBIC Blood Culture results may not be optimal due to an excessive volume of blood received in culture bottles   Culture   Final    NO GROWTH 4 DAYS Performed at [facility], [address], [place], [place] [address]    Report Status PENDING  Incomplete  Resp panel by RT-PCR (RSV, Flu A&B, Covid) Anterior Nasal Swab     Status: None   Collection Time: 05/29/23  6:36 PM   Specimen: Anterior Nasal Swab  Result Value Ref Range Status   SARS Coronavirus 2 by RT PCR NEGATIVE NEGATIVE Final    Comment: (NOTE) SARS-CoV-2 target nucleic acids are NOT DETECTED.  The SARS-CoV-2 RNA is generally detectable in upper respiratory specimens during the acute phase of infection. The lowest concentration of SARS-CoV-2 viral copies this assay can detect is 138 copies/mL. A negative result does not preclude SARS-Cov-2 infection and should not be used as the sole basis for treatment or other patient management decisions. A negative result may occur with  improper specimen collection/handling, submission of specimen other than nasopharyngeal swab, presence of viral mutation(s) within the areas targeted by this assay, and inadequate number of viral copies(<138 copies/mL). A negative result must be combined with clinical observations, patient history, and  epidemiological information. The expected result is Negative.  Fact Sheet for Patients:  BloggerCourse.com  Fact Sheet for Healthcare Providers:  SeriousBroker.it  This test is no t yet approved or cleared by the [place] FDA and  has been authorized for detection and/or diagnosis of SARS-CoV-2 by FDA under an Emergency Use Authorization (EUA). This EUA will remain  in effect (meaning this test can be used) for the duration of the COVID-19 declaration under Section 564(b)(1) of the Act, 21 U.S.C.section 360bbb-3(b)(1), unless the authorization is terminated  or revoked sooner.       Influenza A by PCR NEGATIVE NEGATIVE Final   Influenza B by PCR NEGATIVE NEGATIVE Final    Comment: (NOTE) The Xpert Xpress SARS-CoV-2/FLU/RSV plus assay is intended as an aid in the diagnosis of influenza from Nasopharyngeal swab specimens and should not be used as a sole basis for treatment. Nasal washings and aspirates are unacceptable for Xpert Xpress SARS-CoV-2/FLU/RSV testing.  Fact Sheet for Patients: BloggerCourse.com  Fact Sheet for Healthcare Providers: SeriousBroker.it  This test is not yet approved or cleared by the [place] FDA and has been authorized for detection and/or diagnosis of SARS-CoV-2 by FDA under an Emergency Use Authorization (EUA). This EUA will remain in effect (meaning this test can be used) for the duration of the COVID-19 declaration under Section 564(b)(1) of the Act, 21 U.S.C. section 360bbb-3(b)(1), unless the authorization is  terminated or revoked.     Resp Syncytial Virus by PCR NEGATIVE NEGATIVE Final    Comment: (NOTE) Fact Sheet for Patients: BloggerCourse.com  Fact Sheet for Healthcare Providers: SeriousBroker.it  This test is not yet approved or cleared by the [place] FDA and has been  authorized for detection and/or diagnosis of SARS-CoV-2 by FDA under an Emergency Use Authorization (EUA). This EUA will remain in effect (meaning this test can be used) for the duration of the COVID-19 declaration under Section 564(b)(1) of the Act, 21 U.S.C. section 360bbb-3(b)(1), unless the authorization is terminated or revoked.  Performed at [facility], [address], [place], [place] [address]   Blood Culture (routine x 2)     Status: Abnormal   Collection Time: 05/29/23  6:36 PM   Specimen: BLOOD  Result Value Ref Range Status   Specimen Description   Final    BLOOD BLOOD LEFT ARM Performed at [facility], [address], [place], [place] [address]    Special Requests   Final    BOTTLES DRAWN AEROBIC AND ANAEROBIC Blood Culture adequate volume Performed at [facility], [address] [place], [place] [address]    Culture  Setup Time   Final    ANAEROBIC BOTTLE ONLY GRAM POSITIVE COCCI CRITICAL RESULT CALLED TO, READ BACK BY AND VERIFIED WITH: MORGAN GOBBLE 1902 05/30/23 MU    Culture (A)  Final    STAPHYLOCOCCUS EPIDERMIDIS THE SIGNIFICANCE OF ISOLATING THIS ORGANISM FROM A SINGLE SET OF BLOOD CULTURES WHEN MULTIPLE SETS ARE DRAWN IS UNCERTAIN. PLEASE NOTIFY THE MICROBIOLOGY DEPARTMENT WITHIN ONE WEEK IF SPECIATION AND SENSITIVITIES ARE REQUIRED. Performed at [facility], [address], [place], [place] [address]    Report Status 06/01/2023 FINAL  Final  Blood Culture ID Panel (Reflexed)     Status: Abnormal   Collection Time: 05/29/23  6:36 PM  Result Value Ref Range Status   Enterococcus faecalis NOT DETECTED NOT DETECTED Final   Enterococcus Faecium NOT DETECTED NOT DETECTED Final   Listeria monocytogenes NOT DETECTED NOT DETECTED Final   Staphylococcus species DETECTED (A) NOT DETECTED Final    Comment: CRITICAL RESULT CALLED TO, READ BACK BY AND VERIFIED WITH: MORGAN GOBBLE 1902 05/30/23 MU    Staphylococcus aureus (BCID)  NOT DETECTED NOT DETECTED Final   Staphylococcus epidermidis DETECTED (A) NOT DETECTED Final    Comment: CRITICAL RESULT CALLED TO, READ BACK BY AND VERIFIED WITH: MORGAN GOBBLE 1902 05/30/23 MU    Staphylococcus lugdunensis NOT DETECTED NOT DETECTED Final   Streptococcus species NOT DETECTED NOT DETECTED Final   Streptococcus agalactiae NOT DETECTED NOT DETECTED Final   Streptococcus pneumoniae NOT DETECTED NOT DETECTED Final   Streptococcus pyogenes NOT DETECTED NOT DETECTED Final   A.calcoaceticus-baumannii NOT DETECTED NOT DETECTED Final   Bacteroides fragilis NOT DETECTED NOT DETECTED Final   Enterobacterales NOT DETECTED NOT DETECTED Final   Enterobacter cloacae complex NOT DETECTED NOT DETECTED Final   Escherichia coli NOT DETECTED NOT DETECTED Final   Klebsiella aerogenes NOT DETECTED NOT DETECTED Final   Klebsiella oxytoca NOT DETECTED NOT DETECTED Final   Klebsiella pneumoniae NOT DETECTED NOT DETECTED Final   Proteus species NOT DETECTED NOT DETECTED Final   Salmonella species NOT DETECTED NOT DETECTED Final   Serratia marcescens NOT DETECTED NOT DETECTED Final   Haemophilus influenzae NOT DETECTED NOT DETECTED Final   Neisseria meningitidis NOT DETECTED NOT DETECTED Final   Pseudomonas aeruginosa NOT DETECTED NOT DETECTED Final   Stenotrophomonas  maltophilia NOT DETECTED NOT DETECTED Final   Candida albicans NOT DETECTED NOT DETECTED Final   Candida auris NOT DETECTED NOT DETECTED Final   Candida glabrata NOT DETECTED NOT DETECTED Final   Candida krusei NOT DETECTED NOT DETECTED Final   Candida parapsilosis NOT DETECTED NOT DETECTED Final   Candida tropicalis NOT DETECTED NOT DETECTED Final   Cryptococcus neoformans/gattii NOT DETECTED NOT DETECTED Final   Methicillin resistance mecA/C NOT DETECTED NOT DETECTED Final    Comment: Performed at [facility], [address] [place], [place] [address]  Urine Culture     Status: Abnormal   Collection Time: 05/29/23   7:41 PM   Specimen: Urine, Random  Result Value Ref Range Status   Specimen Description   Final    URINE, RANDOM Performed at [facility], [address] [place], [place] [address]    Special Requests   Final    NONE Reflexed from [id number] Performed at [facility], [address] [place], [place] [address]    Culture 10,000 COLONIES/mL ESCHERICHIA COLI (A)  Final   Report Status 05/31/2023 FINAL  Final   Organism ID, Bacteria ESCHERICHIA COLI (A)  Final      Susceptibility   Escherichia coli - MIC*    AMPICILLIN 4 SENSITIVE Sensitive     CEFAZOLIN <=4 SENSITIVE Sensitive     CEFEPIME <=0.12 SENSITIVE Sensitive     CEFTRIAXONE <=0.25 SENSITIVE Sensitive     CIPROFLOXACIN <=0.25 SENSITIVE Sensitive     GENTAMICIN <=1 SENSITIVE Sensitive     IMIPENEM <=0.25 SENSITIVE Sensitive     NITROFURANTOIN <=16 SENSITIVE Sensitive     TRIMETH/SULFA <=20 SENSITIVE Sensitive     AMPICILLIN/SULBACTAM <=2 SENSITIVE Sensitive     PIP/TAZO <=4 SENSITIVE Sensitive     * 10,000 COLONIES/mL ESCHERICHIA COLI    Labs: CBC: Recent Labs  Lab 05/29/23 1618 05/30/23 0319 05/31/23 0349 06/01/23 0412  WBC 19.6* 21.5* 12.5* 13.2*  HGB 11.1* 9.9* 8.4* 9.4*  HCT 30.2* 26.7* 22.8* 26.7*  MCV 82.5 81.4 82.9 86.1  PLT 725* 714* 570* 666*   Basic Metabolic Panel: Recent Labs  Lab 05/30/23 0319 05/30/23 1017 05/30/23 1714 05/30/23 2147 05/31/23 0349 05/31/23 1259 05/31/23 1632 06/01/23 0412 06/02/23 0405  NA 128* 130*   < > 129* 131*  --  136 139 135  K <2.0* <2.0*   < > 2.3* 2.8* 3.4* 3.7 3.1* 3.9  CL 89* 93*   < > 93* 97*  --  101 102 100  CO2 23 23   < > 26 26  --  24 27 25   GLUCOSE 113* 128*   < > 118* 116*  --  105* 116* 105*  BUN 28* 23   < > 21 19  --  18 21 17   CREATININE 1.80* 1.71*   < > 1.46* 1.28*  --  1.32* 1.24 1.13  CALCIUM 8.1* 7.8*   < > 7.6* 7.7*  --  8.9 8.7* 8.5*  MG 1.6* 2.8*  --   --  2.3  --  2.2 2.2 1.8  PHOS <1.0*  --   --   --  3.3  --  2.6  3.0 3.3   < > = values in this interval not displayed.   Liver Function Tests: Recent Labs  Lab 05/29/23 1618 05/30/23 0319 05/31/23 0349 06/01/23 0412  AST 48* 41 33 45*  ALT 73* 61* 46* 58*  ALKPHOS 101 86 72 70  BILITOT 0.7 1.1 0.6  0.9  PROT 7.3 6.8 5.6* 6.4*  ALBUMIN 3.5 3.3* 2.8* 3.2*   CBG: Recent Labs  Lab 06/01/23 2327 06/02/23 0324 06/02/23 0752 06/02/23 1236 06/02/23 1257  GLUCAP 97 100* 115* 103* 103*    Discharge time spent: greater than 30 minutes.  Signed: Lucile Shutters, MD [facility] 06/02/2023

## 2023-06-02 NOTE — H&P (Addendum)
History and Physical    Patient: Clarence Hamilton NUU:[id number] DOB: [birth year] DOA: 06/02/2023 DOS: the patient was seen and examined on 06/02/2023 PCP: Malva Limes, MD  Patient coming from:  Patient has been transferred from [facility] for needing continuous EEG.  Chief Complaint: No chief complaint on file.  HPI: Clarence Hamilton is a 67 y.o. 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 recurrent flares, intolerant to steroids, HTN, HLD, Prediabetes, Lumbar radiculopathy, chronic pain syndrome,anxiety and depression who presented to the ED on May 29, 2023 at [facility] with chief complaints of altered mental status.   On exam on May 29, 2023: Patient was lethargic, moaned intermittently, and responded with one-word answers; symmetric movement in the arms and legs was observed. Na+/ K+125/2.0 WBC:19.6 Plts: 725UDS+ Opiate, Benzo and cannabinoid Patient given 30 cc/kg of fluids and started on broad-spectrum antibiotics Vancomycin, cefepime and Flagyl for suspected sepsis secondary to suspected UTI.   Patient had persistent altered mental status during his hospitalization with episodic seizure-like activity noted on initial admission, August 6 as well as August 8.  MRI brain was negative.  Therefore given persistent recurrent seizure-like activity, patient is transferred to [facility].  Patient is evaluated at room 328 at [facility] on June 02, 2023.  Unfortunately patient is a poor historian.  Gives slow, delayed responses and sometimes he does not answer questions.  Offers no complaints at this time Review of Systems: unable to review all systems due to the inability of the patient to answer questions. Past Medical History:  Diagnosis Date   Anxiety    Arthritis    Asthma    History of chicken pox    Hyperglycemia    Hyperlipidemia    Hypertension    Obesity    Past Surgical History:  Procedure Laterality Date    HERNIA REPAIR  1970's   Inguinal hernia repair x 2    TONSILLECTOMY AND ADENOIDECTOMY  1960's   Social History:  reports that he has never smoked. He has never used smokeless tobacco. He reports that he does not drink alcohol and does not use drugs.  Allergies  Allergen Reactions   Sulfa Antibiotics     Family History  Problem Relation Age of Onset   Hypertension Mother    Breast cancer Mother    Melanoma Father    Hypertension Father    Parkinson's disease Father    Throat cancer Paternal Grandmother     Prior to Admission medications   Medication Sig Start Date End Date Taking? Authorizing Provider  alprazolam Prudy Feeler) 2 MG tablet TAKE 1 TABLET BY MOUTH FOUR TIMES DAILY 04/08/23   Malva Limes, MD  busPIRone (BUSPAR) 15 MG tablet TAKE 1 TABLET(15 MG) BY MOUTH TWICE DAILY 03/07/23   Malva Limes, MD  diltiazem (TIADYLT ER) 120 MG 24 hr capsule TAKE 1 CAPSULE(120 MG) BY MOUTH DAILY 05/04/23   Malva Limes, MD  fexofenadine [facility]) 180 MG tablet Take by mouth. 07/15/21 07/15/22  [provider]  furosemide (LASIX) 20 MG tablet TAKE 1 TABLET(20 MG) BY MOUTH DAILY AS NEEDED FOR FLUID RETENTION 12/30/22   Simmons-Robinson, Tawanna Cooler, MD  hydrochlorothiazide (HYDRODIURIL) 25 MG tablet TAKE 1 TABLET(25 MG) BY MOUTH DAILY 05/06/23   Malva Limes, MD  losartan (COZAAR) 100 MG tablet Take 1 tablet by mouth daily. 01/05/22   [provider]  montelukast (SINGULAIR) 10 MG tablet Take 1 tablet by mouth daily.  02/18/10   [provider]  Multiple Vitamins-Minerals (CENTRUM SILVER PO) Take 1 tablet by mouth daily. 01/03/09   [provider]  naloxone [facility]) nasal spray 4 mg/0.1 mL Narcan 4 mg/actuation nasal spray  Take by nasal route every 3 minutes until patient awakes or EMS arrives.    [provider]  OXYGEN Place 3 L into the nose. At all times    [provider]  PROAIR HFA 108 [id number] Base) MCG/ACT inhaler Inhale 1 puff into the  lungs every 6 (six) hours as needed.  02/01/16   [provider]  rosuvastatin (CRESTOR) 20 MG tablet Take 1 tablet (20 mg total) by mouth daily. 05/03/22   Malva Limes, MD  venlafaxine XR (EFFEXOR-XR) 75 MG 24 hr capsule TAKE 3 CAPSULES(225 MG) BY MOUTH DAILY WITH BREAKFAST 04/21/23   Malva Limes, MD    Physical Exam: Vitals:   06/02/23 1938  BP: (!) 167/90  Pulse: 99  Temp: 100.1 F (37.8 C)  TempSrc: Axillary  SpO2: 100%   General: Patient does not appear to be distressed, is awake, makes intermittent eye contact, even gives name, intermittently follows directions.  However at other times he does not.  Keeps mouth open, generally keeps looking at the ceiling, there is conjunctival injection and some conjunctival swelling, however no purulent discharge is noted Respiratory; bilateral air entry vesicular, with patient following intermittent directions for deep breathing Cardiovascular exam S1-S2 normal Abdomen all quadrants soft nontender Extremities warm without edema, no focal motor deficit is noted patient is withdrawing to mild noxious stimulation on all 4 extremities Data Reviewed:  Labs on Admission:  Results for orders placed or performed during the hospital encounter of 05/29/23 (from the past 24 hour(s))  Glucose, capillary     Status: None   Collection Time: 06/01/23 11:27 PM  Result Value Ref Range   Glucose-Capillary 97 70 - 99 mg/dL  Glucose, capillary     Status: Abnormal   Collection Time: 06/02/23  3:24 AM  Result Value Ref Range   Glucose-Capillary 100 (H) 70 - 99 mg/dL  Magnesium     Status: None   Collection Time: 06/02/23  4:05 AM  Result Value Ref Range   Magnesium 1.8 1.7 - 2.4 mg/dL  Phosphorus     Status: None   Collection Time: 06/02/23  4:05 AM  Result Value Ref Range   Phosphorus 3.3 2.5 - 4.6 mg/dL  Basic metabolic panel     Status: Abnormal   Collection Time: 06/02/23  4:05 AM  Result Value Ref Range   Sodium 135 135 - 145 mmol/L    Potassium 3.9 3.5 - 5.1 mmol/L   Chloride 100 98 - 111 mmol/L   CO2 25 22 - 32 mmol/L   Glucose, Bld 105 (H) 70 - 99 mg/dL   BUN 17 8 - 23 mg/dL   Creatinine, Ser 1.09 0.61 - 1.24 mg/dL   Calcium 8.5 (L) 8.9 - 10.3 mg/dL   GFR, Estimated >60 >45 mL/min   Anion gap 10 5 - 15  VITAMIN D 25 Hydroxy (Vit-D Deficiency, Fractures)     Status: None   Collection Time: 06/02/23  4:05 AM  Result Value Ref Range   Vit D, 25-Hydroxy 48.89 30 - 100 ng/mL  Glucose, capillary     Status: Abnormal   Collection Time: 06/02/23  7:52 AM  Result Value Ref Range   Glucose-Capillary 115 (H) 70 - 99 mg/dL  Glucose, capillary     Status:  Abnormal   Collection Time: 06/02/23 12:36 PM  Result Value Ref Range   Glucose-Capillary 103 (H) 70 - 99 mg/dL  Glucose, capillary     Status: Abnormal   Collection Time: 06/02/23 12:57 PM  Result Value Ref Range   Glucose-Capillary 103 (H) 70 - 99 mg/dL  Glucose, capillary     Status: Abnormal   Collection Time: 06/02/23  4:33 PM  Result Value Ref Range   Glucose-Capillary 117 (H) 70 - 99 mg/dL   Basic Metabolic Panel: Recent Labs  Lab 05/30/23 0319 05/30/23 1017 05/30/23 1714 05/30/23 2147 05/31/23 0349 05/31/23 1259 05/31/23 1632 06/01/23 0412 06/02/23 0405  NA 128* 130*   < > 129* 131*  --  136 139 135  K <2.0* <2.0*   < > 2.3* 2.8* 3.4* 3.7 3.1* 3.9  CL 89* 93*   < > 93* 97*  --  101 102 100  CO2 23 23   < > 26 26  --  24 27 25   GLUCOSE 113* 128*   < > 118* 116*  --  105* 116* 105*  BUN 28* 23   < > 21 19  --  18 21 17   CREATININE 1.80* 1.71*   < > 1.46* 1.28*  --  1.32* 1.24 1.13  CALCIUM 8.1* 7.8*   < > 7.6* 7.7*  --  8.9 8.7* 8.5*  MG 1.6* 2.8*  --   --  2.3  --  2.2 2.2 1.8  PHOS <1.0*  --   --   --  3.3  --  2.6 3.0 3.3   < > = values in this interval not displayed.   Liver Function Tests: Recent Labs  Lab 05/29/23 1618 05/30/23 0319 05/31/23 0349 06/01/23 0412  AST 48* 41 33 45*  ALT 73* 61* 46* 58*  ALKPHOS 101 86 72 70   BILITOT 0.7 1.1 0.6 0.9  PROT 7.3 6.8 5.6* 6.4*  ALBUMIN 3.5 3.3* 2.8* 3.2*   No results for input(s): "LIPASE", "AMYLASE" in the last 168 hours. Recent Labs  Lab 05/29/23 1816 06/01/23 0944  AMMONIA 14 18   CBC: Recent Labs  Lab 05/29/23 1618 05/30/23 0319 05/31/23 0349 06/01/23 0412  WBC 19.6* 21.5* 12.5* 13.2*  HGB 11.1* 9.9* 8.4* 9.4*  HCT 30.2* 26.7* 22.8* 26.7*  MCV 82.5 81.4 82.9 86.1  PLT 725* 714* 570* 666*   Cardiac Enzymes: Recent Labs  Lab 05/29/23 1618  CKTOTAL 74    BNP (last 3 results) No results for input(s): "PROBNP" in the last 8760 hours. CBG: Recent Labs  Lab 06/02/23 0324 06/02/23 0752 06/02/23 1236 06/02/23 1257 06/02/23 1633  GLUCAP 100* 115* 103* 103* 117*    Radiological Exams on Admission:  CT HEAD WO CONTRAST ( )  Result Date: 06/02/2023 CLINICAL DATA:  Neuro deficit, stroke suspected. EXAM: CT HEAD WITHOUT CONTRAST TECHNIQUE: Contiguous axial images were obtained from the base of the skull through the vertex without intravenous contrast. RADIATION DOSE REDUCTION: This exam was performed according to the departmental dose-optimization program which includes automated exposure control, adjustment of the mA and/or kV according to patient size and/or use of iterative reconstruction technique. COMPARISON:  Brain MRI 1 day prior FINDINGS: Brain: There is no evidence of acute intracranial hemorrhage, extra-axial fluid collection, or acute infarct. Parenchymal volume is stable. The ventricles are stable in size. Gray-white differentiation is preserved. The pituitary and suprasellar region are normal. There is no mass lesion. There is no mass effect or midline shift. Vascular: There is  calcification of the bilateral carotid siphons. Skull: Normal. Negative for fracture or focal lesion. Sinuses/Orbits: There is mild mucosal thickening in the paranasal sinuses. The globes and orbits are unremarkable. Other: The mastoid air cells and middle ear  cavities are clear. IMPRESSION: Stable noncontrast head CT with no acute intracranial pathology. Electronically Signed   By: Lesia Hausen M.D.   On: 06/02/2023 15:26   MR BRAIN WO CONTRAST  Result Date: 06/01/2023 CLINICAL DATA:  Altered mental status EXAM: MRI HEAD WITHOUT CONTRAST TECHNIQUE: Multiplanar, multiecho pulse sequences of the brain and surrounding structures were obtained without intravenous contrast. COMPARISON:  None Available. FINDINGS: Brain: No acute infarct, mass effect or extra-axial collection. No acute or chronic hemorrhage. There is multifocal hyperintense T2-weighted signal within the white matter. Generalized volume loss. The midline structures are normal. Vascular: Major flow voids are preserved. Skull and upper cervical spine: Normal calvarium and skull base. Visualized upper cervical spine and soft tissues are normal. Sinuses/Orbits:No paranasal sinus fluid levels or advanced mucosal thickening. No mastoid or middle ear effusion. Normal orbits. IMPRESSION: 1. No acute intracranial abnormality. 2. Findings of chronic small vessel ischemia and volume loss. Electronically Signed   By: Deatra Robinson M.D.   On: 06/01/2023 20:03   EEG adult  Result Date: 06/01/2023 Charlsie Quest, MD     06/01/2023  2:02 PM Patient Name: Sumpter Kice Helling MRN: [id number] Epilepsy Attending: Charlsie Quest Referring Physician/Provider: Milon Dikes, MD Date: 06/01/2023 Duration: 27.52 mins Patient history: 67yo M with ams getting eeg to evaluate for seizure. Level of alertness: Awake AEDs during EEG study: Valium Technical aspects: This EEG study was done with scalp electrodes positioned according to the 10-20 International system of electrode placement. Electrical activity was reviewed with band pass filter of 1-70Hz , sensitivity of 7 uV/mm, display speed of 68mm/sec with a 60Hz  notched filter applied as appropriate. EEG data were recorded continuously and digitally stored.  Video monitoring was  available and reviewed as appropriate. Description: No clear posterior dominant rhythm was seen. There is an excessive amount of 15 to 18 Hz beta activity distributed symmetrically and diffusely. Hyperventilation and photic stimulation were not performed.   ABNORMALITY - Excessive beta, generalized IMPRESSION: This study is within normal limits. The excessive beta activity seen in the background is most likely due to the effect of benzodiazepine and is a benign EEG pattern. No seizures or epileptiform discharges were seen throughout the recording. A normal interictal EEG does not exclude the diagnosis of epilepsy. Priyanka [name]    EKG: Normal sinus rhythm with Premature atrial complexes and premature ventricular contractions   Assessment and Plan: Sepsis secondary to UTI [facility]) This was diagnosed at [facility] on May 29, 2023.  Patient is s/p broad-spectrum antibiotics including vancomycin cefepime and Flagyl.  There was imaging done of the chest abdomen pelvis which was nonactionable.  Patient's white count has since then come down.  Per discharge summary patient has not been started not been continued on any antibiotic at this time  I will check CBC with differential in the morning.  Patient's urine culture was pansensitive for E. coli.  His blood culture 1 set grew Staph epidermidis.  At this time patient has finished antibiotics I will go ahead and repeat a blood culture to make sure were not dealing with an occult infection.  Especially given the temperature of 100.1 today.  Patient was tested for COVID-19, SARS as well as RSV which was negative on initial hospitalization on August 4  Altered mental status This has been going on for a few days to week.  Unclear timeline.  It is noted that patient has had occasional seizure-like activity but MRI brain is negative, has received benzodiazepines, is currently on scheduled benzodiazepines that will be continued.  And IV Ativan will be given for seizures  more than 5 minutes.  Patient is on a couple of antidepressants including bupropion as well as fluoxetine.  At this time I think we should hold off on these given that the patient is having alteration of mental status, and has a temperature of 100.1.  AMS was initially ascribed to toxic metabolic encephalopathy in the setting of urinary tract infection, however patient's white count has come down platelets have come down and mental status alteration has persisted. cEEG ordered by neurology.  Anxiety disorder I will request psych eval for management of pscyh meds.   Conjuctival injection - patient noted to keep eyes open for prolonged periods, without blinking. Will order eye care. D.w. RN. Shut eyes as mch as possible. Concern for catatonia like presentation.  COPD with acute exacerbation -diagnosed in last hospitalisat at [facility] Resolved. Continue scheduled and as needed bronchodilator therapy Continue Singulair Oxygen as needed I will check ABG to ensure we are not missing occult hypoxia.       Lumbar radiculopathy Chronic pain syndrome  Holding off on pain meds for now until mental status improves.      Advance Care Planning:   Code Status: Prior full code.  Consults: neurology.  Family Communication: na  Severity of Illness: The appropriate patient status for this patient is INPATIENT. Inpatient status is judged to be reasonable and necessary in order to provide the required intensity of service to ensure the patient's safety. The patient's presenting symptoms, physical exam findings, and initial radiographic and laboratory data in the context of their chronic comorbidities is felt to place them at high risk for further clinical deterioration. Furthermore, it is not anticipated that the patient will be medically stable for discharge from the hospital within 2 midnights of admission.   * I certify that at the point of admission it is my clinical judgment that the patient will  require inpatient hospital care spanning beyond 2 midnights from the point of admission due to high intensity of service, high risk for further deterioration and high frequency of surveillance required.*  Author: Nolberto Hanlon, MD 06/02/2023 9:13 PM  For on call review [email or web address].

## 2023-06-02 NOTE — Consult Note (Signed)
Psychiatry consulted due to "History of anxiety and depression. Ran out of his meds. Complains of having dark thoughts."   HPI: Patient seen face to face by this provider, consulted with attending physician Dr. Joylene Igo; and chart reviewed on 06/02/23. Clarence Hamilton, a 67 year old patient, with history significant for anxiety, depression, chronic pain, presented to the emergency department on August 4th with altered mental status, lethargy, and one-word answers. He reported experiencing dark thoughts and anxiety. The patient was subsequently admitted critical care unit for possible sepsis secondary to UTI and toxic metabolic encephalopathy.   On approach today, the patient was lying in bed, appeared tremulous, wearing a hospital gown and a nasal cannula for supplemental oxygen. He did not respond to questions and does not respond to prompts to nod his head yes or no, stares blankly. The attending nurse reported the patient has been exhibiting slow or delayed responses, and occasional delayed one-word answers of yes or no. He has been unresponsive to most questions. The nurse reported unequal pupils last night, and flaccid bilateral upper extremities. The nurse reported recent CT and MRI, both of which were reportedly negative. The nurse reported patient is currently being followed by Neurology with plans to transfer to [facility] for a higher level of care. I communicated with the attending physician via secure chat who reported patient demonstrated delayed responses this morning and confirmed the transfer to [facility].   On chart review - The patient is currently being followed by Neurology who has recommended patient be transferred to [facility] for long-term EEG monitoring due to recurrent seizure-like activity.   Plan - Psychiatry will sign off as the patient continue to receive care under the neurology and medical teams. Please feel free to re-consult when medically  appropriate if necessary.    H. Willeen Cass, NP 06/02/2023

## 2023-06-02 NOTE — Assessment & Plan Note (Signed)
I will request psych eval for management of pscyh meds.

## 2023-06-02 NOTE — Progress Notes (Signed)
0110 Noted pt. having facial and tongue twitching ,he is alert and able to answer question and follow commands ,denies and pain nor difficulty breathing .Asked charge nurse to evaluate patient too.Placed  a call to on call MD  and inform of the above with new orders made and carried out.Marland [id number] Dr. Arville Care came @ bedside see and examined pt.

## 2023-06-02 NOTE — Assessment & Plan Note (Addendum)
This was diagnosed at [facility] on May 29, 2023.  Patient is s/p broad-spectrum antibiotics including vancomycin cefepime and Flagyl.  There was imaging done of the chest abdomen pelvis which was nonactionable.  Patient's white count has since then come down.  Per discharge summary patient has not been started not been continued on any antibiotic at this time  I will check CBC with differential in the morning.  Patient's urine culture was pansensitive for E. coli.  His blood culture 1 set grew Staph epidermidis.  At this time patient has finished antibiotics I will go ahead and repeat a blood culture to make sure were not dealing with an occult infection.  Especially given the temperature of 100.1 today.  Patient was tested for COVID-19, SARS as well as RSV which was negative on initial hospitalization on August 4. Check for tick borne illnesses (send out)

## 2023-06-02 NOTE — Plan of Care (Signed)
  Problem: Coping: Goal: Ability to adjust to condition or change in health will improve Outcome: Progressing   Problem: Fluid Volume: Goal: Ability to maintain a balanced intake and output will improve Outcome: Progressing   Problem: Health Behavior/Discharge Planning: Goal: Ability to identify and utilize available resources and services will improve Outcome: Progressing Goal: Ability to manage health-related needs will improve Outcome: Progressing   Problem: Nutritional: Goal: Maintenance of adequate nutrition will improve Outcome: Progressing Goal: Progress toward achieving an optimal weight will improve Outcome: Progressing   Problem: Skin Integrity: Goal: Risk for impaired skin integrity will decrease Outcome: Progressing   Problem: Tissue Perfusion: Goal: Adequacy of tissue perfusion will improve Outcome: Progressing   Problem: Health Behavior/Discharge Planning: Goal: Ability to manage health-related needs will improve Outcome: Progressing   Problem: Clinical Measurements: Goal: Diagnostic test results will improve Outcome: Progressing Goal: Cardiovascular complication will be avoided Outcome: Progressing

## 2023-06-02 NOTE — Evaluation (Signed)
Physical Therapy Evaluation Patient Details Name: Clarence Hamilton MRN: [id number] DOB: [birth year] Today's Date: 06/02/2023  History of Present Illness  Pt is a 67 yo male that presented to ED for AMS. Workup for acute toxic metabolic encephalopathy, sepsis secondary to UTI, seizure like activity, AKI. PMH of asthma/COPD, HTN, prediabetes, HLD, chronic pain syndrome, anxiety, depression.   Clinical Impression  Pt alert, oriented to self and place when provided with choices. Able to state he was in the hospital at end of session (oriented at beginning of session). Per pt report he was independent at baseline, lives alone.  Pt able to follow one step commands (intermittently with repetition). Facilitation to initiate mobility tasks initially step by step cueing decreasing to min verbal/visual cues. Improved command following and interaction with therapist noticed as session progressed. Bed mobility, transfers, and ambulation with minA, he was able to ambulate ~23ft with close chair follow.  Overall the patient demonstrated deficits (see "PT Problem List") that impede the patient's functional abilities, safety, and mobility and would benefit from skilled PT intervention. Recommendation is to continue skilled PT to maximize function, safety, and mobility.       If plan is discharge home, recommend the following: Assistance with cooking/housework;Assist for transportation;Help with stairs or ramp for entrance;A lot of help with bathing/dressing/bathroom;A lot of help with walking and/or transfers   Can travel by private vehicle   No    Equipment Recommendations Other (comment) (TBD at next venue of care)  Recommendations for Other Services       Functional Status Assessment Patient has had a recent decline in their functional status and demonstrates the ability to make significant improvements in function in a reasonable and predictable amount of time.     Precautions / Restrictions  Precautions Precautions: Fall Restrictions Weight Bearing Restrictions: No      Mobility  Bed Mobility Overal bed mobility: Needs Assistance Bed Mobility: Supine to Sit     Supine to sit: Min assist     General bed mobility comments: minA for faciltiation and trunk assist    Transfers Overall transfer level: Needs assistance Equipment used: Rolling walker (2 wheels) Transfers: Sit to/from Stand Sit to Stand: Min assist           General transfer comment: minA for faciltation despite one step command. first attempt with minA for steadying due to posterior lean, second attempt minA for RW steadying    Ambulation/Gait Ambulation/Gait assistance: Contact guard assist, Min assist Gait Distance (Feet): 40 Feet Assistive device: Rolling walker (2 wheels)         General Gait Details: initially minA for RW assist and initation of steps, decreased to CGA. pt needed step by step cueing decreasing to mod verbal cues  Stairs            Wheelchair Mobility     Tilt Bed    Modified Rankin (Stroke Patients Only)       Balance Overall balance assessment: Needs assistance Sitting-balance support: Feet supported Sitting balance-Leahy Scale: Good     Standing balance support: Reliant on assistive device for balance Standing balance-Leahy Scale: Fair Standing balance comment: reliant on RW, able to statically stand with CGA, BUE support                             Pertinent Vitals/Pain Pain Assessment Pain Assessment: No/denies pain    Home Living Family/patient expects to be discharged to:: Private residence Living  Arrangements: Alone   Type of Home: House         Home Layout: One level Home Equipment: None      Prior Function Prior Level of Function : Independent/Modified Independent;Driving                     Extremity/Trunk Assessment   Upper Extremity Assessment Upper Extremity Assessment: Generalized weakness     Lower Extremity Assessment Lower Extremity Assessment:  (able to move BLE against gravity)       Communication   Communication Communication: No apparent difficulties Cueing Techniques: Verbal cues;Tactile cues;Gestural cues;Visual cues  Cognition Arousal: Alert Behavior During Therapy: WFL for tasks assessed/performed Overall Cognitive Status: No family/caregiver present to determine baseline cognitive functioning Area of Impairment: Orientation, Attention, Following commands, Safety/judgement, Awareness, Problem solving                 Orientation Level: Time, Situation     Following Commands: Follows one step commands consistently     Problem Solving: Slow processing, Decreased initiation, Requires verbal cues, Requires tactile cues          General Comments      Exercises     Assessment/Plan    PT Assessment Patient needs continued PT services  PT Problem List Decreased strength;Decreased coordination;Decreased range of motion;Decreased activity tolerance;Decreased balance;Decreased mobility;Decreased knowledge of precautions;Decreased knowledge of use of DME;Decreased safety awareness       PT Treatment Interventions DME instruction;Neuromuscular re-education;Gait training;Stair training;Patient/family education;Functional mobility training;Therapeutic activities;Therapeutic exercise;Balance training    PT Goals (Current goals can be found in the Care Plan section)  Acute Rehab PT Goals PT Goal Formulation: Patient unable to participate in goal setting Time For Goal Achievement: 06/16/23 Potential to Achieve Goals: Good    Frequency Min 1X/week     Co-evaluation               AM-PAC PT "6 Clicks" Mobility  Outcome Measure Help needed turning from your back to your side while in a flat bed without using bedrails?: A Lot Help needed moving from lying on your back to sitting on the side of a flat bed without using bedrails?: A Little Help  needed moving to and from a bed to a chair (including a wheelchair)?: A Little Help needed standing up from a chair using your arms (e.g., wheelchair or bedside chair)?: A Little Help needed to walk in hospital room?: A Little Help needed climbing 3-5 steps with a railing? : Total 6 Click Score: 15    End of Session Equipment Utilized During Treatment: Gait belt Activity Tolerance: Patient tolerated treatment well Patient left: in chair;with call bell/phone within reach;with chair alarm set Nurse Communication: Mobility status PT Visit Diagnosis: Other abnormalities of gait and mobility (R26.89);Muscle weakness (generalized) (M62.81);Difficulty in walking, not elsewhere classified (R26.2)    Time: 7829-5621 PT Time Calculation (min) (ACUTE ONLY): 30 min   Charges:   PT Evaluation $PT Eval Moderate Complexity: 1 Mod PT Treatments $Therapeutic Activity: 23-37 mins PT General Charges $$ ACUTE PT VISIT: 1 Visit        Olga Coaster PT, DPT 1:49 PM,06/02/23

## 2023-06-02 NOTE — Plan of Care (Signed)

## 2023-06-02 NOTE — Assessment & Plan Note (Addendum)
This has been going on for a few days to week.  Unclear timeline.  It is noted that patient has had occasional seizure-like activity but MRI brain is negative, has received benzodiazepines, is currently on scheduled benzodiazepines that will be continued.  And IV Ativan will be given for seizures more than 5 minutes.  Patient is on a couple of antidepressants including bupropion as well as fluoxetine.  At this time I think we should hold off on these given that the patient is having alteration of mental status, and has a temperature of 100.1.  AMS was initially ascribed to toxic metabolic encephalopathy in the setting of urinary tract infection, however patient's white count has come down platelets have come down and mental status alteration has persisted. cEEG ordered by neurology.

## 2023-06-02 NOTE — Progress Notes (Signed)
Neurology Progress Note   S:// Seen and examined.  Overnight concern for tongue twitching.  No gaze preference per the hospitalist note.  Oriented at the time.  Given 1 mg p.o. Xanax.  Blood sugar was normal.   O:// Current vital signs: BP (!) 166/86   Pulse 100   Temp 98.5 F (36.9 C) (Oral)   Resp (!) 28   Ht 5\' 8"  (1.727 m)   Wt 96.2 kg   SpO2 96%   BMI 32.25 kg/m  Vital signs in last 24 hours: Temp:  [98.1 F (36.7 C)-99 F (37.2 C)] 98.5 F (36.9 C) (08/08 0400) Pulse Rate:  [54-102] 100 (08/08 0800) Resp:  [16-29] 28 (08/08 0800) BP: (138-172)/(67-93) 166/86 (08/08 0800) SpO2:  [93 %-100 %] 96 % (08/08 0800) Weight:  [96.2 kg] 96.2 kg (08/08 0500) GEN: Sickly looking man in bed in no acute distress HEENT: Normocephalic atraumatic, bilateral conjunctival erythema CVS: Regular rhythm Respiratory: Infrequent scattered rales Abdomen nondistended nontender Extremities warm well-perfused Neurological exam Awake, oriented to self.  Minimal verbal output today.  Able to tell his name. Able to tell me he is in the hospital. Could not tell me date or month. No gross dysarthria noted in the minimal speech that he generated. Able to follow simple commands Cranial nerves II to XII intact Motor examination with no drift on the outstretched arms but has minimal asterixis still.  Normal tone.  Unable to raise his legs against gravity.  Able to wiggle toes. Sensation intact bilaterally.  Coordination difficult to assess.  Medications  Current Facility-Administered Medications:    Chlorhexidine Gluconate Cloth 2 % PADS 6 each, 6 each, Topical, QHS, Erin Fulling, MD, 6 each at 06/01/23 2129   cyanocobalamin (VITAMIN B12) tablet 1,000 mcg, 1,000 mcg, Oral, Daily, Ouma, Hubbard Hartshorn, NP, 1,000 mcg at 06/02/23 0834   dextrose 5% in lactated ringers with KCl 20 mEq/L infusion, , Intravenous, Continuous, Ouma, Hubbard Hartshorn, NP, Last Rate: 100 mL/hr at 06/02/23 0500, Infusion  Verify at 06/02/23 0500   diazepam (VALIUM) injection 2.5 mg, 2.5 mg, Intravenous, Q6H, Kasa, Kurian, MD, 2.5 mg at 06/02/23 0834   docusate sodium (COLACE) capsule 100 mg, 100 mg, Oral, BID PRN, Jimmye Norman, NP   enoxaparin (LOVENOX) injection 40 mg, 40 mg, Subcutaneous, Q24H, Kasa, Kurian, MD, 40 mg at 06/01/23 2129   feeding supplement (ENSURE ENLIVE / ENSURE PLUS) liquid 237 mL, 237 mL, Oral, TID BM, Kasa, Kurian, MD, 237 mL at 06/01/23 2003   insulin aspart (novoLOG) injection 0-9 Units, 0-9 Units, Subcutaneous, Q4H, Ouma, Hubbard Hartshorn, NP, 1 Units at 06/01/23 0811   ipratropium-albuterol (DUONEB) 0.5-2.5 (3) MG/3ML nebulizer solution 3 mL, 3 mL, Nebulization, Q6H PRN, Jimmye Norman, NP, 3 mL at 05/30/23 1951   ipratropium-albuterol (DUONEB) 0.5-2.5 (3) MG/3ML nebulizer solution 3 mL, 3 mL, Nebulization, BID, Belia Heman, Kurian, MD, 3 mL at 06/02/23 0715   multivitamin with minerals tablet 1 tablet, 1 tablet, Oral, Daily, Erin Fulling, MD, 1 tablet at 06/02/23 9604   Oral care mouth rinse, 15 mL, Mouth Rinse, PRN, Agbata, Tochukwu, MD   polyethylene glycol (MIRALAX / GLYCOLAX) packet 17 g, 17 g, Oral, Daily PRN, Ouma, Hubbard Hartshorn, NP   thiamine (VITAMIN B1) tablet 100 mg, 100 mg, Oral, Daily, Tressie Ellis, RPH, 100 mg at 06/02/23 0833  Labs CBC    Component Value Date/Time   WBC 13.2 (H) 06/01/2023 0412   RBC 3.10 (L) 06/01/2023 0412   HGB 9.4 (L) 06/01/2023 5409  HGB 13.2 02/26/2022 1606   HCT 26.7 (L) 06/01/2023 0412   HCT 39.6 02/26/2022 1606   PLT 666 (H) 06/01/2023 0412   PLT 379 02/26/2022 1606   MCV 86.1 06/01/2023 0412   MCV 88 02/26/2022 1606   MCH 30.3 06/01/2023 0412   MCHC 35.2 06/01/2023 0412   RDW 14.7 06/01/2023 0412   RDW 13.3 02/26/2022 1606    CMP     Component Value Date/Time   NA 135 06/02/2023 0405   NA 136 02/26/2022 1606   K 3.9 06/02/2023 0405   CL 100 06/02/2023 0405   CO2 25 06/02/2023 0405   GLUCOSE 105 (H)  06/02/2023 0405   BUN 17 06/02/2023 0405   BUN 21 02/26/2022 1606   CREATININE 1.13 06/02/2023 0405   CREATININE 1.05 10/13/2017 1447   CALCIUM 8.5 (L) 06/02/2023 0405   PROT 6.4 (L) 06/01/2023 0412   PROT 7.4 02/26/2022 1606   ALBUMIN 3.2 (L) 06/01/2023 0412   ALBUMIN 4.6 02/26/2022 1606   AST 45 (H) 06/01/2023 0412   ALT 58 (H) 06/01/2023 0412   ALKPHOS 70 06/01/2023 0412   BILITOT 0.9 06/01/2023 0412   BILITOT 0.5 02/26/2022 1606   GFRNONAA >60 06/02/2023 0405   GFRNONAA 77 10/13/2017 1447   GFRAA 79 02/20/2020 1037   GFRAA 89 10/13/2017 1447    Lipid Panel     Component Value Date/Time   CHOL 225 (H) 02/26/2022 1606   TRIG 285 (H) 02/26/2022 1606   HDL 34 (L) 02/26/2022 1606   CHOLHDL 6.6 (H) 02/26/2022 1606   CHOLHDL 3.1 10/13/2017 1447   LDLCALC 139 (H) 02/26/2022 1606   LDLCALC 83 10/13/2017 1447    Lab Results  Component Value Date   HGBA1C 5.6 05/30/2023  B12 954 Ammonia-18 Hepatitis panel negative RPR in process Vitamin B1, B6 and D-pending  Imaging I have reviewed images in epic and the results pertinent to this consultation are: MRI brain without contrast: No acute changes.  Assessment: 67 year old man with hypertension, hyperlipidemia, obesity, COPD on 3 L at home with recurrent flares nontolerant to steroids, lumbar radiculopathy, chronic pain, anxiety and depression presented for evaluation of altered mental status going on for few days to weeks as well as unintentional weight loss of nearly 10% of his body weight in less than a month. He was noted to have lactic acidosis and sepsis and concerns for refeeding syndrome due to multiple derangements in his electrolytes. There was concern for seizure activity the day prior and also yesterday. I have not witnessed any of these events.  EEG was done which only showed excessive beta consistent with benzo administration. His exam is essentially encephalopathic and nonfocal but has some asterixis.  Imaging  unremarkable.  Ammonia and liver function not significantly deranged. This is all likely toxic metabolic encephalopathy but I still do not have a good explanation of why he is having the seizure-like episodes. He was on multiple medications for his psychiatric condition as well as pain meds at home and might be experiencing some withdrawal. I would recommend LTM EEG for further characterization of the spells.  Recommendations: Continue management of sepsis and UTI per primary team Transfer to [facility] for LTM EEG Continue diazepam No antiepileptics for now Use IV Ativan for any seizure more than 5 minutes Maintain seizure precautions I have notified the neurohospitalist at [facility] about this patient.  Neurology team will get him hooked up to LTM EEG and will follow tomorrow at [facility] with you. Plan relayed to Dr.Agbata  -- Milon Dikes, MD Neurologist [facility] Pager: [phone]

## 2023-06-03 DIAGNOSIS — N390 Urinary tract infection, site not specified: Secondary | ICD-10-CM

## 2023-06-03 DIAGNOSIS — R4182 Altered mental status, unspecified: Secondary | ICD-10-CM | POA: Diagnosis not present

## 2023-06-03 DIAGNOSIS — E440 Moderate protein-calorie malnutrition: Secondary | ICD-10-CM | POA: Diagnosis not present

## 2023-06-03 DIAGNOSIS — R569 Unspecified convulsions: Secondary | ICD-10-CM | POA: Diagnosis not present

## 2023-06-03 DIAGNOSIS — A419 Sepsis, unspecified organism: Secondary | ICD-10-CM

## 2023-06-03 DIAGNOSIS — F419 Anxiety disorder, unspecified: Secondary | ICD-10-CM

## 2023-06-03 LAB — GLUCOSE, CAPILLARY
Glucose-Capillary: 103 mg/dL — ABNORMAL HIGH (ref 70–99)
Glucose-Capillary: 113 mg/dL — ABNORMAL HIGH (ref 70–99)
Glucose-Capillary: 88 mg/dL (ref 70–99)
Glucose-Capillary: 94 mg/dL (ref 70–99)
Glucose-Capillary: 97 mg/dL (ref 70–99)

## 2023-06-03 MED ORDER — LORATADINE 10 MG PO TABS
10.0000 mg | ORAL_TABLET | Freq: Every day | ORAL | Status: DC
  Administered 2023-06-03 – 2023-06-14 (×12): 10 mg via ORAL
  Filled 2023-06-03 (×12): qty 1

## 2023-06-03 MED ORDER — DIAZEPAM 5 MG PO TABS
20.0000 mg | ORAL_TABLET | Freq: Three times a day (TID) | ORAL | Status: DC
  Administered 2023-06-03 – 2023-06-05 (×6): 20 mg via ORAL
  Filled 2023-06-03 (×6): qty 4

## 2023-06-03 MED ORDER — THIAMINE MONONITRATE 100 MG PO TABS
100.0000 mg | ORAL_TABLET | Freq: Every day | ORAL | Status: AC
  Administered 2023-06-03 – 2023-06-07 (×5): 100 mg via ORAL
  Filled 2023-06-03 (×5): qty 1

## 2023-06-03 MED ORDER — ENSURE ENLIVE PO LIQD
237.0000 mL | Freq: Three times a day (TID) | ORAL | Status: DC
  Administered 2023-06-03 – 2023-06-09 (×14): 237 mL via ORAL

## 2023-06-03 MED ORDER — ADULT MULTIVITAMIN W/MINERALS CH
1.0000 | ORAL_TABLET | Freq: Every day | ORAL | Status: DC
  Administered 2023-06-03 – 2023-06-14 (×12): 1 via ORAL
  Filled 2023-06-03 (×12): qty 1

## 2023-06-03 MED ORDER — CIPROFLOXACIN HCL 0.3 % OP SOLN
1.0000 [drp] | OPHTHALMIC | Status: DC
  Administered 2023-06-03 – 2023-06-10 (×35): 1 [drp] via OPHTHALMIC
  Filled 2023-06-03 (×2): qty 2.5

## 2023-06-03 MED ORDER — FLUTICASONE PROPIONATE 50 MCG/ACT NA SUSP
1.0000 | Freq: Every day | NASAL | Status: DC
  Administered 2023-06-03 – 2023-06-14 (×12): 1 via NASAL
  Filled 2023-06-03: qty 16

## 2023-06-03 MED ORDER — POLYVINYL ALCOHOL 1.4 % OP SOLN
1.0000 [drp] | OPHTHALMIC | Status: DC | PRN
  Administered 2023-06-06 – 2023-06-14 (×8): 1 [drp] via OPHTHALMIC
  Filled 2023-06-03: qty 15

## 2023-06-03 NOTE — Progress Notes (Addendum)
Subjective: No acute events overnight.  Denies any seizure-like activity.  States he feels well.  Asking to sit in the chair.  ROS: negative except above Examination  Vital signs in last 24 hours: Temp:  [98.1 F (36.7 C)-100.1 F (37.8 C)] 98.2 F (36.8 C) (08/09 0912) Pulse Rate:  [61-100] 69 (08/09 0912) Resp:  [0-30] 18 (08/09 0912) BP: (137-182)/(71-101) 142/86 (08/09 0912) SpO2:  [96 %-100 %] 100 % (08/09 0912)  General: lying in bed, NAD Neuro: MS: Alert, oriented, follows commands CN: pupils equal and reactive,  EOMI, face symmetric, tongue midline, normal sensation over face, Motor: 5/5 strength in all 4 extremities Reflexes: 2+ bilaterally over patella, biceps, plantars: flexor Coordination: normal Gait: not tested  Basic Metabolic Panel: Recent Labs  Lab 05/30/23 0319 05/30/23 1017 05/30/23 1714 05/31/23 0349 05/31/23 1259 05/31/23 1632 06/01/23 0412 06/02/23 0405 06/03/23 0530  NA 128* 130*   < > 131*  --  136 139 135 136  K <2.0* <2.0*   < > 2.8* 3.4* 3.7 3.1* 3.9 4.1  CL 89* 93*   < > 97*  --  101 102 100 99  CO2 23 23   < > 26  --  24 27 25 26   GLUCOSE 113* 128*   < > 116*  --  105* 116* 105* 94  BUN 28* 23   < > 19  --  18 21 17 19   CREATININE 1.80* 1.71*   < > 1.28*  --  1.32* 1.24 1.13 1.23  CALCIUM 8.1* 7.8*   < > 7.7*  --  8.9 8.7* 8.5* 8.5*  MG 1.6* 2.8*  --  2.3  --  2.2 2.2 1.8  --   PHOS <1.0*  --   --  3.3  --  2.6 3.0 3.3  --    < > = values in this interval not displayed.    CBC: Recent Labs  Lab 05/29/23 1618 05/30/23 0319 05/31/23 0349 06/01/23 0412 06/03/23 0530  WBC 19.6* 21.5* 12.5* 13.2* 15.9*  NEUTROABS  --   --   --   --  12.6*  HGB 11.1* 9.9* 8.4* 9.4* 7.9*  HCT 30.2* 26.7* 22.8* 26.7* 23.7*  MCV 82.5 81.4 82.9 86.1 90.5  PLT 725* 714* 570* 666* 507*     Coagulation Studies: Recent Labs    06/03/23 0530  LABPROT 15.4*  INR 1.2    Imaging MRI brain without contrast 06/01/2023: No acute intracranial  abnormality. Findings of chronic small vessel ischemia and volume loss.  CT head without contrast 06/02/2023: Stable noncontrast head CT with no acute intracranial pathology.   ASSESSMENT AND PLAN: 67 year old male admitted to the hospital due to unintentional weight loss of nearly 10% of his body weight in less than a month.  Was noted to be lactic acidosis and multiple derangements in electrolytes concerning for refeeding syndrome.  While in the hospital he was noted to be encephalopathic and had seizure-like activity, therefore transferred to [facility] for long-term EEG  Acute encephalopathy, toxic-metabolic Seizure-like activity -LTM EEG does not show any active-interictal activity so far.  Encephalopathy appears to have resolved.  Recommendations -Will continue EEG monitoring for 24 more hours.  If no seizures, will DC tomorrow -On Xanax, reportedly continuing of home dose -Will not start any further antiseizure medications -Continue seizure precautions -As needed IV ativan for clinical seizures - Management of rest of comorbidities per primary team   I have spent a total of  26  minutes  with the patient reviewing hospital notes,  test results, labs and examining the patient as well as establishing an assessment and plan that was discussed personally with the patient.  > 50% of time was spent in direct patient care.      Lindie Spruce Epilepsy [facility] For questions after 5pm please refer to AMION to reach the Neurologist on call

## 2023-06-03 NOTE — Plan of Care (Signed)
  Problem: Education: Goal: Knowledge of General Education information will improve Description: Including pain rating scale, medication(s)/side effects and non-pharmacologic comfort measures Outcome: Progressing   Problem: Clinical Measurements: Goal: Will remain free from infection Outcome: Progressing   Problem: Activity: Goal: Risk for activity intolerance will decrease Outcome: Progressing   Problem: Nutrition: Goal: Adequate nutrition will be maintained Outcome: Progressing Note: He more alert and he is eating more feeling better.    Problem: Elimination: Goal: Will not experience complications related to bowel motility Outcome: Progressing Goal: Will not experience complications related to urinary retention Outcome: Progressing   Problem: Safety: Goal: Ability to remain free from injury will improve Outcome: Progressing   Problem: Education: Goal: Ability to describe self-care measures that may prevent or decrease complications (Diabetes Survival Skills Education) will improve Outcome: Progressing   Problem: Metabolic: Goal: Ability to maintain appropriate glucose levels will improve Outcome: Progressing

## 2023-06-03 NOTE — Evaluation (Signed)
Clinical/Bedside Swallow Evaluation Patient Details  Name: Clarence Hamilton MRN: [id number] Date of Birth: 1956/01/19  Today's Date: 06/03/2023 Time: SLP Start Time (ACUTE ONLY): 0830 SLP Stop Time (ACUTE ONLY): 0840 SLP Time Calculation (min) (ACUTE ONLY): 10 min  Past Medical History:  Past Medical History:  Diagnosis Date   Anxiety    Arthritis    Asthma    History of chicken pox    Hyperglycemia    Hyperlipidemia    Hypertension    Obesity    Past Surgical History:  Past Surgical History:  Procedure Laterality Date   HERNIA REPAIR  1970's   Inguinal hernia repair x 2    TONSILLECTOMY AND ADENOIDECTOMY  1960's   HPI:  Pt is a 67 year old male admitted with sepsis secondary to UTI, Acute toxic metabolic encephalopathy, Seizures, AKI      Pmh significant for asthma/COPD On oxygen at 3 liters. recurrent flares, intolerant to steroids, HTN, HLD, Prediabetes,  Lumbar radiculopathy, chronic pain syndrome,anxiety and depression    Assessment / Plan / Recommendation  Clinical Impression  Pt alert, conversive and appropriate.Oromotor is intact as well as dentition- he is missing 1-2  teeth. He consumed approximately 3 oz of water consistently without s/s aspiration. At end of study he did have an additional 2 oz with one delayed cough. He denied prior or current swallow difficulties stating he has a "bit of congestion.". Oral phase he was able to masticate regular texture timely without residual. Recommend regular texture, thin liquids, pills with water in upright position and discussed with RN. No further ST needed. SLP Visit Diagnosis: Dysphagia, unspecified (R13.10)    Aspiration Risk  Mild aspiration risk    Diet Recommendation Regular;Thin liquid    Liquid Administration via: Straw;Cup Medication Administration: Whole meds with liquid Supervision: Patient able to self feed Compensations: Slow rate;Small sips/bites Postural Changes: Seated upright at 90 degrees    Other   Recommendations Oral Care Recommendations: Oral care BID    Recommendations for follow up therapy are one component of a multi-disciplinary discharge planning process, led by the attending physician.  Recommendations may be updated based on patient status, additional functional criteria and insurance authorization.  Follow up Recommendations No SLP follow up      Assistance Recommended at Discharge    Functional Status Assessment Patient has not had a recent decline in their functional status  Frequency and Duration            Prognosis        Swallow Study   General Date of Onset: 06/03/23 HPI: Pt is a 67 year old male admitted with sepsis secondary to UTI, Acute toxic metabolic encephalopathy, Seizures, AKI      Pmh significant for asthma/COPD On oxygen at 3 liters. recurrent flares, intolerant to steroids, HTN, HLD, Prediabetes,  Lumbar radiculopathy, chronic pain syndrome,anxiety and depression Type of Study: Bedside Swallow Evaluation Previous Swallow Assessment:  (none) Diet Prior to this Study: NPO Temperature Spikes Noted: No Respiratory Status: Nasal cannula History of Recent Intubation: No Behavior/Cognition: Alert;Cooperative;Pleasant mood Oral Cavity Assessment: Within Functional Limits Oral Care Completed by SLP: No Oral Cavity - Dentition: Adequate natural dentition (missing 1 or 2) Vision: Functional for self-feeding Self-Feeding Abilities: Able to feed self Patient Positioning: Upright in bed Baseline Vocal Quality: Normal Volitional Cough: Strong Volitional Swallow: Able to elicit    Oral/Motor/Sensory Function Overall Oral Motor/Sensory Function: Within functional limits   Ice Chips Ice chips: Not tested   Thin Liquid Thin  Liquid: Impaired Presentation: Straw Pharyngeal  Phase Impairments: Cough - Delayed    Nectar Thick Nectar Thick Liquid: Not tested   Honey Thick Honey Thick Liquid: Not tested   Puree Puree: Within functional limits   Solid      Solid: Within functional limits      Royce Macadamia 06/03/2023,8:49 AM
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
## 2023-06-03 NOTE — Progress Notes (Signed)
PROGRESS NOTE    Clarence Hamilton  WPV:[id number] DOB: 11-Dec-1955 DOA: 06/02/2023 PCP: Malva Limes, MD    Brief Narrative:  Clarence Hamilton is a 67 y.o. male with medical history significant of mild persistent Asthma/COPD On oxygen at 3 liters, intolerant to steroids, HTN, HLD, Prediabetes, Lumbar radiculopathy, chronic pain syndrome,anxiety and depression Hospital initially to [facility] on May 29, 2023 with altered mental status when he was noted to be lethargic and had 1 word answers.  Initial labs showed hyponatremia with sodium of 125 with potassium low at 2.0 with leukocytosis at 19.6.  Had platelet count of 725 and urine drug screen was positive for opiates benzos and cannabis.  Patient was given broad-spectrum antibiotic and was admitted hospital for possible sepsis secondary to UTI but despite antibiotic treatment patient continued to have altered mental status and had an episodic seizure-like activity.  MRI of the brain was negative but then due to recurrent seizure-like activity patient was transferred to [facility].    Assessment and Plan:  Sepsis secondary to UTI  Diagnosed on initial admission at [facility].  Status post vancomycin cefepime Flagyl.  CT scan of the chest abdomen pelvis was nonacute.  Thrombocytosis improving.  Review of urine cultures showed  pansensitive for E. coli.  Patient has completed course of antibiotic.  Had a spike of fever so repeat blood cultures were sent 06/03/2023 negative in less than 12 hours.  Still has mild leukocytosis at 15.9.  Lyme disease serology has been sent.  Anemia.  Hemoglobin of 7.9.  No reported blood loss.  Will continue to monitor.   Altered mental status, concern for seizures. Unclear timeline but almost a week.  Had seizure-like activity show MRI was negative.  Continue benzodiazepines.  Patient was on gabapentin as well as Flexeril.  Continue to hold at this time.  Follow blood cultures.  Recently completed course  of antibiotic.  Currently undergoing continuous EEG due to persistent altered mental status.  TSH level at 0.3.  ABG done 06/03/2023 with normal pH.    On IV Ativan for concerns for seizures. Follow-up neurology recommendations.  Anxiety disorder Psychiatry was consulted for assessing medications.    Conjuctival injection -bilaterally.  Stuffy nose.  Could be secondary to dry eyes and not blinking enough.  Will put the patient on teardrops.  Will put Cipro eyedrops bilaterally.  Will add Claritin and Flonase.  COPD with acute exacerbation -diagnosed in last hospitalisat at [facility].  Improved at this time.  Continue bronchodilators Singulair. Resolved. Continue scheduled and as needed bronchodilator therapy     DVT prophylaxis: enoxaparin (LOVENOX) injection 40 mg Start: 06/03/23 0800 SCDs Start: 06/02/23 2138   Code Status:     Code Status: Full Code  Disposition: Likely home in 1 to 2 days  Status is: Inpatient  Remains inpatient appropriate because: Altered mental status, EEG monitoring,   Family Communication: None at bedside  Consultants:  Neurology Psychiatry  Procedures:  Continuous EEG  Antimicrobials:  None currently  Anti-infectives (From admission, onward)    None      Subjective: Today, patient was seen and examined at bedside.  Patient complains of dry eyes and redness bilaterally.  Denies any headache, nausea, vomiting.  Complains of stuffiness in his nose.  Objective: Vitals:   06/02/23 1938 06/02/23 2334 06/03/23 0347 06/03/23 0912  BP: (!) 167/90 (!) 171/91 137/71 (!) 142/86  Pulse: 99 99 61 69  Resp:  16 20 18   Temp: 100.1 F (37.8  C) 99.9 F (37.7 C) 99.3 F (37.4 C) 98.2 F (36.8 C)  TempSrc: Axillary Oral Axillary Oral  SpO2: 100% 98% 100% 100%    Intake/Output Summary (Last 24 hours) at 06/03/2023 1125 Last data filed at 06/03/2023 0535 Gross per 24 hour  Intake 1342.5 ml  Output 700 ml  Net 642.5 ml   There were no vitals filed for this  visit.  Physical Examination: There is no height or weight on file to calculate BMI.  General:  Average built, not in obvious distress, EEG leads in place. HENT:   No scleral pallor or icterus noted.  Bilateral conjunctival injection, Chest:  Clear breath sounds. No crackles or wheezes.  CVS: S1 &S2 heard. No murmur.  Regular rate and rhythm. Abdomen: Soft, nontender, nondistended.  Bowel sounds are heard.   Extremities: No cyanosis, clubbing or edema.  Peripheral pulses are palpable. Psych: Alert, awake and Communicative, CNS:  No cranial nerve deficits.  Moves all extremities Skin: Warm and dry.  No rashes noted.  Data Reviewed:   CBC: Recent Labs  Lab 05/29/23 1618 05/30/23 0319 05/31/23 0349 06/01/23 0412 06/03/23 0530  WBC 19.6* 21.5* 12.5* 13.2* 15.9*  NEUTROABS  --   --   --   --  12.6*  HGB 11.1* 9.9* 8.4* 9.4* 7.9*  HCT 30.2* 26.7* 22.8* 26.7* 23.7*  MCV 82.5 81.4 82.9 86.1 90.5  PLT 725* 714* 570* 666* 507*    Basic Metabolic Panel: Recent Labs  Lab 05/30/23 0319 05/30/23 1017 05/30/23 1714 05/31/23 0349 05/31/23 1259 05/31/23 1632 06/01/23 0412 06/02/23 0405 06/03/23 0530  NA 128* 130*   < > 131*  --  136 139 135 136  K <2.0* <2.0*   < > 2.8* 3.4* 3.7 3.1* 3.9 4.1  CL 89* 93*   < > 97*  --  101 102 100 99  CO2 23 23   < > 26  --  24 27 25 26   GLUCOSE 113* 128*   < > 116*  --  105* 116* 105* 94  BUN 28* 23   < > 19  --  18 21 17 19   CREATININE 1.80* 1.71*   < > 1.28*  --  1.32* 1.24 1.13 1.23  CALCIUM 8.1* 7.8*   < > 7.7*  --  8.9 8.7* 8.5* 8.5*  MG 1.6* 2.8*  --  2.3  --  2.2 2.2 1.8  --   PHOS <1.0*  --   --  3.3  --  2.6 3.0 3.3  --    < > = values in this interval not displayed.    Liver Function Tests: Recent Labs  Lab 05/29/23 1618 05/30/23 0319 05/31/23 0349 06/01/23 0412  AST 48* 41 33 45*  ALT 73* 61* 46* 58*  ALKPHOS 101 86 72 70  BILITOT 0.7 1.1 0.6 0.9  PROT 7.3 6.8 5.6* 6.4*  ALBUMIN 3.5 3.3* 2.8* 3.2*     Radiology  Studies: Overnight EEG with video  Result Date: 06/03/2023 Charlsie Quest, MD     06/03/2023  9:41 AM Patient Name: Clarence Hamilton MRN: [id number] Epilepsy Attending: Charlsie Quest Referring Physician/Provider: Erick Blinks, MD Duration: 06/03/2023 0147 to 0930 Patient history: 67yo M with seizure like activity getting eeg to evaluate for seizure Level of alertness: Awake, asleep AEDs during EEG study: Xanax Technical aspects: This EEG study was done with scalp electrodes positioned according to the 10-20 International system of electrode placement. Electrical activity was reviewed with band pass  filter of 1-70Hz , sensitivity of 7 uV/mm, display speed of 59mm/sec with a 60Hz  notched filter applied as appropriate. EEG data were recorded continuously and digitally stored.  Video monitoring was available and reviewed as appropriate. Description: The posterior dominant rhythm consists of 9 Hz activity of moderate voltage (25-35 uV) seen predominantly in posterior head regions, symmetric and reactive to eye opening and eye closing. Sleep was characterized by vertex waves, sleep spindles (12 to 14 Hz), maximal frontocentral region. There is 15 to 18 Hz beta activity with irregular morphology distributed symmetrically and diffusely. Hyperventilation and photic stimulation were not performed.   IMPRESSION: This study is within normal limits. No seizures or epileptiform discharges were seen throughout the recording. A normal interictal EEG does not exclude the diagnosis of epilepsy. Priyanka [name]   CT HEAD WO CONTRAST ( )  Result Date: 06/02/2023 CLINICAL DATA:  Neuro deficit, stroke suspected. EXAM: CT HEAD WITHOUT CONTRAST TECHNIQUE: Contiguous axial images were obtained from the base of the skull through the vertex without intravenous contrast. RADIATION DOSE REDUCTION: This exam was performed according to the departmental dose-optimization program which includes automated exposure control, adjustment  of the mA and/or kV according to patient size and/or use of iterative reconstruction technique. COMPARISON:  Brain MRI 1 day prior FINDINGS: Brain: There is no evidence of acute intracranial hemorrhage, extra-axial fluid collection, or acute infarct. Parenchymal volume is stable. The ventricles are stable in size. Gray-white differentiation is preserved. The pituitary and suprasellar region are normal. There is no mass lesion. There is no mass effect or midline shift. Vascular: There is calcification of the bilateral carotid siphons. Skull: Normal. Negative for fracture or focal lesion. Sinuses/Orbits: There is mild mucosal thickening in the paranasal sinuses. The globes and orbits are unremarkable. Other: The mastoid air cells and middle ear cavities are clear. IMPRESSION: Stable noncontrast head CT with no acute intracranial pathology. Electronically Signed   By: Lesia Hausen M.D.   On: 06/02/2023 15:26   MR BRAIN WO CONTRAST  Result Date: 06/01/2023 CLINICAL DATA:  Altered mental status EXAM: MRI HEAD WITHOUT CONTRAST TECHNIQUE: Multiplanar, multiecho pulse sequences of the brain and surrounding structures were obtained without intravenous contrast. COMPARISON:  None Available. FINDINGS: Brain: No acute infarct, mass effect or extra-axial collection. No acute or chronic hemorrhage. There is multifocal hyperintense T2-weighted signal within the white matter. Generalized volume loss. The midline structures are normal. Vascular: Major flow voids are preserved. Skull and upper cervical spine: Normal calvarium and skull base. Visualized upper cervical spine and soft tissues are normal. Sinuses/Orbits:No paranasal sinus fluid levels or advanced mucosal thickening. No mastoid or middle ear effusion. Normal orbits. IMPRESSION: 1. No acute intracranial abnormality. 2. Findings of chronic small vessel ischemia and volume loss. Electronically Signed   By: Deatra Robinson M.D.   On: 06/01/2023 20:03   EEG adult  Result  Date: 06/01/2023 Charlsie Quest, MD     06/01/2023  2:02 PM Patient Name: Veril Harkcom Hanline MRN: [id number] Epilepsy Attending: Charlsie Quest Referring Physician/Provider: Milon Dikes, MD Date: 06/01/2023 Duration: 27.52 mins Patient history: 67yo M with ams getting eeg to evaluate for seizure. Level of alertness: Awake AEDs during EEG study: Valium Technical aspects: This EEG study was done with scalp electrodes positioned according to the 10-20 International system of electrode placement. Electrical activity was reviewed with band pass filter of 1-70Hz , sensitivity of 7 uV/mm, display speed of 34mm/sec with a 60Hz  notched filter applied as appropriate. EEG data were recorded continuously and digitally  stored.  Video monitoring was available and reviewed as appropriate. Description: No clear posterior dominant rhythm was seen. There is an excessive amount of 15 to 18 Hz beta activity distributed symmetrically and diffusely. Hyperventilation and photic stimulation were not performed.   ABNORMALITY - Excessive beta, generalized IMPRESSION: This study is within normal limits. The excessive beta activity seen in the background is most likely due to the effect of benzodiazepine and is a benign EEG pattern. No seizures or epileptiform discharges were seen throughout the recording. A normal interictal EEG does not exclude the diagnosis of epilepsy. Priyanka [name]      LOS: 1 day    Joycelyn Das, MD [facility] Available via Epic secure chat 7am-7pm After these hours, please refer to coverage provider listed on [email or web address] 06/03/2023, 11:25 AM

## 2023-06-03 NOTE — Consult Note (Incomplete)
Psychiatry consulted due to "History of anxiety and depression. Ran out of his meds. Complains of having dark thoughts."   Patient seen face to face by this provider, consulted with attending physician Dr. Joylene Igo; and chart reviewed on 06/02/23. Clarence Hamilton, a 67 year old patient presented to the emergency department on August 4th with altered mental status, lethargy, and one-word answers. He reported experiencing dark thoughts and anxiety. The patient was subsequently admitted critical care unit for possible sepsis secondary to UTI and toxic metabolic encephalopathy.    The patient has been exhibiting bizarre behaviors, slow or delayed responses, and occasional one-word answers of yes or no. He has been unresponsive to most questions and does not respond to prompts to nod his head yes or no. The patient denied being in pain when asked by the nurse. The patient's history includes a recent CT and MRI, both of which were reportedly negative. The patient is currently being followed by Neuro and is planned to be transferred to [facility] for a higher level of care.      H. Willeen Cass, NP 06/02/2023

## 2023-06-03 NOTE — Hospital Course (Signed)
Clarence Hamilton is a 67 y.o. male with medical history significant of mild persistent Asthma/COPD On oxygen at 3 liters, intolerant to steroids, HTN, HLD, Prediabetes, Lumbar radiculopathy, chronic pain syndrome,anxiety and depression Hospital initially to [facility] on May 29, 2023 with altered mental status when he was noted to be lethargic and had 1 word answers.  Initial labs showed hyponatremia with sodium of 125 with potassium low at 2.0 with leukocytosis at 19.6.  Had platelet count of 725 and urine drug screen was positive for opiates benzos and cannabis.  Patient was given broad-spectrum antibiotic and was admitted hospital for possible sepsis secondary to UTI but despite antibiotic treatment patient continued to have altered mental status and had an episodic seizure-like activity.  MRI of the brain was negative but then due to recurrent seizure-like activity patient was transferred to [facility].    Assessment and Plan:  Sepsis secondary to UTI  Diagnosed on initial admission at [facility].  Status post vancomycin cefepime Flagyl.  CT scan of the chest abdomen pelvis was nonacute.  Thrombocytosis improving.  Review of urine cultures showed  pansensitive for E. coli.  Patient has completed course of antibiotic.  Had a spike of fever so repeat blood cultures were sent 06/03/2023 negative in less than 12 hours.  Still has mild leukocytosis at 15.9.  Lyme disease serology has been sent.  Anemia.  Hemoglobin of 7.9.  No reported blood loss.  Will continue to monitor.   Altered mental status Unclear timeline but almost a week.  Had seizure-like activity show MRI was negative.  Continue benzodiazepines.  Patient was on gabapentin as well as Flexeril.  Continue to hold at this time.  Follow cultures.  Recently completed course of antibiotic.  Currently undergoing continuous EEG due to persistent altered mental status.  TSH level at 0.3.  ABG done 06/03/2023 with normal pH.  Anxiety  disorder Psychiatry was consulted for assessing medications.    Conjuctival injection -bilaterally.  Could be secondary to dry eyes and not blinking enough.  Will put the patient on teardrops.   COPD with acute exacerbation -diagnosed in last hospitalisat at [facility].  Improved at this time.  Continue bronchodilators Singulair. Resolved. Continue scheduled and as needed bronchodilator therapy

## 2023-06-03 NOTE — Progress Notes (Signed)
LTM maint complete - no skin breakdown under:  FP1, CZ

## 2023-06-03 NOTE — Progress Notes (Signed)
PM maint complete. No break down seen.

## 2023-06-03 NOTE — Procedures (Addendum)
Patient Name: [name]  MRN: [id number]  Epilepsy Attending: Charlsie Quest  Referring Physician/Provider: Erick Blinks, MD  Duration: 06/03/2023 0147 to 06/04/2023 0147  Patient history: 67yo M with seizure like activity getting eeg to evaluate for seizure  Level of alertness: Awake, asleep  AEDs during EEG study: Xanax  Technical aspects: This EEG study was done with scalp electrodes positioned according to the 10-20 International system of electrode placement. Electrical activity was reviewed with band pass filter of 1-70Hz , sensitivity of 7 uV/mm, display speed of 106mm/sec with a 60Hz  notched filter applied as appropriate. EEG data were recorded continuously and digitally stored.  Video monitoring was available and reviewed as appropriate.  Description: The posterior dominant rhythm consists of 9 Hz activity of moderate voltage (25-35 uV) seen predominantly in posterior head regions, symmetric and reactive to eye opening and eye closing. Sleep was characterized by vertex waves, sleep spindles (12 to 14 Hz), maximal frontocentral region. There is 15 to 18 Hz beta activity with irregular morphology distributed symmetrically and diffusely. Hyperventilation and photic stimulation were not performed.     IMPRESSION: This study is within normal limits. No seizures or epileptiform discharges were seen throughout the recording.  A normal interictal EEG does not exclude the diagnosis of epilepsy.   [name]

## 2023-06-03 NOTE — Progress Notes (Signed)
Initial Nutrition Assessment  DOCUMENTATION CODES:   Non-severe (moderate) malnutrition in context of social or environmental circumstances  INTERVENTION:   Ensure Enlive po TID, each supplement provides 350 kcal and 20 grams of protein. Pt prefers chocolate   Encourage PO intake   NUTRITION DIAGNOSIS:   Moderate Malnutrition related to social / environmental circumstances as evidenced by mild muscle depletion, mild fat depletion, moderate muscle depletion.  GOAL:   Patient will meet greater than or equal to 90% of their needs  MONITOR:   PO intake, Supplement acceptance  REASON FOR ASSESSMENT:   Consult Assessment of nutrition requirement/status  ASSESSMENT:   Pt with PMH of asthma/COPD on 3 L O2, HTN, HLD, prediabetes, chronic pain syndrome, anxiety, depression admitted 8/4 to [facility] with AMS. Dx sepsis due to UTI, acute toxic metabolic encephalopathy, AKI, and possible seizures, tx to [facility] for LTM. Per family report on admission pt living in home with feces, dead animals, piles of clothing on floors.    Spoke with pt who reports decreased appetite PTA. He feels he has lost weight but is unsure of how much.   Pt states he does not need a cane/walker at home but feels he may need one soon.   Medications reviewed and include: SSI NS @ 75 ml/hr   Labs reviewed:  CBG's: 97-117  NUTRITION - FOCUSED PHYSICAL EXAM:  Flowsheet Row Most Recent Value  Orbital Region Mild depletion  Upper Arm Region Mild depletion  Thoracic and Lumbar Region No depletion  Buccal Region Mild depletion  Temple Region No depletion  Clavicle Bone Region Mild depletion  Clavicle and Acromion Bone Region Mild depletion  Scapular Bone Region No depletion  Dorsal Hand No depletion  Patellar Region Moderate depletion  Anterior Thigh Region Moderate depletion  Posterior Calf Region Moderate depletion  Edema (RD Assessment) Mild  Hair Reviewed  Eyes Reviewed  Mouth Reviewed  Skin Reviewed   Nails Reviewed       Diet Order:   Diet Order             Diet regular Room service appropriate? Yes with Assist; Fluid consistency: Thin  Diet effective now                   EDUCATION NEEDS:   Education needs have been addressed  Skin:  Skin Assessment: Reviewed RN Assessment  Last BM:  8/6  Height:   Ht Readings from Last 1 Encounters:  05/29/23 5\' 8"  (1.727 m)    Weight:   Wt Readings from Last 1 Encounters:  06/02/23 96.2 kg    Ideal Body Weight:     BMI:  There is no height or weight on file to calculate BMI.  Estimated Nutritional Needs:   Kcal:  1900-2200  Protein:  95-110 grams  Fluid:  > 1.9 L/day  Cammy Copa., RD, LDN, CNSC See AMiON for contact information

## 2023-06-03 NOTE — Progress Notes (Signed)
LTM EEG hooked up and running - no initial skin breakdown - push button tested - [facility] monitoring.  

## 2023-06-04 ENCOUNTER — Encounter (HOSPITAL_COMMUNITY): Payer: Self-pay | Admitting: Internal Medicine

## 2023-06-04 ENCOUNTER — Other Ambulatory Visit: Payer: Self-pay

## 2023-06-04 DIAGNOSIS — E440 Moderate protein-calorie malnutrition: Secondary | ICD-10-CM | POA: Diagnosis not present

## 2023-06-04 DIAGNOSIS — R569 Unspecified convulsions: Secondary | ICD-10-CM | POA: Diagnosis not present

## 2023-06-04 DIAGNOSIS — A419 Sepsis, unspecified organism: Secondary | ICD-10-CM | POA: Diagnosis not present

## 2023-06-04 DIAGNOSIS — N390 Urinary tract infection, site not specified: Secondary | ICD-10-CM | POA: Diagnosis not present

## 2023-06-04 DIAGNOSIS — R4182 Altered mental status, unspecified: Secondary | ICD-10-CM | POA: Diagnosis not present

## 2023-06-04 DIAGNOSIS — F419 Anxiety disorder, unspecified: Secondary | ICD-10-CM | POA: Diagnosis not present

## 2023-06-04 LAB — GLUCOSE, CAPILLARY
Glucose-Capillary: 95 mg/dL (ref 70–99)
Glucose-Capillary: 95 mg/dL (ref 70–99)
Glucose-Capillary: 123 mg/dL — ABNORMAL HIGH (ref 70–99)
Glucose-Capillary: 125 mg/dL — ABNORMAL HIGH (ref 70–99)

## 2023-06-04 MED ORDER — POTASSIUM CHLORIDE CRYS ER 20 MEQ PO TBCR
40.0000 meq | EXTENDED_RELEASE_TABLET | Freq: Once | ORAL | Status: AC
  Administered 2023-06-04: 40 meq via ORAL
  Filled 2023-06-04: qty 2

## 2023-06-04 MED ORDER — NYSTATIN 100000 UNIT/GM EX POWD
Freq: Two times a day (BID) | CUTANEOUS | Status: DC
  Filled 2023-06-04: qty 15

## 2023-06-04 NOTE — Procedures (Addendum)
Patient Name: Clarence Hamilton  MRN: [id number]  Epilepsy Attending: Charlsie Quest  Referring Physician/Provider: Erick Blinks, MD  Duration: 06/04/2023 0147 to 06/04/2023 6962   Patient history: 67yo M with seizure like activity getting eeg to evaluate for seizure   Level of alertness: Awake, asleep   AEDs during EEG study: Valium   Technical aspects: This EEG study was done with scalp electrodes positioned according to the 10-20 International system of electrode placement. Electrical activity was reviewed with band pass filter of 1-70Hz , sensitivity of 7 uV/mm, display speed of 16mm/sec with a 60Hz  notched filter applied as appropriate. EEG data were recorded continuously and digitally stored.  Video monitoring was available and reviewed as appropriate.   Description: The posterior dominant rhythm consists of 9 Hz activity of moderate voltage (25-35 uV) seen predominantly in posterior head regions, symmetric and reactive to eye opening and eye closing. Sleep was characterized by vertex waves, sleep spindles (12 to 14 Hz), maximal frontocentral region. There is 15 to 18 Hz beta activity with irregular morphology distributed symmetrically and diffusely. Hyperventilation and photic stimulation were not performed.      IMPRESSION: This study is within normal limits. No seizures or epileptiform discharges were seen throughout the recording.   A normal interictal EEG does not exclude the diagnosis of epilepsy.    [name]

## 2023-06-04 NOTE — Progress Notes (Addendum)
PROGRESS NOTE    Clarence Hamilton  XBJ:[id number] DOB: [birth year] DOA: 06/02/2023 PCP: Malva Limes, MD    Brief Narrative:   Clarence Hamilton is a 67 y.o. male with medical history significant of mild persistent Asthma/COPD On oxygen at 3 liters, intolerant to steroids, HTN, HLD, Prediabetes, Lumbar radiculopathy, chronic pain syndrome,anxiety and depression Hospital initially to [facility] on May 29, 2023 with altered mental status when he was noted to be lethargic and had 1 word answers.  Initial labs showed hyponatremia with sodium of 125 with potassium low at 2.0 with leukocytosis at 19.6.  Had platelet count of 725 and urine drug screen was positive for opiates benzos and cannabis.  Patient was given broad-spectrum antibiotic and was admitted hospital for possible sepsis secondary to UTI but despite antibiotic treatment patient continued to have altered mental status and had an episodic seizure-like activity.  MRI of the brain was negative but then due to recurrent seizure-like activity patient was transferred to [facility].    Assessment and Plan:  Sepsis secondary to E coli UTI  Diagnosed on initial admission at [facility].  Status post vancomycin cefepime Flagyl.  CT scan of the chest abdomen pelvis was nonacute.  Thrombocytosis improving.  Review of urine cultures showed  pansensitive for E. coli.  Patient has completed course of antibiotic.  Had a spike of fever so repeat blood cultures were sent 06/03/2023, negative in 1 day.  Temperature max of 99.1 F.  Still has mild leukocytosis at 13.7 from 15.9.  Lyme disease serology has been sent and in process..  Anemia.  Hemoglobin of 8.3 today from 7.9.  No reported blood loss.  Will continue to monitor.  Mild hypokalemia.  Will replace with oral potassium.  Check levels in AM.   Altered mental status, concern for seizures. Psychiatry with an impression of possible benzodiazepine intoxication versus unspecified dementia.   Unclear timeline but had symptoms for almost a week.  Had seizure-like activity so MRI brain was obtained which was  negative for acute findings..   Patient was on gabapentin Flexeril at home which is currently on hold.  Psychiatry on board and is on gradual benzodiazepine taper.  Patient underwent a long-term EEG which was negative for seizures.  TSH level at 0.3.  ABG done 06/03/2023 with normal pH.    Currently on Valium as per psychiatry.. Follow-up neurology recommendations.  Groin rash.  Likely candidal intertrigo.  Will add nystatin powder.  Anxiety disorder Psychiatry on board and on gradual benzodiazepine taper.  Patient was on Xanax as outpatient.  Psychiatry managing.    Conjuctival injection -bilaterally.  Stuffy nose.  Could be secondary to dry eyes and not blinking enough.  Continue Cipro eyedrops teardrops Claritin and Flonase.  COPD with acute exacerbation  Improved at this time.  Continue bronchodilators Singulair.   Nutrition Status: There is no height or weight on file to calculate BMI.  Nutrition Problem: Moderate Malnutrition Etiology: social / environmental circumstances Signs/Symptoms: mild muscle depletion, mild fat depletion, moderate muscle depletion Interventions: Ensure Enlive (each supplement provides 350kcal and 20 grams of protein)      DVT prophylaxis: enoxaparin (LOVENOX) injection 40 mg Start: 06/03/23 0800 SCDs Start: 06/02/23 2138   Code Status:     Code Status: Full Code  Disposition: Likely home in 1 to 2 days when okay with psychiatry.  Might need PT evaluation.  Status is: Inpatient  Remains inpatient appropriate because: Altered mental status,  possible benzodiazepine intoxication,  Family Communication: None at bedside  Consultants:  Neurology Psychiatry  Procedures:  Continuous EEG  Antimicrobials:  None currently  Anti-infectives (From admission, onward)    None      Subjective: Today, patient was seen and examined at  bedside.  Denies nausea vomiting fever chills or rigor.  Feels better with his eyes.  Complains of rash over his groin area.  Objective: Vitals:   06/03/23 2327 06/04/23 0351 06/04/23 0741 06/04/23 1212  BP: (!) 164/83 (!) 149/97 (!) 150/86 (!) 142/92  Pulse: 60 (!) 59 (!) 58 62  Resp: 19 17 18 18   Temp: (!) 97.4 F (36.3 C) 98.5 F (36.9 C) 99.1 F (37.3 C) 98.7 F (37.1 C)  TempSrc: Oral Oral Oral Oral  SpO2: 100% 100% 100% 100%    Intake/Output Summary (Last 24 hours) at 06/04/2023 1549 Last data filed at 06/04/2023 0900 Gross per 24 hour  Intake 320 ml  Output 3400 ml  Net -3080 ml   There were no vitals filed for this visit.  Physical Examination: There is no height or weight on file to calculate BMI.   General:  Average built, not in obvious distress, on room air. HENT:   No scleral pallor or icterus noted.  Bilateral conjunctival injection, improved, Chest:  Clear breath sounds. No crackles or wheezes.  CVS: S1 &S2 heard. No murmur.  Regular rate and rhythm.  Groin area with candidiasis. Abdomen: Soft, nontender, nondistended.  Bowel sounds are heard.   Extremities: No cyanosis, clubbing or edema.  Peripheral pulses are palpable. Psych: Alert, awake and Communicative, CNS:  No cranial nerve deficits.  Moves all extremities Skin: Warm and dry.  No rashes noted.  Data Reviewed:   CBC: Recent Labs  Lab 05/30/23 0319 05/31/23 0349 06/01/23 0412 06/03/23 0530 06/04/23 0308  WBC 21.5* 12.5* 13.2* 15.9* 13.7*  NEUTROABS  --   --   --  12.6*  --   HGB 9.9* 8.4* 9.4* 7.9* 8.3*  HCT 26.7* 22.8* 26.7* 23.7* 24.5*  MCV 81.4 82.9 86.1 90.5 92.1  PLT 714* 570* 666* 507* 457*    Basic Metabolic Panel: Recent Labs  Lab 05/30/23 0319 05/30/23 1017 05/31/23 0349 05/31/23 1259 05/31/23 1632 06/01/23 0412 06/02/23 0405 06/03/23 0530 06/04/23 0308  NA 128*   < > 131*  --  136 139 135 136 133*  K <2.0*   < > 2.8*   < > 3.7 3.1* 3.9 4.1 3.2*  CL 89*   < > 97*  --   101 102 100 99 99  CO2 23   < > 26  --  24 27 25 26 26   GLUCOSE 113*   < > 116*  --  105* 116* 105* 94 96  BUN 28*   < > 19  --  18 21 17 19 20   CREATININE 1.80*   < > 1.28*  --  1.32* 1.24 1.13 1.23 1.16  CALCIUM 8.1*   < > 7.7*  --  8.9 8.7* 8.5* 8.5* 8.3*  MG 1.6*   < > 2.3  --  2.2 2.2 1.8  --  1.7  PHOS <1.0*  --  3.3  --  2.6 3.0 3.3  --   --    < > = values in this interval not displayed.    Liver Function Tests: Recent Labs  Lab 05/29/23 1618 05/30/23 0319 05/31/23 0349 06/01/23 0412  AST 48* 41 33 45*  ALT 73* 61* 46* 58*  ALKPHOS 101 86 72  70  BILITOT 0.7 1.1 0.6 0.9  PROT 7.3 6.8 5.6* 6.4*  ALBUMIN 3.5 3.3* 2.8* 3.2*     Radiology Studies: Overnight EEG with video  Result Date: 06/03/2023 Charlsie Quest, MD     06/04/2023  8:03 AM Patient Name: Jaydyn Schwartzenberge Krauss MRN: [id number] Epilepsy Attending: Charlsie Quest Referring Physician/Provider: Erick Blinks, MD Duration: 06/03/2023 0147 to 06/04/2023 0147 Patient history: 67yo M with seizure like activity getting eeg to evaluate for seizure Level of alertness: Awake, asleep AEDs during EEG study: Xanax Technical aspects: This EEG study was done with scalp electrodes positioned according to the 10-20 International system of electrode placement. Electrical activity was reviewed with band pass filter of 1-70Hz , sensitivity of 7 uV/mm, display speed of 21mm/sec with a 60Hz  notched filter applied as appropriate. EEG data were recorded continuously and digitally stored.  Video monitoring was available and reviewed as appropriate. Description: The posterior dominant rhythm consists of 9 Hz activity of moderate voltage (25-35 uV) seen predominantly in posterior head regions, symmetric and reactive to eye opening and eye closing. Sleep was characterized by vertex waves, sleep spindles (12 to 14 Hz), maximal frontocentral region. There is 15 to 18 Hz beta activity with irregular morphology distributed symmetrically and diffusely.  Hyperventilation and photic stimulation were not performed.   IMPRESSION: This study is within normal limits. No seizures or epileptiform discharges were seen throughout the recording. A normal interictal EEG does not exclude the diagnosis of epilepsy. Priyanka [name]      LOS: 2 days    Joycelyn Das, MD [facility] Available via Epic secure chat 7am-7pm After these hours, please refer to coverage provider listed on [email or web address] 06/04/2023, 3:49 PM

## 2023-06-04 NOTE — Progress Notes (Signed)
LTM EEG disconnected - no skin breakdown at unhook.  
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
## 2023-06-04 NOTE — Plan of Care (Signed)

## 2023-06-05 DIAGNOSIS — N390 Urinary tract infection, site not specified: Secondary | ICD-10-CM | POA: Diagnosis not present

## 2023-06-05 DIAGNOSIS — F419 Anxiety disorder, unspecified: Secondary | ICD-10-CM | POA: Diagnosis not present

## 2023-06-05 DIAGNOSIS — R4182 Altered mental status, unspecified: Secondary | ICD-10-CM | POA: Diagnosis not present

## 2023-06-05 DIAGNOSIS — E440 Moderate protein-calorie malnutrition: Secondary | ICD-10-CM | POA: Diagnosis not present

## 2023-06-05 DIAGNOSIS — A419 Sepsis, unspecified organism: Secondary | ICD-10-CM | POA: Diagnosis not present

## 2023-06-05 LAB — GLUCOSE, CAPILLARY
Glucose-Capillary: 102 mg/dL — ABNORMAL HIGH (ref 70–99)
Glucose-Capillary: 116 mg/dL — ABNORMAL HIGH (ref 70–99)
Glucose-Capillary: 133 mg/dL — ABNORMAL HIGH (ref 70–99)
Glucose-Capillary: 86 mg/dL (ref 70–99)

## 2023-06-05 MED ORDER — DIAZEPAM 5 MG PO TABS
15.0000 mg | ORAL_TABLET | Freq: Three times a day (TID) | ORAL | Status: DC
  Administered 2023-06-05 – 2023-06-14 (×28): 15 mg via ORAL
  Filled 2023-06-05 (×28): qty 3

## 2023-06-05 NOTE — Plan of Care (Signed)
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
## 2023-06-05 NOTE — Plan of Care (Signed)
  Problem: Education: Goal: Knowledge of General Education information will improve Description: Including pain rating scale, medication(s)/side effects and non-pharmacologic comfort measures Outcome: Progressing   Problem: Health Behavior/Discharge Planning: Goal: Ability to manage health-related needs will improve Outcome: Progressing   Problem: Clinical Measurements: Goal: Ability to maintain clinical measurements within normal limits will improve Outcome: Progressing Goal: Will remain free from infection Outcome: Progressing Goal: Diagnostic test results will improve Outcome: Progressing Goal: Respiratory complications will improve Outcome: Progressing Goal: Cardiovascular complication will be avoided Outcome: Progressing   Problem: Activity: Goal: Risk for activity intolerance will decrease Outcome: Progressing   Problem: Nutrition: Goal: Adequate nutrition will be maintained Outcome: Progressing   Problem: Coping: Goal: Level of anxiety will decrease Outcome: Progressing   Problem: Elimination: Goal: Will not experience complications related to bowel motility Outcome: Progressing Goal: Will not experience complications related to urinary retention Outcome: Progressing   Problem: Pain Managment: Goal: General experience of comfort will improve Outcome: Progressing   Problem: Safety: Goal: Ability to remain free from injury will improve Outcome: Progressing   Problem: Skin Integrity: Goal: Risk for impaired skin integrity will decrease Outcome: Progressing   Problem: Nutritional: Goal: Maintenance of adequate nutrition will improve Outcome: Progressing Goal: Progress toward achieving an optimal weight will improve Outcome: Progressing   Problem: Skin Integrity: Goal: Risk for impaired skin integrity will decrease Outcome: Progressing

## 2023-06-05 NOTE — Plan of Care (Signed)
  Problem: Education: Goal: Knowledge of General Education information will improve Description: Including pain rating scale, medication(s)/side effects and non-pharmacologic comfort measures Outcome: Progressing   Problem: Activity: Goal: Risk for activity intolerance will decrease Outcome: Progressing   Problem: Nutrition: Goal: Adequate nutrition will be maintained Outcome: Progressing   Problem: Coping: Goal: Level of anxiety will decrease Outcome: Progressing   

## 2023-06-05 NOTE — Progress Notes (Signed)
PROGRESS NOTE    Clarence Hamilton  ZOX:[id number] DOB: 1956/06/22 DOA: 06/02/2023 PCP: Malva Limes, MD    Brief Narrative:   Clarence Hamilton is a 67 y.o. male with medical history significant of mild persistent Asthma/COPD On oxygen at 3 liters, intolerant to steroids, HTN, HLD, Prediabetes, Lumbar radiculopathy, chronic pain syndrome,anxiety and depression Hospital initially to [facility] on May 29, 2023 with altered mental status when he was noted to be lethargic and had 1 word answers.  Initial labs showed hyponatremia with sodium of 125 with potassium low at 2.0 with leukocytosis at 19.6.  Had platelet count of 725 and urine drug screen was positive for opiates benzos and cannabis.  Patient was given broad-spectrum antibiotic and was admitted hospital for possible sepsis secondary to UTI but despite antibiotic treatment patient continued to have altered mental status and had an episodic seizure-like activity.  MRI of the brain was negative but then due to recurrent seizure-like activity patient was transferred to [facility].    Assessment and Plan:  Sepsis secondary to E coli UTI  Diagnosed on initial admission at [facility].  Status post vancomycin cefepime Flagyl.  CT scan of the chest abdomen pelvis was nonacute.  Thrombocytosis improving.  Review of urine cultures showed  pansensitive for E. coli.  Patient has completed course of antibiotic.   Temperature max of 99.27F.  Still has mild leukocytosis at 13.7 from 15.9.  CBC from today pending.  Lyme disease serology has been sent and in process..  Anemia.  Latest Hemoglobin of 8.3 from 7.9.  No reported blood loss.  Will continue to monitor.  Mild hypokalemia.  Potassium from yesterday 3.2.  Was replenished.  Check levels in AM.   Altered mental status, concern for benzodiazepine intoxication. Psychiatry with an impression of possible benzodiazepine intoxication versus unspecified dementia.  Unclear timeline but had  symptoms for almost a week.  Had seizure-like activity so MRI brain was obtained which was  negative for acute findings..   Patient was on gabapentin Flexeril at home which is currently on hold.  Psychiatry on board and is on gradual benzodiazepine taper.  Patient underwent a long-term EEG which was negative for seizures.  TSH level at 0.3.  ABG done 06/03/2023 with normal pH.    Currently on Valium as per psychiatry. Follow-up neurology recommendations.  Groin rash.  Likely candidal intertrigo.  Continue nystatin powder.Marland Kitchen  Anxiety disorder Psychiatry on board and on gradual benzodiazepine taper.  Patient was on Xanax as outpatient.  Psychiatry following the patient during hospitalization.  Plan is to continue Valium 15 mg 3 times daily and Xanax has been discontinued 8/10.    Conjuctival injection -bilaterally.  Stuffy nose.  Could be secondary to dry eyes and not blinking enough.  Continue Cipro eyedrops teardrops Claritin and Flonase.  Improving symptoms.  COPD with acute exacerbation  Improved at this time.  Continue bronchodilators Singulair.   Nutrition Status: There is no height or weight on file to calculate BMI.  Nutrition Problem: Moderate Malnutrition Etiology: social / environmental circumstances Signs/Symptoms: mild muscle depletion, mild fat depletion, moderate muscle depletion Interventions: Ensure Enlive (each supplement provides 350kcal and 20 grams of protein)      DVT prophylaxis: enoxaparin (LOVENOX) injection 40 mg Start: 06/03/23 0800 SCDs Start: 06/02/23 2138   Code Status:     Code Status: Full Code  Disposition:  Likely home in 1 to 2 days when okay with psychiatry.  Might need PT evaluation.  Status is:  Inpatient  Remains inpatient appropriate because: Altered mental status,  possible benzodiazepine intoxication,   Family Communication: None at bedside  Consultants:  Neurology Psychiatry  Procedures:  Continuous EEG  Antimicrobials:  None  currently  Anti-infectives (From admission, onward)    None      Subjective: Today, patient was seen and examined at bedside.  Patient states that his redness has decreased.  Still has some sniffles.  Objective: Vitals:   06/04/23 2337 06/05/23 0359 06/05/23 0700 06/05/23 1147  BP: (!) 164/81 (!) 149/72 (!) 153/79 (!) 158/88  Pulse: 72 70 69 69  Resp: 17 18 18 18   Temp: 98.5 F (36.9 C) 99.1 F (37.3 C) 98.7 F (37.1 C) 98.4 F (36.9 C)  TempSrc: Oral Axillary Axillary Oral  SpO2: 100% 100% 100% 100%    Intake/Output Summary (Last 24 hours) at 06/05/2023 1357 Last data filed at 06/05/2023 0600 Gross per 24 hour  Intake --  Output 3700 ml  Net -3700 ml   There were no vitals filed for this visit.  Physical Examination: There is no height or weight on file to calculate BMI.   General:  Average built, not in obvious distress, on room air. HENT:   No scleral pallor or icterus noted.  Bilateral conjunctival injection,  Chest:  Clear breath sounds. No crackles or wheezes.  CVS: S1 &S2 heard. No murmur.  Regular rate and rhythm.  Groin area with candidiasis. Abdomen: Soft, nontender, nondistended.  Bowel sounds are heard.   Extremities: No cyanosis, clubbing or edema.  Peripheral pulses are palpable. Psych: Alert, awake Communicative. CNS:  No cranial nerve deficits.  Moves all extremities Skin: Warm and dry.  No rashes noted.  Data Reviewed:   CBC: Recent Labs  Lab 05/30/23 0319 05/31/23 0349 06/01/23 0412 06/03/23 0530 06/04/23 0308  WBC 21.5* 12.5* 13.2* 15.9* 13.7*  NEUTROABS  --   --   --  12.6*  --   HGB 9.9* 8.4* 9.4* 7.9* 8.3*  HCT 26.7* 22.8* 26.7* 23.7* 24.5*  MCV 81.4 82.9 86.1 90.5 92.1  PLT 714* 570* 666* 507* 457*    Basic Metabolic Panel: Recent Labs  Lab 05/30/23 0319 05/30/23 1017 05/31/23 0349 05/31/23 1259 05/31/23 1632 06/01/23 0412 06/02/23 0405 06/03/23 0530 06/04/23 0308  NA 128*   < > 131*  --  136 139 135 136 133*  K <2.0*    < > 2.8*   < > 3.7 3.1* 3.9 4.1 3.2*  CL 89*   < > 97*  --  101 102 100 99 99  CO2 23   < > 26  --  24 27 25 26 26   GLUCOSE 113*   < > 116*  --  105* 116* 105* 94 96  BUN 28*   < > 19  --  18 21 17 19 20   CREATININE 1.80*   < > 1.28*  --  1.32* 1.24 1.13 1.23 1.16  CALCIUM 8.1*   < > 7.7*  --  8.9 8.7* 8.5* 8.5* 8.3*  MG 1.6*   < > 2.3  --  2.2 2.2 1.8  --  1.7  PHOS <1.0*  --  3.3  --  2.6 3.0 3.3  --   --    < > = values in this interval not displayed.    Liver Function Tests: Recent Labs  Lab 05/29/23 1618 05/30/23 0319 05/31/23 0349 06/01/23 0412  AST 48* 41 33 45*  ALT 73* 61* 46* 58*  ALKPHOS 101 86  72 70  BILITOT 0.7 1.1 0.6 0.9  PROT 7.3 6.8 5.6* 6.4*  ALBUMIN 3.5 3.3* 2.8* 3.2*     Radiology Studies: No results found.    LOS: 3 days    Joycelyn Das, MD [facility] Available via Epic secure chat 7am-7pm After these hours, please refer to coverage provider listed on [email or web address] 06/05/2023, 1:57 PM

## 2023-06-06 DIAGNOSIS — A419 Sepsis, unspecified organism: Secondary | ICD-10-CM | POA: Diagnosis not present

## 2023-06-06 DIAGNOSIS — E440 Moderate protein-calorie malnutrition: Secondary | ICD-10-CM | POA: Diagnosis not present

## 2023-06-06 DIAGNOSIS — R410 Disorientation, unspecified: Secondary | ICD-10-CM | POA: Diagnosis not present

## 2023-06-06 DIAGNOSIS — F419 Anxiety disorder, unspecified: Secondary | ICD-10-CM | POA: Diagnosis not present

## 2023-06-06 LAB — GLUCOSE, CAPILLARY
Glucose-Capillary: 95 mg/dL (ref 70–99)
Glucose-Capillary: 91 mg/dL (ref 70–99)
Glucose-Capillary: 70 mg/dL (ref 70–99)
Glucose-Capillary: 177 mg/dL — ABNORMAL HIGH (ref 70–99)

## 2023-06-06 MED ORDER — MAGNESIUM SULFATE 2 GM/50ML IV SOLN
2.0000 g | Freq: Once | INTRAVENOUS | Status: AC
  Administered 2023-06-06: 2 g via INTRAVENOUS
  Filled 2023-06-06: qty 50

## 2023-06-06 MED ORDER — MAGNESIUM OXIDE -MG SUPPLEMENT 400 (240 MG) MG PO TABS
400.0000 mg | ORAL_TABLET | Freq: Two times a day (BID) | ORAL | Status: DC
  Administered 2023-06-06 – 2023-06-14 (×17): 400 mg via ORAL
  Filled 2023-06-06 (×18): qty 1

## 2023-06-06 MED ORDER — POTASSIUM CHLORIDE CRYS ER 20 MEQ PO TBCR
40.0000 meq | EXTENDED_RELEASE_TABLET | ORAL | Status: AC
  Administered 2023-06-06 (×2): 40 meq via ORAL
  Filled 2023-06-06 (×2): qty 2

## 2023-06-06 NOTE — Evaluation (Signed)
Occupational Therapy Evaluation Patient Details Name: Clarence Hamilton MRN: [id number] DOB: November 13, 1955 Today's Date: 06/06/2023   History of Present Illness Pt is a 67 yo male that presented to [facility] on 8/4 for AMS. Workup for acute toxic metabolic encephalopathy, sepsis secondary to UTI, seizure like activity, AKI. Urine drug screen positive for opiates benzos and cannabis. Pt transferred to [facility] for continuous EEG on 8/8. PMH of asthma/COPD, HTN, prediabetes, HLD, chronic pain syndrome, anxiety, depression.   Clinical Impression   Ronaldo Miyamoto was evaluated s/p the above admission list. Pt is a questionable historian however he reports he is mod I and lives alone at baseline. Upon evaluation the pt was limited by impaired cognition with limited insight to situation, deficits and safety, generalized weakness and poor activity tolerance. Overall he needed up to min A for simple transfers and was limited to room mobility with RW due to fatigue and report of not getting OOB since being in the hospital. Due to the deficits listed below the pt also needs min-max A for LB ADLs and set up A for UB ADLs. Pt will benefit from continued acute OT services and discharge to skilled inpatient follow up therapy, <3 hours/day due to lack of caregiver support and safety.        If plan is discharge home, recommend the following: A little help with walking and/or transfers;A little help with bathing/dressing/bathroom;Assistance with cooking/housework;Direct supervision/assist for financial management;Direct supervision/assist for medications management;Assist for transportation;Help with stairs or ramp for entrance;Supervision due to cognitive status    Functional Status Assessment  Patient has had a recent decline in their functional status and demonstrates the ability to make significant improvements in function in a reasonable and predictable amount of time.  Equipment Recommendations  None recommended by OT        Precautions / Restrictions Precautions Precautions: Fall Restrictions Weight Bearing Restrictions: No      Mobility Bed Mobility               General bed mobility comments: OOB upon arrival    Transfers Overall transfer level: Needs assistance Equipment used: Rolling walker (2 wheels) Transfers: Sit to/from Stand, Bed to chair/wheelchair/BSC Sit to Stand: Min assist           General transfer comment: CGA from elevated surfaces, min A from lower surfaces      Balance Overall balance assessment: Needs assistance Sitting-balance support: No upper extremity supported, Feet supported Sitting balance-Leahy Scale: Good     Standing balance support: Bilateral upper extremity supported, During functional activity, Reliant on assistive device for balance Standing balance-Leahy Scale: Poor                             ADL either performed or assessed with clinical judgement   ADL Overall ADL's : Needs assistance/impaired Eating/Feeding: Independent   Grooming: Supervision/safety;Bed level   Upper Body Bathing: Set up;Sitting   Lower Body Bathing: Contact guard assist;Sit to/from stand   Upper Body Dressing : Set up;Sitting   Lower Body Dressing: Minimal assistance;Sit to/from stand   Toilet Transfer: Ambulation;Rolling walker (2 wheels);Minimal assistance Toilet Transfer Details (indicate cue type and reason): min A from lower surface Toileting- Clothing Manipulation and Hygiene: Maximal assistance;Sit to/from stand       Functional mobility during ADLs: Minimal assistance;Rolling walker (2 wheels) General ADL Comments: needs assist to stand from lower surfaces, cues for attention. poor acitivty tolerance     Vision Baseline  Vision/History: 1 Wears glasses Ability to See in Adequate Light: 1 Impaired Patient Visual Report: Blurring of vision Vision Assessment?: Vision impaired- to be further tested in functional context Additional Comments:  pt reports new blurry vision of R eye, eye is red. Reports he is using antibiotic drops. vision is [facility] for ADLs     Perception Perception: Not tested       Praxis Praxis: Not tested       Pertinent Vitals/Pain Pain Assessment Pain Assessment: No/denies pain     Extremity/Trunk Assessment Upper Extremity Assessment Upper Extremity Assessment: Generalized weakness   Lower Extremity Assessment Lower Extremity Assessment: Defer to PT evaluation   Cervical / Trunk Assessment Cervical / Trunk Assessment: Normal   Communication Communication Communication: No apparent difficulties   Cognition Arousal: Alert Behavior During Therapy: WFL for tasks assessed/performed Overall Cognitive Status: No family/caregiver present to determine baseline cognitive functioning Area of Impairment: Orientation, Safety/judgement, Awareness                 Orientation Level: Disoriented to, Time, Situation       Safety/Judgement: Decreased awareness of deficits, Decreased awareness of safety Awareness: Intellectual Problem Solving: Slow processing, Decreased initiation, Requires verbal cues, Requires tactile cues, Difficulty sequencing General Comments: pt is a questionable historian, report is inaccurate of information in the cart, he seems to confabulate. no family present     General Comments  VSS on RA, pt denies O2 use at home            Home Living Family/patient expects to be discharged to:: Private residence Living Arrangements: Alone Available Help at Discharge: Family;Available PRN/intermittently Type of Home: House Home Access: Ramped entrance     Home Layout: One level     Bathroom Shower/Tub: Producer, television/film/video: Standard     Home Equipment: Shower seat - built in          Prior Functioning/Environment Prior Level of Function : Independent/Modified Independent                        OT Problem List: Decreased activity  tolerance;Impaired balance (sitting and/or standing);Decreased safety awareness;Decreased knowledge of use of DME or AE;Decreased knowledge of precautions      OT Treatment/Interventions: Self-care/ADL training;Energy conservation;Balance training;Therapeutic exercise;DME and/or AE instruction;Manual therapy;Therapeutic activities;Patient/family education    OT Goals(Current goals can be found in the care plan section) Acute Rehab OT Goals Patient Stated Goal: to go to rehab OT Goal Formulation: Patient unable to participate in goal setting Time For Goal Achievement: 06/20/23 Potential to Achieve Goals: Good ADL Goals Additional ADL Goal #1: Pt will complete all BADLs with mod I Additional ADL Goal #2: Pt will indep complete IADL medication management task  OT Frequency: Min 1X/week       AM-PAC OT "6 Clicks" Daily Activity     Outcome Measure Help from another person eating meals?: None Help from another person taking care of personal grooming?: A Little Help from another person toileting, which includes using toliet, bedpan, or urinal?: A Little Help from another person bathing (including washing, rinsing, drying)?: A Little Help from another person to put on and taking off regular upper body clothing?: A Little Help from another person to put on and taking off regular lower body clothing?: A Little 6 Click Score: 19   End of Session Equipment Utilized During Treatment: Gait belt;Rolling walker (2 wheels) Nurse Communication: Mobility status  Activity Tolerance: Patient tolerated treatment  well Patient left: in chair;with call bell/phone within reach  OT Visit Diagnosis: Other abnormalities of gait and mobility (R26.89);Unsteadiness on feet (R26.81);Muscle weakness (generalized) (M62.81);Cognitive communication deficit (R41.841)                Time: 1133-1150 OT Time Calculation (min): 17 min Charges:  OT General Charges $OT Visit: 1 Visit OT Evaluation $OT Eval Moderate  Complexity: 1 Mod  Derenda Mis, OTR/L Acute Rehabilitation Services Office [phone] Secure Chat Communication Preferred   Donia Pounds 06/06/2023, 12:11 PM

## 2023-06-06 NOTE — Progress Notes (Addendum)
PROGRESS NOTE    Tavi Ulman Hearty  ZOX:[id number] DOB: 1956/03/19 DOA: 06/02/2023 PCP: Malva Limes, MD    Brief Narrative:   Zayvin Ebarb Caponigro is a 67 y.o. male with medical history significant of mild persistent Asthma/COPD On oxygen at 3 liters, intolerant to steroids, HTN, HLD, Prediabetes, Lumbar radiculopathy, chronic pain syndrome,anxiety and depression Hospital initially to [facility] on May 29, 2023 with altered mental status when he was noted to be lethargic and had 1 word answers.  Initial labs showed hyponatremia with sodium of 125 with potassium low at 2.0 with leukocytosis at 19.6.  Had platelet count of 725 and urine drug screen was positive for opiates benzos and cannabis.  Patient was given broad-spectrum antibiotic and was admitted hospital for possible sepsis secondary to UTI but despite antibiotic treatment patient continued to have altered mental status and had an episodic seizure-like activity.  MRI of the brain was negative but then due to recurrent seizure-like activity patient was transferred to [facility].   During hospitalization, patient has been followed by psychiatry and there is concern for benzodiazepine intoxication.  Xanax has been discontinued and transitioned on long-acting benzodiazepines.  Assessment and Plan:  Sepsis secondary to E coli UTI  Diagnosed on initial admission at [facility].    CT scan of the chest abdomen pelvis was nonacute. Review of urine cultures showed  pansensitive for E. coli.  Patient has completed course of antibiotic.   Temperature max of 99.12F.  Leukocytosis has resolved.  Lyme disease serology has been sent and in process..  Anemia.  Latest Hemoglobin of 8.5 from 7.9.  No reported blood loss.  Will continue to monitor.  Mild hypokalemia.  Potassium 3.4 today.  Will continue to replenish orally, 40 mEq x 2..  Check levels in AM.  Mild hypomagnesemia.  Magnesium level 1.6 today.  Will replenish through IV and  orally.  Altered mental status,  Improved at this time.    Concern for benzodiazepine intoxication. Psychiatry with an impression of possible benzodiazepine intoxication versus unspecified dementia. Had seizure-like activity so MRI brain was obtained which was  negative for acute findings..   Patient was on gabapentin Flexeril at home which is currently on hold.  Psychiatry on board and is on Valium with Xanax discontinued.  Long-term EEG which was negative for seizures.  Neurology also followed the patient during hospitalization.  TSH level at 0.3.  Follow-up psychiatry recommendations.  Groin rash.  Likely candidal intertrigo.  Continue nystatin powder.Marland Kitchen  Anxiety disorder Psychiatry on board and on  Valium 15 mg 3 times daily and Xanax has been discontinued 8/10.    Conjuctival injection -bilaterally.  Stuffy nose.  On Cipro eyedrops teardrops Claritin and Flonase.  Still has right eye redness.  COPD with acute exacerbation  Compensated continue bronchodilators Singulair.   Nutrition Status: There is no height or weight on file to calculate BMI.  There is no height or weight on file to calculate BMI.  Nutrition Problem: Moderate Malnutrition Etiology: social / environmental circumstances Signs/Symptoms: mild muscle depletion, mild fat depletion, moderate muscle depletion Interventions: Ensure Enlive (each supplement provides 350kcal and 20 grams of protein)      DVT prophylaxis: enoxaparin (LOVENOX) injection 40 mg Start: 06/03/23 0800 SCDs Start: 06/02/23 2138   Code Status:     Code Status: Full Code  Disposition:  Will get PT evaluation.  Uncertain at this time.  Status is: Inpatient  Remains inpatient appropriate because: Altered mental status,  benzodiazepine intoxication,  Family Communication: None at bedside  Consultants:  Neurology Psychiatry  Procedures:  Continuous EEG  Antimicrobials:  None   Anti-infectives (From admission, onward)    None       Subjective: Today, patient was seen and examined at bedside.  Feels okay otherwise except for some redness on the right thigh with some blurry vision.  Denies any headache, nausea, vomiting.  States that he did sleep well.   Objective: Vitals:   06/05/23 2044 06/05/23 2358 06/06/23 0339 06/06/23 0804  BP: (!) 160/88 (!) 141/70 (!) 120/91 (!) 154/82  Pulse: 70 66 77 68  Resp: 18   17  Temp: 99.5 F (37.5 C) 99 F (37.2 C) 99.2 F (37.3 C) 98.7 F (37.1 C)  TempSrc: Oral Oral Oral Oral  SpO2: 100% 96% 100% 98%    Intake/Output Summary (Last 24 hours) at 06/06/2023 1117 Last data filed at 06/06/2023 1000 Gross per 24 hour  Intake 5062.73 ml  Output 4050 ml  Net 1012.73 ml   There were no vitals filed for this visit.  Physical Examination: There is no height or weight on file to calculate BMI.   General:  Average built, not in obvious distress, on room air. HENT:   No scleral pallor or icterus noted.  Right eye congested more than left. Chest:  Clear breath sounds. No crackles or wheezes.  CVS: S1 &S2 heard. No murmur.  Regular rate and rhythm.  Groin area with candidiasis. Abdomen: Soft, nontender, nondistended.  Bowel sounds are heard.   Extremities: No cyanosis, clubbing or edema.  Peripheral pulses are palpable. Psych: Alert, awake Communicative. CNS:  No cranial nerve deficits.  Moves all extremities Skin: Warm and dry.  No rashes noted.  Data Reviewed:   CBC: Recent Labs  Lab 05/31/23 0349 06/01/23 0412 06/03/23 0530 06/04/23 0308 06/06/23 0159  WBC 12.5* 13.2* 15.9* 13.7* 8.7  NEUTROABS  --   --  12.6*  --   --   HGB 8.4* 9.4* 7.9* 8.3* 8.5*  HCT 22.8* 26.7* 23.7* 24.5* 25.0*  MCV 82.9 86.1 90.5 92.1 89.9  PLT 570* 666* 507* 457* 383    Basic Metabolic Panel: Recent Labs  Lab 05/31/23 0349 05/31/23 1259 05/31/23 1632 06/01/23 0412 06/02/23 0405 06/03/23 0530 06/04/23 0308 06/06/23 0159  NA 131*  --  136 139 135 136 133* 137  K 2.8*   < > 3.7  3.1* 3.9 4.1 3.2* 3.4*  CL 97*  --  101 102 100 99 99 101  CO2 26  --  24 27 25 26 26 27   GLUCOSE 116*  --  105* 116* 105* 94 96 114*  BUN 19  --  18 21 17 19 20 21   CREATININE 1.28*  --  1.32* 1.24 1.13 1.23 1.16 1.68*  CALCIUM 7.7*  --  8.9 8.7* 8.5* 8.5* 8.3* 8.3*  MG 2.3  --  2.2 2.2 1.8  --  1.7 1.6*  PHOS 3.3  --  2.6 3.0 3.3  --   --   --    < > = values in this interval not displayed.    Liver Function Tests: Recent Labs  Lab 05/31/23 0349 06/01/23 0412  AST 33 45*  ALT 46* 58*  ALKPHOS 72 70  BILITOT 0.6 0.9  PROT 5.6* 6.4*  ALBUMIN 2.8* 3.2*     Radiology Studies: No results found.    LOS: 4 days    Joycelyn Das, MD [facility] Available via Epic secure chat 7am-7pm After  these hours, please refer to coverage provider listed on [email or web address] 06/06/2023, 11:17 AM
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
## 2023-06-06 NOTE — Progress Notes (Signed)
Name: Clarence Hamilton DOB: 1955-12-05  Please be advised that the above-named patient will require a short-term nursing home stay -- anticipated 30 days or less for rehabilitation and strengthening. The plan is for return home.

## 2023-06-06 NOTE — [place] FL2 (Signed)
[place] MEDICAID FL2 LEVEL OF CARE FORM     IDENTIFICATION  Patient Name: Clarence Hamilton Birthdate: [birth year] Sex: male Admission Date (Current Location): 06/02/2023  [facility] and [place] Number:  Chiropodist and Address:  [facility] [facility]. [facility], [address], [place], [place] [address]      Provider Number: [id number]  Attending Physician Name and Address:  Joycelyn Das, MD  Relative Name and Phone Number:       Current Level of Care: Hospital Recommended Level of Care: Skilled Nursing Facility Prior Approval Number:    Date Approved/Denied:   PASRR Number: Manual review  Discharge Plan: SNF    Current Diagnoses: Patient Active Problem List   Diagnosis Date Noted   Malnutrition of moderate degree 06/04/2023   Toxic metabolic encephalopathy 06/02/2023   Sepsis secondary to UTI (HCC) 06/02/2023   AMS (altered mental status) 06/02/2023   Altered mental status 05/29/2023   Chronic kidney disease, stage 3 unspecified (HCC) 02/27/2022   Lumbar radiculopathy 10/31/2021   Chronic respiratory failure with hypoxia (HCC) 02/17/2021   ILD (interstitial lung disease) (HCC) 12/25/2019   Osteoarthritis of left knee 05/16/2019   Osteoarthritis of right knee 05/16/2019   Pain in right knee 08/18/2018   Lumbar spondylosis 07/20/2018   Degeneration of lumbar intervertebral disc 03/23/2018   Aortic atherosclerosis (HCC) 10/14/2017   Elevated PSA 04/01/2016   Morbid obesity, unspecified obesity type (HCC) 02/16/2016   Dermatitis, stasis 02/06/2015   Bilateral leg edema 02/06/2015   Chronic venous stasis dermatitis of both lower extremities 02/06/2015   Prediabetes 04/30/2014   Hyperlipidemia, mixed 09/05/2008   Hypertension 02/13/2008   Anxiety disorder 10/25/1998   Asthma, intrinsic 10/25/1968    Orientation RESPIRATION BLADDER Height & Weight     Self, Time, Situation, Place  Normal Incontinent Weight:   Height:     BEHAVIORAL  SYMPTOMS/MOOD NEUROLOGICAL BOWEL NUTRITION STATUS      Continent Diet (regular)  AMBULATORY STATUS COMMUNICATION OF NEEDS Skin   Limited Assist Verbally Other (Comment) (rash on groin)                       Personal Care Assistance Level of Assistance  Bathing, Feeding, Dressing Bathing Assistance: Limited assistance Feeding assistance: Limited assistance Dressing Assistance: Limited assistance     Functional Limitations Info  Sight Sight Info: Impaired        SPECIAL CARE FACTORS FREQUENCY  PT (By licensed PT), OT (By licensed OT)     PT Frequency: 5x/wk OT Frequency: 5x/wk            Contractures Contractures Info: Not present    Additional Factors Info  Code Status, Allergies, Insulin Sliding Scale Code Status Info: Full Allergies Info: Sulfa antibiotics   Insulin Sliding Scale Info: see DC summary       Current Medications (06/06/2023):  This is the current hospital active medication list Current Facility-Administered Medications  Medication Dose Route Frequency Provider Last Rate Last Admin   0.9 %  sodium chloride infusion   Intravenous Continuous Nolberto Hanlon, MD 75 mL/hr at 06/06/23 0720 New Bag at 06/06/23 0720   acetaminophen (TYLENOL) tablet 650 mg  650 mg Oral Q6H PRN Nolberto Hanlon, MD       Or   acetaminophen (TYLENOL) suppository 650 mg  650 mg Rectal Q6H PRN Nolberto Hanlon, MD       albuterol (PROVENTIL) (2.5 MG/3ML) 0.083% nebulizer solution 2.5 mg  2.5 mg Nebulization Q4H  PRN Nolberto Hanlon, MD       ciprofloxacin (CILOXAN) 0.3 % ophthalmic solution 1 drop  1 drop Both Eyes Q4H while awake Pokhrel, Laxman, MD   1 drop at 06/06/23 1315   diazepam (VALIUM) tablet 15 mg  15 mg Oral TID Neville Route L, DO   15 mg at 06/06/23 1030   diltiazem (CARDIZEM CD) 24 hr capsule 120 mg  120 mg Oral Daily Nolberto Hanlon, MD   120 mg at 06/06/23 1028   enoxaparin (LOVENOX) injection 40 mg  40 mg Subcutaneous Q24H Nolberto Hanlon, MD   40 mg at 06/06/23 0900   feeding  supplement (ENSURE ENLIVE / ENSURE PLUS) liquid 237 mL  237 mL Oral TID BM Pokhrel, Laxman, MD   237 mL at 06/06/23 1311   fluticasone (FLONASE) 50 MCG/ACT nasal spray 1 spray  1 spray Each Nare Daily Pokhrel, Laxman, MD   1 spray at 06/06/23 1031   hydrALAZINE (APRESOLINE) injection 10 mg  10 mg Intravenous Q4H PRN Nolberto Hanlon, MD       hydrochlorothiazide (HYDRODIURIL) tablet 12.5 mg  12.5 mg Oral Daily Nolberto Hanlon, MD   12.5 mg at 06/06/23 1030   insulin aspart (novoLOG) injection 0-15 Units  0-15 Units Subcutaneous TID WC Nolberto Hanlon, MD   2 Units at 06/05/23 1800   insulin aspart (novoLOG) injection 0-5 Units  0-5 Units Subcutaneous QHS Nolberto Hanlon, MD       labetalol (NORMODYNE) injection 20 mg  20 mg Intravenous Q2H PRN Nolberto Hanlon, MD       loratadine (CLARITIN) tablet 10 mg  10 mg Oral Daily Pokhrel, Laxman, MD   10 mg at 06/06/23 1030   LORazepam (ATIVAN) injection 1 mg  1 mg Intravenous Q4H PRN Nolberto Hanlon, MD       losartan (COZAAR) tablet 100 mg  100 mg Oral Daily Nolberto Hanlon, MD   100 mg at 06/06/23 1028   magnesium oxide (MAG-OX) tablet 400 mg  400 mg Oral BID Pokhrel, Laxman, MD   400 mg at 06/06/23 1030   montelukast (SINGULAIR) tablet 10 mg  10 mg Oral Sherene Sires, MD   10 mg at 06/05/23 2052   multivitamin with minerals tablet 1 tablet  1 tablet Oral Daily Pokhrel, Laxman, MD   1 tablet at 06/06/23 1030   naphazoline-glycerin (CLEAR EYES REDNESS) ophth solution 1-2 drop  1-2 drop Both Eyes Q4H Nolberto Hanlon, MD   2 drop at 06/06/23 1032   nystatin (MYCOSTATIN/NYSTOP) topical powder   Topical BID Pokhrel, Laxman, MD   Given at 06/06/23 1032   polyethylene glycol (MIRALAX / GLYCOLAX) packet 17 g  17 g Oral Daily PRN Nolberto Hanlon, MD       polyvinyl alcohol (LIQUIFILM TEARS) 1.4 % ophthalmic solution 1 drop  1 drop Both Eyes Q2H PRN Pokhrel, Laxman, MD       rosuvastatin (CRESTOR) tablet 20 mg  20 mg Oral Daily Nolberto Hanlon, MD   20 mg at 06/06/23 1029   sodium chloride flush (NS)  0.9 % injection 3 mL  3 mL Intravenous Q12H Nolberto Hanlon, MD   3 mL at 06/06/23 1031   thiamine (VITAMIN B1) tablet 100 mg  100 mg Oral Daily Pokhrel, Laxman, MD   100 mg at 06/06/23 1028     Discharge Medications: Please see discharge summary for a list of discharge medications.  Relevant Imaging Results:  Relevant Lab Results:   Additional Information SS#: [id number]  Baldemar Lenis, LCSW

## 2023-06-06 NOTE — Evaluation (Signed)
Physical Therapy Evaluation Patient Details Name: Clarence Hamilton MRN: [id number] DOB: Jul 15, 1956 Today's Date: 06/06/2023  History of Present Illness  Pt is a 67 yo male that presented to [facility] on 8/4 for AMS. Workup for acute toxic metabolic encephalopathy, sepsis secondary to UTI, seizure like activity, AKI. Urine drug screen positive for opiates benzos and cannabis. Pt transferred to [facility] for continuous EEG on 8/8. PMH of asthma/COPD, HTN, prediabetes, HLD, chronic pain syndrome, anxiety, depression.  Clinical Impression  Pt admitted with above diagnosis and presents to PT with functional limitations due to deficits listed below (See PT problem list). Pt needs skilled PT to maximize independence and safety. Prior to admission pt lived alone. Currently pt requiring assist for all mobility as well as cognitive deficits.          If plan is discharge home, recommend the following: A little help with walking and/or transfers;A little help with bathing/dressing/bathroom;Assistance with cooking/housework;Direct supervision/assist for medications management;Direct supervision/assist for financial management;Help with stairs or ramp for entrance;Assist for transportation   Can travel by private vehicle   Yes    Equipment Recommendations Rolling walker (2 wheels) (vs rollator)  Recommendations for Other Services       Functional Status Assessment Patient has had a recent decline in their functional status and demonstrates the ability to make significant improvements in function in a reasonable and predictable amount of time.     Precautions / Restrictions Precautions Precautions: Fall Restrictions Weight Bearing Restrictions: No      Mobility  Bed Mobility Overal bed mobility: Needs Assistance Bed Mobility: Supine to Sit     Supine to sit: Min assist     General bed mobility comments: Assist to elevate trunk into sitting    Transfers Overall transfer level: Needs  assistance Equipment used: Rolling walker (2 wheels) Transfers: Sit to/from Stand, Bed to chair/wheelchair/BSC Sit to Stand: Min assist   Step pivot transfers: Min assist       General transfer comment: Assist to power up and for balance. Verbal cues for hand placement. Bed to chair with walker with assist for balance.    Ambulation/Gait Ambulation/Gait assistance: Min assist Gait Distance (Feet): 35 Feet Assistive device: Rolling walker (2 wheels) Gait Pattern/deviations: Step-through pattern, Decreased step length - right, Decreased step length - left, Decreased stride length, Shuffle, Trunk flexed, Wide base of support Gait velocity: decr Gait velocity interpretation: <1.31 ft/sec, indicative of household ambulator   General Gait Details: Assist for balance and support  Stairs            Wheelchair Mobility     Tilt Bed    Modified Rankin (Stroke Patients Only)       Balance Overall balance assessment: Needs assistance Sitting-balance support: No upper extremity supported, Feet supported Sitting balance-Leahy Scale: Good     Standing balance support: Bilateral upper extremity supported, During functional activity, Reliant on assistive device for balance Standing balance-Leahy Scale: Poor Standing balance comment: walker and min guard for static standing                             Pertinent Vitals/Pain Pain Assessment Pain Assessment: No/denies pain    Home Living Family/patient expects to be discharged to:: Private residence Living Arrangements: Alone Available Help at Discharge: Family;Available PRN/intermittently Type of Home: House Home Access: Ramped entrance       Home Layout: One level Home Equipment: Shower seat - built in  Prior Function Prior Level of Function : Independent/Modified Independent                     Extremity/Trunk Assessment   Upper Extremity Assessment Upper Extremity Assessment: Defer to OT  evaluation    Lower Extremity Assessment Lower Extremity Assessment: Generalized weakness       Communication   Communication Communication: No apparent difficulties  Cognition Arousal: Alert Behavior During Therapy: WFL for tasks assessed/performed Overall Cognitive Status: No family/caregiver present to determine baseline cognitive functioning Area of Impairment: Orientation, Safety/judgement, Awareness                 Orientation Level: Disoriented to, Time       Safety/Judgement: Decreased awareness of deficits, Decreased awareness of safety Awareness: Intellectual   General Comments: Pt's statements about his home not consistent with reports in chart. Suspect there may be inability/inaccuracy in his understanding of the situation.        General Comments General comments (skin integrity, edema, etc.): VSS on RA. SpO2 100% after amb    Exercises     Assessment/Plan    PT Assessment Patient needs continued PT services  PT Problem List Decreased strength;Decreased activity tolerance;Decreased balance;Decreased mobility;Decreased knowledge of use of DME;Decreased safety awareness       PT Treatment Interventions DME instruction;Gait training;Functional mobility training;Therapeutic activities;Therapeutic exercise;Balance training;Cognitive remediation;Patient/family education    PT Goals (Current goals can be found in the Care Plan section)  Acute Rehab PT Goals Patient Stated Goal: To redecorate his house PT Goal Formulation: With patient Time For Goal Achievement: 06/20/23 Potential to Achieve Goals: Good    Frequency Min 1X/week     Co-evaluation               AM-PAC PT "6 Clicks" Mobility  Outcome Measure Help needed turning from your back to your side while in a flat bed without using bedrails?: A Little Help needed moving from lying on your back to sitting on the side of a flat bed without using bedrails?: A Little Help needed moving to  and from a bed to a chair (including a wheelchair)?: A Little Help needed standing up from a chair using your arms (e.g., wheelchair or bedside chair)?: A Little Help needed to walk in hospital room?: A Little Help needed climbing 3-5 steps with a railing? : A Lot 6 Click Score: 17    End of Session Equipment Utilized During Treatment: Gait belt Activity Tolerance: Patient tolerated treatment well Patient left: in chair;with call bell/phone within reach;with chair alarm set;with nursing/sitter in room   PT Visit Diagnosis: Unsteadiness on feet (R26.81);Other abnormalities of gait and mobility (R26.89);Muscle weakness (generalized) (M62.81)    Time: 1610-9604 PT Time Calculation (min) (ACUTE ONLY): 36 min   Charges:   PT Evaluation $PT Eval Moderate Complexity: 1 Mod PT Treatments $Gait Training: 8-22 mins PT General Charges $$ ACUTE PT VISIT: 1 Visit         [facility] PT Acute Rehabilitation Services Office [phone]   Angelina Ok [facility] 06/06/2023, 11:58 AM

## 2023-06-06 NOTE — Care Management Important Message (Signed)
Important Message  Patient Details  Name: Clarence Hamilton MRN: [id number] Date of Birth: April 25, 1956   Medicare Important Message Given:  Yes      Stefan Church 06/06/2023, 3:10 PM

## 2023-06-07 DIAGNOSIS — E440 Moderate protein-calorie malnutrition: Secondary | ICD-10-CM | POA: Diagnosis not present

## 2023-06-07 DIAGNOSIS — A419 Sepsis, unspecified organism: Secondary | ICD-10-CM | POA: Diagnosis not present

## 2023-06-07 DIAGNOSIS — R410 Disorientation, unspecified: Secondary | ICD-10-CM | POA: Diagnosis not present

## 2023-06-07 DIAGNOSIS — F419 Anxiety disorder, unspecified: Secondary | ICD-10-CM | POA: Diagnosis not present

## 2023-06-07 LAB — GLUCOSE, CAPILLARY
Glucose-Capillary: 102 mg/dL — ABNORMAL HIGH (ref 70–99)
Glucose-Capillary: 154 mg/dL — ABNORMAL HIGH (ref 70–99)
Glucose-Capillary: 111 mg/dL — ABNORMAL HIGH (ref 70–99)
Glucose-Capillary: 184 mg/dL — ABNORMAL HIGH (ref 70–99)
Glucose-Capillary: 104 mg/dL — ABNORMAL HIGH (ref 70–99)

## 2023-06-07 NOTE — Progress Notes (Signed)
PROGRESS NOTE    Clarence Hamilton  HYQ:[id number] DOB: October 02, 1956 DOA: 06/02/2023 PCP: Malva Limes, MD    Brief Narrative:   Clarence Hamilton is a 67 y.o. male with medical history significant of mild persistent Asthma/COPD On oxygen at 3 liters, intolerant to steroids, HTN, HLD, Prediabetes, Lumbar radiculopathy, chronic pain syndrome,anxiety and depression Hospital initially to [facility] regional on May 29, 2023 with altered mental status when he was noted to be lethargic and had 1 word answers.  Initial labs showed hyponatremia with sodium of 125 with potassium low at 2.0 with leukocytosis at 19.6.  Had platelet count of 725 and urine drug screen was positive for opiates benzos and cannabis.  Patient was given broad-spectrum antibiotic and was admitted hospital for possible sepsis secondary to UTI but despite antibiotic treatment patient continued to have altered mental status and had an episodic seizure-like activity.  MRI of the brain was negative but then due to recurrent seizure-like activity patient was transferred to [facility].   During hospitalization, patient has been followed by psychiatry and there is concern for benzodiazepine intoxication.  Xanax has been discontinued and transitioned on long-acting benzodiazepines.  Currently awaiting for skilled nursing facility placement.  Assessment and Plan:  Sepsis secondary to E coli UTI  Resolved.  Diagnosed on initial admission at [facility].    CT scan of the chest abdomen pelvis was nonacute. Review of urine cultures showed  pansensitive for E. coli.  Patient has completed course of antibiotic.   Temperature max of 99.67F.  Leukocytosis has resolved.  Lyme disease serology negative.  Blood cultures negative in 4 days.  Anemia.  Latest Hemoglobin of 8.5 from 7.9.  No reported blood loss.  Will continue to monitor.  Mild hypokalemia.  Replaced.  Check levels in AM.  Mild hypomagnesemia.  Has been replenished.  Check magnesium  level in AM.  Altered mental status,  Resolved at this time.  Concern for benzodiazepine intoxication. Anxiety disorder. Psychiatry the patient during hospitalization and impression was possible benzodiazepine intoxication versus unspecified dementia. Had seizure-like activity so MRI brain was obtained which was  negative for acute findings..   Patient was on gabapentin Flexeril at home which is currently on hold.  Psychiatry on board and is on Valium with Xanax discontinued.  Long-term EEG which was negative for seizures.  Neurology also followed the patient during hospitalization.  TSH level at 0.3.  Psychiatry has signed off at this time and recommended Valium 15 mg 3 times daily and would decrease by 10 to 20% monthly as tolerated  Groin rash.  Likely candidal intertrigo.  Continue nystatin powder..   Conjuctival injection -bilaterally.  Stuffy nose.  On Cipro eyedrops teardrops Claritin and Flonase.  Still has right eye redness.  Overall improving.  COPD with acute exacerbation  Compensated continue bronchodilators Singulair.  Nutrition Status: Body mass index is 32.25 kg/m.  Body mass index is 32.25 kg/m.  Nutrition Problem: Moderate Malnutrition Etiology: social / environmental circumstances Signs/Symptoms: mild muscle depletion, mild fat depletion, moderate muscle depletion Interventions: Ensure Enlive (each supplement provides 350kcal and 20 grams of protein)   Debility, deconditioning, generalized weakness.  Seen by physical therapy and recommend skilled nursing facility placement.    DVT prophylaxis: enoxaparin (LOVENOX) injection 40 mg Start: 06/03/23 0800 SCDs Start: 06/02/23 2138   Code Status:     Code Status: Full Code  Disposition:  Physical therapy has recommended skilled nursing facility placement.  Patient is medically stable for disposition.  Status  is: Inpatient  Remains inpatient appropriate because: Need for rehabilitation,   Family Communication:  None at bedside  Consultants:  Neurology Psychiatry  Procedures:  Continuous EEG  Antimicrobials:  None   Anti-infectives (From admission, onward)    None      Subjective: Today, patient was seen and examined at bedside.  Complains of mild redness in the eyes but overall better.  Complains of weakness.  PT has recommended skilled nursing facility.  Denies any sweating, hallucinations, tremors.  Objective: Vitals:   06/06/23 2330 06/07/23 0335 06/07/23 0826 06/07/23 1107  BP: (!) 143/73 (!) 141/79 (!) 145/86 (!) 152/86  Pulse: 72 67 69 70  Resp: 16 18 18 18   Temp: 99.3 F (37.4 C) 98.8 F (37.1 C) 98.7 F (37.1 C) 98.1 F (36.7 C)  TempSrc: Oral Oral Oral Axillary  SpO2: 96% 91% 95% 96%  Weight:    96.2 kg  Height:    5\' 8"  (1.727 m)    Intake/Output Summary (Last 24 hours) at 06/07/2023 1410 Last data filed at 06/07/2023 1305 Gross per 24 hour  Intake --  Output 7775 ml  Net -7775 ml   Filed Weights   06/07/23 1107  Weight: 96.2 kg    Physical Examination: Body mass index is 32.25 kg/m.   General:  Average built, not in obvious distress, on room air. HENT:   No scleral pallor or icterus noted.  Mild bilateral congestion but more on the right side. Chest:  Clear breath sounds. No crackles or wheezes.  CVS: S1 &S2 heard. No murmur.  Regular rate and rhythm.  Groin area with candidiasis. Abdomen: Soft, nontender, nondistended.  Bowel sounds are heard.   Extremities: No cyanosis, clubbing or edema.  Peripheral pulses are palpable. Psych: Alert, awake Communicative. CNS:  No cranial nerve deficits.  Moves all extremities Skin: Warm and dry.  No rashes noted.  Data Reviewed:   CBC: Recent Labs  Lab 06/01/23 0412 06/03/23 0530 06/04/23 0308 06/06/23 0159  WBC 13.2* 15.9* 13.7* 8.7  NEUTROABS  --  12.6*  --   --   HGB 9.4* 7.9* 8.3* 8.5*  HCT 26.7* 23.7* 24.5* 25.0*  MCV 86.1 90.5 92.1 89.9  PLT 666* 507* 457* 383    Basic Metabolic  Panel: Recent Labs  Lab 05/31/23 1632 06/01/23 0412 06/02/23 0405 06/03/23 0530 06/04/23 0308 06/06/23 0159  NA 136 139 135 136 133* 137  K 3.7 3.1* 3.9 4.1 3.2* 3.4*  CL 101 102 100 99 99 101  CO2 24 27 25 26 26 27   GLUCOSE 105* 116* 105* 94 96 114*  BUN 18 21 17 19 20 21   CREATININE 1.32* 1.24 1.13 1.23 1.16 1.68*  CALCIUM 8.9 8.7* 8.5* 8.5* 8.3* 8.3*  MG 2.2 2.2 1.8  --  1.7 1.6*  PHOS 2.6 3.0 3.3  --   --   --     Liver Function Tests: Recent Labs  Lab 06/01/23 0412  AST 45*  ALT 58*  ALKPHOS 70  BILITOT 0.9  PROT 6.4*  ALBUMIN 3.2*     Radiology Studies: No results found.    LOS: 5 days    Joycelyn Das, MD [facility] Available via Epic secure chat 7am-7pm After these hours, please refer to coverage provider listed on [email or web address] 06/07/2023, 2:10 PM

## 2023-06-07 NOTE — Plan of Care (Signed)
  Problem: Education: Goal: Knowledge of General Education information will improve Description: Including pain rating scale, medication(s)/side effects and non-pharmacologic comfort measures Outcome: Progressing   Problem: Health Behavior/Discharge Planning: Goal: Ability to manage health-related needs will improve Outcome: Progressing   Problem: Education: Goal: Ability to describe self-care measures that may prevent or decrease complications (Diabetes Survival Skills Education) will improve Outcome: Progressing   Problem: Coping: Goal: Ability to adjust to condition or change in health will improve Outcome: Progressing

## 2023-06-08 DIAGNOSIS — R4182 Altered mental status, unspecified: Secondary | ICD-10-CM | POA: Diagnosis not present

## 2023-06-08 DIAGNOSIS — F419 Anxiety disorder, unspecified: Secondary | ICD-10-CM | POA: Diagnosis not present

## 2023-06-08 DIAGNOSIS — E440 Moderate protein-calorie malnutrition: Secondary | ICD-10-CM | POA: Diagnosis not present

## 2023-06-08 DIAGNOSIS — A419 Sepsis, unspecified organism: Secondary | ICD-10-CM | POA: Diagnosis not present

## 2023-06-08 LAB — GLUCOSE, CAPILLARY
Glucose-Capillary: 97 mg/dL (ref 70–99)
Glucose-Capillary: 126 mg/dL — ABNORMAL HIGH (ref 70–99)
Glucose-Capillary: 129 mg/dL — ABNORMAL HIGH (ref 70–99)
Glucose-Capillary: 92 mg/dL (ref 70–99)
Glucose-Capillary: 97 mg/dL (ref 70–99)

## 2023-06-08 NOTE — Progress Notes (Signed)
PROGRESS NOTE    Clarence Hamilton  ZHY:[id number] DOB: 03-23-1956 DOA: 06/02/2023 PCP: Malva Limes, MD    Brief Narrative:   Clarence Hamilton is a 67 y.o. male with medical history significant of mild persistent Asthma/COPD On oxygen at 3 liters, intolerant to steroids, HTN, HLD, Prediabetes, Lumbar radiculopathy, chronic pain syndrome,anxiety and depression Hospital initially to [facility] regional on May 29, 2023 with altered mental status when he was noted to be lethargic and had 1 word answers.  Initial labs showed hyponatremia with sodium of 125 with potassium low at 2.0 with leukocytosis at 19.6.  Had platelet count of 725 and urine drug screen was positive for opiates benzos and cannabis.  Patient was given broad-spectrum antibiotic and was admitted hospital for possible sepsis secondary to UTI but despite antibiotic treatment patient continued to have altered mental status and had an episodic seizure-like activity.  MRI of the brain was negative but then due to recurrent seizure-like activity patient was transferred to [facility].   During hospitalization, patient has been followed by psychiatry and there is concern for benzodiazepine intoxication.  Xanax has been discontinued and transitioned on long-acting benzodiazepines.  Psychiatry has signed off with recommendation for gradual taper.  Currently awaiting for skilled nursing facility placement.  Assessment and Plan:  Sepsis secondary to E coli UTI  Resolved.  Diagnosed on initial admission at [facility].    CT scan of the chest abdomen pelvis was nonacute. Review of urine cultures showed  pansensitive for E. coli.  Patient has completed course of antibiotic.   Temperature max of 99.12F.  Leukocytosis has resolved.  Lyme disease serology negative.  Blood cultures negative in 5 days.  Anemia.  Latest Hemoglobin of 8.4 from 7.9.  No reported blood loss.  Will continue to monitor.  Mild hypokalemia.  Replaced.  Latest potassium  of 3.5.  Mild hypomagnesemia.  Has been replenished.  Latest magnesium of 2.0  Altered mental status,  Resolved at this time.  Concern for benzodiazepine intoxication. Anxiety disorder. Psychiatry the patient during hospitalization and impression was possible benzodiazepine intoxication versus unspecified dementia. Had seizure-like activity so MRI brain was obtained which was  negative for acute findings..   Patient was on gabapentin Flexeril at home which is currently on hold.  Psychiatry on board and is on Valium with Xanax discontinued.  Long-term EEG which was negative for seizures.  Neurology also followed the patient during hospitalization.  TSH level at 0.3.  Psychiatry has signed off at this time and recommended Valium 15 mg 3 times daily and would decrease by 10 to 20% monthly as tolerated  Groin rash.  Likely candidal intertrigo.  Continue nystatin powder..   Conjuctival injection bilaterally.  Stuffy nose.  On Cipro eyedrops, teardrops Claritin and Flonase.  Still has right eye redness.  Overall improving.  COPD with acute exacerbation  Compensated continue bronchodilators Singulair.  Nutrition Status: Body mass index is 32.25 kg/m.  Body mass index is 32.25 kg/m.  Nutrition Problem: Moderate Malnutrition Etiology: social / environmental circumstances Signs/Symptoms: mild muscle depletion, mild fat depletion, moderate muscle depletion Interventions: Ensure Enlive (each supplement provides 350kcal and 20 grams of protein)   Debility, deconditioning, generalized weakness.   Seen by physical therapy and recommend skilled nursing facility placement.    DVT prophylaxis: enoxaparin (LOVENOX) injection 40 mg Start: 06/03/23 0800 SCDs Start: 06/02/23 2138   Code Status:     Code Status: Full Code  Disposition:  Physical therapy has recommended skilled nursing facility  placement.  Patient is medically stable for disposition.  Status is: Inpatient  Remains inpatient  appropriate because: Need for rehabilitation,   Family Communication: None at bedside  Consultants:  Neurology Psychiatry  Procedures:  Continuous EEG  Antimicrobials:  None   Anti-infectives (From admission, onward)    None      Subjective: Today, patient was seen and examined at bedside complains of mild redness.  Denies any nausea, vomiting headache sweating chest pain.  Awaiting for skilled nursing facility placement  Objective: Vitals:   06/08/23 0358 06/08/23 0746 06/08/23 0832 06/08/23 1108  BP: (!) 142/76 133/81 (!) 140/93 135/80  Pulse: 69 74 76 71  Resp: 15 14 (!) 22 (!) 22  Temp: 98.6 F (37 C) 98.8 F (37.1 C) 98 F (36.7 C) 98.1 F (36.7 C)  TempSrc: Oral Oral Oral Oral  SpO2: 93% 96% 97% 94%  Weight:      Height:        Intake/Output Summary (Last 24 hours) at 06/08/2023 1112 Last data filed at 06/08/2023 1110 Gross per 24 hour  Intake --  Output 5900 ml  Net -5900 ml   Filed Weights   06/07/23 1107  Weight: 96.2 kg    Physical Examination: Body mass index is 32.25 kg/m.   General: Obese built, not in obvious distress, on room air. HENT:   No scleral pallor or icterus noted.  Right eye redness.   Chest:  Clear breath sounds. No crackles or wheezes.  CVS: S1 &S2 heard. No murmur.  Regular rate and rhythm.  Groin area with candidiasis. Abdomen: Soft, nontender, nondistended.  Bowel sounds are heard.   Extremities: No cyanosis, clubbing or edema.  Peripheral pulses are palpable. Psych: Alert, awake Communicative. CNS:  No cranial nerve deficits.  Moves all extremities Skin: Warm and dry.  No rashes noted.  Data Reviewed:   CBC: Recent Labs  Lab 06/03/23 0530 06/04/23 0308 06/06/23 0159 06/08/23 0307  WBC 15.9* 13.7* 8.7 8.5  NEUTROABS 12.6*  --   --   --   HGB 7.9* 8.3* 8.5* 8.4*  HCT 23.7* 24.5* 25.0* 25.4*  MCV 90.5 92.1 89.9 89.4  PLT 507* 457* 383 353    Basic Metabolic Panel: Recent Labs  Lab 06/02/23 0405  06/03/23 0530 06/04/23 0308 06/06/23 0159 06/08/23 0307  NA 135 136 133* 137 136  K 3.9 4.1 3.2* 3.4* 3.5  CL 100 99 99 101 102  CO2 25 26 26 27 24   GLUCOSE 105* 94 96 114* 107*  BUN 17 19 20 21 23   CREATININE 1.13 1.23 1.16 1.68* 1.46*  CALCIUM 8.5* 8.5* 8.3* 8.3* 8.7*  MG 1.8  --  1.7 1.6* 2.0  PHOS 3.3  --   --   --   --     Liver Function Tests: No results for input(s): "AST", "ALT", "ALKPHOS", "BILITOT", "PROT", "ALBUMIN" in the last 168 hours.    Radiology Studies: No results found.    LOS: 6 days    Joycelyn Das, MD [facility] Available via Epic secure chat 7am-7pm After these hours, please refer to coverage provider listed on [email or web address] 06/08/2023, 11:12 AM

## 2023-06-08 NOTE — Progress Notes (Signed)
Notified Dr. Tyson Babinski that Pt. Is asking for an ophthalmology consult.

## 2023-06-08 NOTE — Plan of Care (Signed)

## 2023-06-08 NOTE — Progress Notes (Signed)
Physical Therapy Treatment Patient Details Name: Clarence Hamilton MRN: [id number] DOB: 1956/01/29 Today's Date: 06/08/2023   History of Present Illness Pt is a 67 yo male that presented to [facility] on 8/4 for AMS. Workup for acute toxic metabolic encephalopathy, sepsis secondary to UTI, seizure like activity, AKI. Urine drug screen positive for opiates benzos and cannabis. Pt transferred to [facility] for continuous EEG on 8/8. PMH of asthma/COPD, HTN, prediabetes, HLD, chronic pain syndrome, anxiety, depression.    PT Comments  Mobilizing primarily at [facility] level assist today. Reviewed log roll technique due to reported difficulty with bed mobility and felt this was greatly helpful. Stable with RW for support while standing stationary. Slight instability with gait using RW, cues for posture and foot clearance. Tolerated increased distance challenge today. Reviewed LE exercises. SpO2 98% on RA at rest. Patient will continue to benefit from skilled physical therapy services to further improve independence with functional mobility. Encouraged OOB for dinner since he wanted to return to bed for a while.   If plan is discharge home, recommend the following: A little help with walking and/or transfers;A little help with bathing/dressing/bathroom;Assistance with cooking/housework;Direct supervision/assist for medications management;Direct supervision/assist for financial management;Help with stairs or ramp for entrance;Assist for transportation   Can travel by private vehicle     Yes  Equipment Recommendations  Rolling walker (2 wheels) (vs rollator)    Recommendations for Other Services       Precautions / Restrictions Precautions Precautions: Fall Restrictions Weight Bearing Restrictions: No     Mobility  Bed Mobility Overal bed mobility: Needs Assistance Bed Mobility: Rolling, Sidelying to Sit, Sit to Sidelying Rolling: Supervision Sidelying to sit: Contact guard assist     Sit to sidelying:  Contact guard assist General bed mobility comments: CGA with techniques to rise from bed using log roll. Tactile cues to facilitate correct movements. Tolrated well.    Transfers Overall transfer level: Needs assistance Equipment used: Rolling walker (2 wheels) Transfers: Sit to/from Stand, Bed to chair/wheelchair/BSC Sit to Stand: Contact guard assist           General transfer comment: CGA from low bed surface. Cues for set up and hand placement. RW for support upon rising, stable.    Ambulation/Gait Ambulation/Gait assistance: Contact guard assist Gait Distance (Feet): 105 Feet Assistive device: Rolling walker (2 wheels) Gait Pattern/deviations: Step-through pattern, Decreased step length - right, Decreased step length - left, Decreased stride length, Shuffle, Trunk flexed, Wide base of support Gait velocity: decr Gait velocity interpretation: <1.31 ft/sec, indicative of household ambulator   General Gait Details: Slight shuffled gait. Cues for upright posture, scanning for hazards in hallway, and for larger step length and foot clearance. No overt buckling noted. Slow and needs cues to direct back to room. RW for support throughout.   Stairs             Wheelchair Mobility     Tilt Bed    Modified Rankin (Stroke Patients Only)       Balance Overall balance assessment: Needs assistance Sitting-balance support: No upper extremity supported, Feet supported Sitting balance-Leahy Scale: Good     Standing balance support: During functional activity, No upper extremity supported Standing balance-Leahy Scale: Fair Standing balance comment: Stands with CGA no UE Suppoert, improved stability with support..                            Cognition Arousal: Alert Behavior During Therapy: WFL for  tasks assessed/performed Overall Cognitive Status: No family/caregiver present to determine baseline cognitive functioning                                  General Comments: Answers questions appropriately. Having some difficulty with recall (short and long term)        Exercises General Exercises - Lower Extremity Ankle Circles/Pumps: AROM, Both, 10 reps, Supine Quad Sets: Strengthening, Both, 10 reps, Supine Gluteal Sets: Strengthening, Both, 10 reps, Supine Hip ABduction/ADduction: Strengthening, Both, 10 reps, Supine    General Comments        Pertinent Vitals/Pain Pain Assessment Pain Assessment: No/denies pain Pain Intervention(s): Monitored during session    Home Living                          Prior Function            PT Goals (current goals can now be found in the care plan section) Acute Rehab PT Goals Patient Stated Goal: To redecorate his house PT Goal Formulation: With patient Time For Goal Achievement: 06/20/23 Potential to Achieve Goals: Good Progress towards PT goals: Progressing toward goals    Frequency    Min 1X/week      PT Plan      Co-evaluation              AM-PAC PT "6 Clicks" Mobility   Outcome Measure  Help needed turning from your back to your side while in a flat bed without using bedrails?: A Little Help needed moving from lying on your back to sitting on the side of a flat bed without using bedrails?: A Little Help needed moving to and from a bed to a chair (including a wheelchair)?: A Little Help needed standing up from a chair using your arms (e.g., wheelchair or bedside chair)?: A Little Help needed to walk in hospital room?: A Little Help needed climbing 3-5 steps with a railing? : A Lot 6 Click Score: 17    End of Session Equipment Utilized During Treatment: Gait belt Activity Tolerance: Patient tolerated treatment well Patient left: with call bell/phone within reach;in bed;with bed alarm set;with SCD's reapplied Nurse Communication: Mobility status PT Visit Diagnosis: Unsteadiness on feet (R26.81);Other abnormalities of gait and mobility  (R26.89);Muscle weakness (generalized) (M62.81)     Time: 9147-8295 PT Time Calculation (min) (ACUTE ONLY): 16 min  Charges:    $Gait Training: 8-22 mins PT General Charges $$ ACUTE PT VISIT: 1 Visit                     Kathlyn Sacramento, PT, DPT [facility]  Rehabilitation Services Physical Therapist Office: [phone] Website: [email or web address]    Berton Mount 06/08/2023, 4:47 PM

## 2023-06-08 NOTE — TOC Progression Note (Addendum)
Transition of Care [facility]) - Progression Note    Patient Details  Name: Clarence Hamilton MRN: [id number] Date of Birth: 06/14/1956  Transition of [facility] CM/SW Contact  Baldemar Lenis, [place] Phone Number: 06/08/2023, 11:49 AM  Clinical Narrative:  Patient with only one bed offer, multiple pending offers. CSW contacted pending offers to discuss referral and get responses. CSW met with patient to discuss offers available for SNF. Patient in agreement, said he wanted to be in [place] and chose [facility]. CSW asked about looking in [place] as there were not many options and patient is not interested in [place]. Lengthy discussion had with patient in the room engaging in tangential topics and difficult to redirect back to the topic of SNF placement. After meeting with patient, CSW attempted to contact patient's nephew, Nida Boatman, to confirm plan for SNF placement and update on the patient's choices as the patient still seems to be slightly altered, left a voicemail. CSW to follow.   CSW uploaded requested information to [facility] for PASRR review, patient's PASRR number has been obtained: [id number], approved 06/07/23 through 07/07/23.    Expected Discharge Plan: Skilled Nursing Facility    Expected Discharge Plan and Services                                               Social Determinants of Health (SDOH) Interventions SDOH Screenings   Food Insecurity: No Food Insecurity (06/04/2023)  Housing: Patient Declined (06/04/2023)  Transportation Needs: No Transportation Needs (06/04/2023)  Utilities: At Risk (06/04/2023)  Alcohol Screen: Low Risk  (02/26/2022)  Depression (PHQ2-9): Medium Risk (02/26/2022)  Financial Resource Strain: Low Risk  (10/22/2021)  Physical Activity: Inactive (03/25/2020)  Social Connections: Socially Isolated (03/25/2020)  Stress: No Stress Concern Present (03/25/2020)  Tobacco Use: Low Risk  (06/04/2023)    Readmission Risk Interventions      No data to display

## 2023-06-09 DIAGNOSIS — E440 Moderate protein-calorie malnutrition: Secondary | ICD-10-CM | POA: Diagnosis not present

## 2023-06-09 DIAGNOSIS — F419 Anxiety disorder, unspecified: Secondary | ICD-10-CM | POA: Diagnosis not present

## 2023-06-09 DIAGNOSIS — R4182 Altered mental status, unspecified: Secondary | ICD-10-CM | POA: Diagnosis not present

## 2023-06-09 DIAGNOSIS — A419 Sepsis, unspecified organism: Secondary | ICD-10-CM | POA: Diagnosis not present

## 2023-06-09 LAB — BASIC METABOLIC PANEL
Anion gap: 10 (ref 5–15)
BUN: 22 mg/dL (ref 8–23)
CO2: 26 mmol/L (ref 22–32)
Calcium: 8.8 mg/dL — ABNORMAL LOW (ref 8.9–10.3)
Chloride: 100 mmol/L (ref 98–111)
Creatinine, Ser: 1.56 mg/dL — ABNORMAL HIGH (ref 0.61–1.24)
GFR, Estimated: 49 mL/min — ABNORMAL LOW (ref >60)
Glucose, Bld: 97 mg/dL (ref 70–99)
Potassium: 3.9 mmol/L (ref 3.5–5.1)
Sodium: 136 mmol/L (ref 135–145)

## 2023-06-09 LAB — GLUCOSE, CAPILLARY
Glucose-Capillary: 97 mg/dL (ref 70–99)
Glucose-Capillary: 197 mg/dL — ABNORMAL HIGH (ref 70–99)
Glucose-Capillary: 98 mg/dL (ref 70–99)
Glucose-Capillary: 125 mg/dL — ABNORMAL HIGH (ref 70–99)

## 2023-06-09 LAB — CBC
HCT: 28.1 % — ABNORMAL LOW (ref 39.0–52.0)
Hemoglobin: 9.3 g/dL — ABNORMAL LOW (ref 13.0–17.0)
MCH: 30 pg (ref 26.0–34.0)
MCHC: 33.1 g/dL (ref 30.0–36.0)
MCV: 90.6 fL (ref 80.0–100.0)
Platelets: 318 10*3/uL (ref 150–400)
RBC: 3.1 MIL/uL — ABNORMAL LOW (ref 4.22–5.81)
RDW: 14.5 % (ref 11.5–15.5)
WBC: 7.7 10*3/uL (ref 4.0–10.5)
nRBC: 0 % (ref 0.0–0.2)

## 2023-06-09 LAB — MAGNESIUM: Magnesium: 2.1 mg/dL (ref 1.7–2.4)

## 2023-06-09 MED ORDER — BOOST / RESOURCE BREEZE PO LIQD CUSTOM
1.0000 | Freq: Three times a day (TID) | ORAL | Status: DC
  Administered 2023-06-09 – 2023-06-14 (×17): 1 via ORAL

## 2023-06-09 MED ORDER — AZELASTINE HCL 0.1 % NA SOLN
1.0000 | Freq: Two times a day (BID) | NASAL | Status: DC
  Administered 2023-06-09 – 2023-06-14 (×10): 1 via NASAL
  Filled 2023-06-09: qty 30

## 2023-06-09 NOTE — Progress Notes (Signed)
PROGRESS NOTE    Clarence Hamilton  ION:[id number] DOB: 02-17-1956 DOA: 06/02/2023 PCP: Malva Limes, MD    Brief Narrative:   Clarence Hamilton is a 67 y.o. male with medical history significant of mild persistent Asthma/COPD On oxygen at 3 liters, intolerant to steroids, HTN, HLD, Prediabetes, Lumbar radiculopathy, chronic pain syndrome,anxiety and depression Hospital initially to [facility] on May 29, 2023 with altered mental status when he was noted to be lethargic and had 1 word answers.  Initial labs showed hyponatremia with sodium of 125 with potassium low at 2.0 with leukocytosis at 19.6.  Had platelet count of 725 and urine drug screen was positive for opiates benzos and cannabis.  Patient was given broad-spectrum antibiotic and was admitted hospital for possible sepsis secondary to UTI but despite antibiotic treatment patient continued to have altered mental status and had an episodic seizure-like activity.  MRI of the brain was negative but then due to recurrent seizure-like activity patient was transferred to [facility].   During hospitalization, patient has been followed by psychiatry due to concern for benzodiazepine intoxication.  Xanax has been discontinued and transitioned on long-acting benzodiazepines.  Psychiatry has signed off with recommendation for gradual taper.  Currently awaiting for skilled nursing facility placement.  Assessment and Plan:  Sepsis secondary to E coli UTI  Resolved.  Diagnosed on initial admission at [facility].    CT scan of the chest abdomen pelvis was nonacute. Review of urine cultures showed  pansensitive for E. coli.  Patient has completed course of antibiotic.   Temperature max of 99.57F.  Leukocytosis has resolved.  Lyme disease serology negative.  Blood cultures negative in >5 days.  Anemia.  Latest Hemoglobin of 9.3 from 8.4 <7.9.  No reported blood loss.  Will continue to monitor.  Mild hypokalemia.  Resolved  Mild  hypomagnesemia.  Has been replenished.  Latest magnesium of 2.1  Altered mental status,  Resolved at this time.  Fully alert awake and oriented.  Concern for benzodiazepine intoxication. Anxiety disorder. Psychiatry the patient during hospitalization and impression was possible benzodiazepine intoxication versus unspecified dementia. Had seizure-like activity so MRI brain was obtained which was  negative for acute findings..   Patient was on gabapentin Flexeril at home which is currently on hold.  Psychiatry on board and is on Valium with Xanax discontinued.  Long-term EEG which was negative for seizures.  Neurology also followed the patient during hospitalization.  TSH level at 0.3.  Psychiatry has signed off at this time and recommended Valium 15 mg 3 times daily and would decrease by 10 to 20% monthly as tolerated  Groin rash.  Likely candidal intertrigo.  Continue nystatin powder..   Conjuctival injection >right eye.  To be secondary to prolonged eye opening on presentation with dryness.  Extraocular movement intact, none painful.  Blurriness but able to count fingers and see movements.  Stuffy nose.  On Cipro eyedrops, teardrops, Claritin and Flonase.   Overall improving.  COPD with acute exacerbation  Compensated continue bronchodilators Singulair.  Nutrition Status: Body mass index is 32.25 kg/m.  Body mass index is 32.25 kg/m.  Nutrition Problem: Moderate Malnutrition Etiology: social / environmental circumstances Signs/Symptoms: mild muscle depletion, mild fat depletion, moderate muscle depletion Interventions: Ensure Enlive (each supplement provides 350kcal and 20 grams of protein)   Debility, deconditioning, generalized weakness.   Seen by physical therapy and recommend skilled nursing facility placement.    DVT prophylaxis: enoxaparin (LOVENOX) injection 40 mg Start: 06/03/23 0800 SCDs  Start: 06/02/23 2138   Code Status:     Code Status: Full Code  Disposition:   Physical therapy has recommended skilled nursing facility placement.  Patient is medically stable for disposition.  Status is: Inpatient  Remains inpatient appropriate because: Need for rehabilitation,   Family Communication: None at bedside  Consultants:  Neurology Psychiatry  Procedures:  Continuous EEG  Antimicrobials:  None   Anti-infectives (From admission, onward)    None      Subjective: Today, patient was seen and examined bedside.  Denies any shortness of breath, fever, tremors, hallucinations or confusion.  Still complains of blurriness of vision but overall redness and inflammation has improved.  Objective: Vitals:   06/08/23 1945 06/08/23 2323 06/09/23 0344 06/09/23 0722  BP: (!) 141/78 (!) 148/77 (!) 140/77 138/80  Pulse: 81 72 61 76  Resp: 18 16 17 20   Temp: 99.8 F (37.7 C) 99 F (37.2 C) 98.5 F (36.9 C) 98.4 F (36.9 C)  TempSrc: Oral Oral Oral Oral  SpO2: 97% 96% 93% 95%  Weight:      Height:        Intake/Output Summary (Last 24 hours) at 06/09/2023 1537 Last data filed at 06/09/2023 1100 Gross per 24 hour  Intake --  Output 5400 ml  Net -5400 ml   Filed Weights   06/07/23 1107  Weight: 96.2 kg    Physical Examination: Body mass index is 32.25 kg/m.   General: Obese built, not in obvious distress, on room air. HENT:   No scleral pallor or icterus noted.  Right eye conjunctival redness mild. Chest:  Clear breath sounds. No crackles or wheezes.  CVS: S1 &S2 heard. No murmur.  Regular rate and rhythm.  Groin area with candidiasis. Abdomen: Soft, nontender, nondistended.  Bowel sounds are heard.   Extremities: No cyanosis, clubbing or edema.  Peripheral pulses are palpable. Psych: Alert, awake Communicative. CNS:  No cranial nerve deficits.  Moves all extremities, extraocular movement intact. Skin: Warm and dry.  No rashes noted.  Data Reviewed:   CBC: Recent Labs  Lab 06/03/23 0530 06/04/23 0308 06/06/23 0159 06/08/23 0307  06/09/23 0803  WBC 15.9* 13.7* 8.7 8.5 7.7  NEUTROABS 12.6*  --   --   --   --   HGB 7.9* 8.3* 8.5* 8.4* 9.3*  HCT 23.7* 24.5* 25.0* 25.4* 28.1*  MCV 90.5 92.1 89.9 89.4 90.6  PLT 507* 457* 383 353 318    Basic Metabolic Panel: Recent Labs  Lab 06/03/23 0530 06/04/23 0308 06/06/23 0159 06/08/23 0307 06/09/23 0803  NA 136 133* 137 136 136  K 4.1 3.2* 3.4* 3.5 3.9  CL 99 99 101 102 100  CO2 26 26 27 24 26   GLUCOSE 94 96 114* 107* 97  BUN 19 20 21 23 22   CREATININE 1.23 1.16 1.68* 1.46* 1.56*  CALCIUM 8.5* 8.3* 8.3* 8.7* 8.8*  MG  --  1.7 1.6* 2.0 2.1    Liver Function Tests: No results for input(s): "AST", "ALT", "ALKPHOS", "BILITOT", "PROT", "ALBUMIN" in the last 168 hours.    Radiology Studies: No results found.    LOS: 7 days    Joycelyn Das, MD [facility] Available via Epic secure chat 7am-7pm After these hours, please refer to coverage provider listed on [email or web address] 06/09/2023, 3:37 PM

## 2023-06-09 NOTE — Progress Notes (Signed)
Occupational Therapy Treatment Patient Details Name: Clarence Hamilton MRN: [id number] DOB: 03-Dec-1955 Today's Date: 06/09/2023   History of present illness Pt is a 67 yo male that presented to [facility] on 8/4 for AMS. Workup for acute toxic metabolic encephalopathy, sepsis secondary to UTI, seizure like activity, AKI. Urine drug screen positive for opiates benzos and cannabis. Pt transferred to [facility] for continuous EEG on 8/8. PMH of asthma/COPD, HTN, prediabetes, HLD, chronic pain syndrome, anxiety, depression.   OT comments  Pt making good progress with functional goals. Pt very talkative, pleasant and cooperative. Pt sat EOB with CGA, stood to RW CGA to walk ton bathroom CGA for safety. Clothing mgt and transfer to toilet CGA. Pt stepped in and out of shower suing grab bar with CGA. OT will continue to follow acutely to maximize level of function and safety      If plan is discharge home, recommend the following:  A little help with walking and/or transfers;A little help with bathing/dressing/bathroom;Assistance with cooking/housework;Direct supervision/assist for financial management;Direct supervision/assist for medications management;Assist for transportation;Help with stairs or ramp for entrance;Supervision due to cognitive status   Equipment Recommendations  Other (comment) (defer)    Recommendations for Other Services      Precautions / Restrictions Precautions Precautions: Fall Restrictions Weight Bearing Restrictions: No       Mobility Bed Mobility Overal bed mobility: Needs Assistance Bed Mobility: Rolling, Sit to Sidelying, Sidelying to Sit   Sidelying to sit: Contact guard assist     Sit to sidelying: Min assist General bed mobility comments: min A with LEs back onto bed    Transfers Overall transfer level: Needs assistance Equipment used: Rolling walker (2 wheels) Transfers: Sit to/from Stand, Bed to chair/wheelchair/BSC Sit to Stand: Contact guard assist                  Balance Overall balance assessment: Needs assistance Sitting-balance support: No upper extremity supported, Feet supported Sitting balance-Leahy Scale: Good     Standing balance support: During functional activity, No upper extremity supported Standing balance-Leahy Scale: Fair                             ADL either performed or assessed with clinical judgement   ADL Overall ADL's : Needs assistance/impaired     Grooming: Wash/dry hands;Wash/dry face;Contact guard assist;Standing                   Engineer, maintenance (IT) (2 wheels);Minimal assistance;Contact guard assist   Toileting- Clothing Manipulation and Hygiene: Contact guard assist   Tub/ Shower Transfer: Contact guard assist;Ambulation;Rolling walker (2 wheels);Grab bars   Functional mobility during ADLs: Contact guard assist;Rolling walker (2 wheels);Cueing for safety      Extremity/Trunk Assessment Upper Extremity Assessment Upper Extremity Assessment: Generalized weakness   Lower Extremity Assessment Lower Extremity Assessment: Defer to PT evaluation   Cervical / Trunk Assessment Cervical / Trunk Assessment: Normal    Vision Baseline Vision/History: 1 Wears glasses Ability to See in Adequate Light: 1 Impaired Patient Visual Report: No change from baseline     Perception     Praxis      Cognition Arousal: Alert Behavior During Therapy: WFL for tasks assessed/performed Overall Cognitive Status: No family/caregiver present to determine baseline cognitive functioning  General Comments: hyperverbose, residrection for task initiation and continuation        Exercises      Shoulder Instructions       General Comments      Pertinent Vitals/ Pain       Pain Assessment Pain Assessment: 0-10 Facial Expression: Relaxed, neutral Pain Location: back Pain Descriptors / Indicators: Aching Pain Intervention(s):  Monitored during session, Repositioned  Home Living                                          Prior Functioning/Environment              Frequency  Min 1X/week        Progress Toward Goals  OT Goals(current goals can now be found in the care plan section)  Progress towards OT goals: Progressing toward goals     Plan      Co-evaluation                 AM-PAC OT "6 Clicks" Daily Activity     Outcome Measure   Help from another person eating meals?: None Help from another person taking care of personal grooming?: A Little Help from another person toileting, which includes using toliet, bedpan, or urinal?: A Little Help from another person bathing (including washing, rinsing, drying)?: A Little Help from another person to put on and taking off regular upper body clothing?: A Little Help from another person to put on and taking off regular lower body clothing?: A Little 6 Click Score: 19    End of Session Equipment Utilized During Treatment: Gait belt;Rolling walker (2 wheels)  OT Visit Diagnosis: Other abnormalities of gait and mobility (R26.89);Unsteadiness on feet (R26.81);Muscle weakness (generalized) (M62.81);Cognitive communication deficit (R41.841)   Activity Tolerance Patient tolerated treatment well   Patient Left with call bell/phone within reach;in bed;with bed alarm set   Nurse Communication          Time: 7829-5621 OT Time Calculation (min): 26 min  Charges: OT General Charges $OT Visit: 1 Visit OT Treatments $Self Care/Home Management : 8-22 mins $Therapeutic Activity: 8-22 mins    Galen Manila 06/09/2023, 1:49 PM

## 2023-06-09 NOTE — Plan of Care (Signed)

## 2023-06-09 NOTE — Progress Notes (Signed)
Nutrition Follow-up  DOCUMENTATION CODES:  Non-severe (moderate) malnutrition in context of social or environmental circumstances  INTERVENTION:  Continue current diet as ordered Encourage PO intake Change supplements to boost breeze per pt request. Each supplement provides 250 kcal and 9 grams of protein  NUTRITION DIAGNOSIS:  Moderate Malnutrition related to social / environmental circumstances as evidenced by mild muscle depletion, mild fat depletion, moderate muscle depletion. - remains applicable   GOAL:  Patient will meet greater than or equal to 90% of their needs - progressing  MONITOR:  PO intake, Supplement acceptance  REASON FOR ASSESSMENT:  Consult Assessment of nutrition requirement/status  ASSESSMENT:  Pt with PMH of asthma/COPD on 3 L O2, HTN, HLD, prediabetes, chronic pain syndrome, anxiety, depression admitted 8/4 to [facility] with AMS. Dx sepsis due to UTI, acute toxic metabolic encephalopathy, AKI, and possible seizures, tx to [facility] for LTM. Per family report on admission pt living in home with feces, dead animals, piles of clothing on floors.   Pt resting in bed at the time of assessment. Breakfast tray noted at bedside ~95% consumed. Pt reports that intake has increased from when he was first admitted. Noted boost breeze at bedside. Pt states that he does not like the ensure anymore but that he really like the breeze. Will adjust order.   Pt reports that dining services has been coming and assisting with taking his orders which he appreciates now that he has an eye injury. PT feels he is making progress with therapy.   Noted that MD states pt is medically ready for discharge whenever placement options are decided upon.   Average Meal Intake: 8/10: 100% intake x 1 recorded meals  Nutritionally Relevant Medications: Scheduled Meds:  Ensure Enlive  237 mL Oral TID BM   hydrochlorothiazide  12.5 mg Oral Daily   insulin aspart  0-15 Units Subcutaneous TID WC    insulin aspart  0-5 Units Subcutaneous QHS   magnesium oxide  400 mg Oral BID   multivitamin with minerals  1 tablet Oral Daily   rosuvastatin  20 mg Oral Daily   PRN Meds: polyethylene glycol  Labs Reviewed: Creatinine 1.56 CBG ranges from 92-129 mg/dL over the last 24 hours   NUTRITION - FOCUSED PHYSICAL EXAM: Flowsheet Row Most Recent Value  Orbital Region Mild depletion  Upper Arm Region Mild depletion  Thoracic and Lumbar Region No depletion  Buccal Region Mild depletion  Temple Region No depletion  Clavicle Bone Region Mild depletion  Clavicle and Acromion Bone Region Mild depletion  Scapular Bone Region No depletion  Dorsal Hand No depletion  Patellar Region Moderate depletion  Anterior Thigh Region Moderate depletion  Posterior Calf Region Moderate depletion  Edema (RD Assessment) Mild  Hair Reviewed  Eyes Reviewed  Mouth Reviewed  Skin Reviewed  Nails Reviewed    Diet Order:   Diet Order             Diet regular Room service appropriate? Yes with Assist; Fluid consistency: Thin  Diet effective now                   EDUCATION NEEDS:   Education needs have been addressed  Skin:  Skin Assessment: Reviewed RN Assessment  Last BM:  8/14 - type 4  Height:   Ht Readings from Last 1 Encounters:  06/07/23 5\' 8"  (1.727 m)    Weight:   Wt Readings from Last 1 Encounters:  06/07/23 96.2 kg    Ideal Body Weight:  70  kg  BMI:  Body mass index is 32.25 kg/m.  Estimated Nutritional Needs:  Kcal:  1900-2200 Protein:  95-110 grams Fluid:  > 1.9 L/day   Greig Castilla, RD, LDN Clinical Dietitian RD pager # available in AMION  After hours/weekend pager # available in [facility]

## 2023-06-10 DIAGNOSIS — G929 Unspecified toxic encephalopathy: Secondary | ICD-10-CM

## 2023-06-10 LAB — GLUCOSE, CAPILLARY
Glucose-Capillary: 119 mg/dL — ABNORMAL HIGH (ref 70–99)
Glucose-Capillary: 97 mg/dL (ref 70–99)
Glucose-Capillary: 205 mg/dL — ABNORMAL HIGH (ref 70–99)
Glucose-Capillary: 104 mg/dL — ABNORMAL HIGH (ref 70–99)

## 2023-06-10 NOTE — Plan of Care (Signed)
  Problem: Education: Goal: Knowledge of General Education information will improve Description: Including pain rating scale, medication(s)/side effects and non-pharmacologic comfort measures Outcome: Progressing   Problem: Clinical Measurements: Goal: Respiratory complications will improve Outcome: Progressing Goal: Cardiovascular complication will be avoided Outcome: Progressing   Problem: Activity: Goal: Risk for activity intolerance will decrease Outcome: Progressing   Problem: Nutrition: Goal: Adequate nutrition will be maintained Outcome: Progressing   Problem: Elimination: Goal: Will not experience complications related to urinary retention Outcome: Progressing   Problem: Skin Integrity: Goal: Risk for impaired skin integrity will decrease Outcome: Progressing   Problem: Fluid Volume: Goal: Ability to maintain a balanced intake and output will improve Outcome: Progressing   Problem: Nutritional: Goal: Maintenance of adequate nutrition will improve Outcome: Progressing   Problem: Skin Integrity: Goal: Risk for impaired skin integrity will decrease Outcome: Progressing

## 2023-06-10 NOTE — TOC Progression Note (Signed)
Transition of Care [facility]) - Progression Note    Patient Details  Name: Clarence Hamilton MRN: [id number] Date of Birth: Jan 15, 1956  Transition of Care [facility]) CM/SW Contact  Baldemar Lenis, [place] Phone Number: 06/10/2023, 10:42 AM  Clinical Narrative:   CSW received update from nephew, Nida Boatman, that he was working and unable to call; working again today but can be available via text. CSW updated nephew on SNF offers and plan for [facility], and nephew wants to see what other [facility] options are available. Nephew asked why patient was not getting offers, and CSW guessed it was the patient's social situation; [place] indicated understanding. CSW faxed out referral to [facility], will update nephew with options to review.    Expected Discharge Plan: Skilled Nursing Facility    Expected Discharge Plan and Services                                               Social Determinants of Health (SDOH) Interventions SDOH Screenings   Food Insecurity: No Food Insecurity (06/04/2023)  Housing: Patient Declined (06/04/2023)  Transportation Needs: No Transportation Needs (06/04/2023)  Utilities: At Risk (06/04/2023)  Alcohol Screen: Low Risk  (02/26/2022)  Depression (PHQ2-9): Medium Risk (02/26/2022)  Financial Resource Strain: Low Risk  (10/22/2021)  Physical Activity: Inactive (03/25/2020)  Social Connections: Socially Isolated (03/25/2020)  Stress: No Stress Concern Present (03/25/2020)  Tobacco Use: Low Risk  (06/04/2023)    Readmission Risk Interventions     No data to display

## 2023-06-10 NOTE — TOC Progression Note (Signed)
Transition of Care [facility]) - Progression Note    Patient Details  Name: Clarence Hamilton MRN: [id number] Date of Birth: 11-Jan-1956  Transition of Care [facility]) CM/SW Contact  Baldemar Lenis, [place] Phone Number: 06/10/2023, 10:46 AM  Clinical Narrative:   CSW spoke with nephew to discuss SNF choices. Nephew discussed difficulty in getting to the patient to visit if he were in [place], likely still wants to stick with [facility] but would like to discuss in person with patient tomorrow morning. CSW discussed that patient is medically stable for SNF, will go ahead and initiate insurance authorization request today as that can take time. CSW spoke with [facility], they still have a bed available for patient. CSW requested for CMA to initiate insurance authorization request. Patient will need PTAR for transportation.    Expected Discharge Plan: Skilled Nursing Facility    Expected Discharge Plan and Services                                               Social Determinants of Health (SDOH) Interventions SDOH Screenings   Food Insecurity: No Food Insecurity (06/04/2023)  Housing: Patient Declined (06/04/2023)  Transportation Needs: No Transportation Needs (06/04/2023)  Utilities: At Risk (06/04/2023)  Alcohol Screen: Low Risk  (02/26/2022)  Depression (PHQ2-9): Medium Risk (02/26/2022)  Financial Resource Strain: Low Risk  (10/22/2021)  Physical Activity: Inactive (03/25/2020)  Social Connections: Socially Isolated (03/25/2020)  Stress: No Stress Concern Present (03/25/2020)  Tobacco Use: Low Risk  (06/04/2023)    Readmission Risk Interventions     No data to display

## 2023-06-10 NOTE — Progress Notes (Signed)
PROGRESS NOTE    Clarence Hamilton  ZOX:[id number] DOB: [birth year] DOA: 06/02/2023 PCP: Malva Limes, MD    Brief Narrative:   Clarence Hamilton is a 67 y.o. male with past medical history significant for mild persistent asthma/COPD, intolerance to steroids, HTN, HLD, prediabetes, lumbar radiculopathy, chronic pain syndrome, anxiety/depression who presented to [facility] on May 29, 2023 with confusion/altered mental status, lethargy in which she was having one-word answers.  Initial labs notable for hyponatremia with a sodium of 125, potassium 2.0, leukocytosis 19.6.  Platelet count 725 pending urine drug screen was positive for opiates, benzos and THC.  Patient was started on broad-spectrum antibiotics admitted to the hospital for possible sepsis secondary to urinary tract infection.  Despite antibiotic treatment, patient continued to have altered mentation and an episode of seizure-like activity.  MR brain was negative but due to recurrent seizure-like activity patient was transferred to [facility] for neurology evaluation.  During hospitalization, patient was seen by psychiatry for concern of benzodiazepine intoxication.  Xanax was discontinued and transition to long-acting Valium.  Psychiatry has signed off with recommendation for gradual taper.  Currently awaiting skilled nursing facility placement, medically stable for discharge once bed available.  Assessment & Plan:   Sepsis secondary to E. coli UTI Diagnosed during admission at [facility], CT scan of the chest/abdomen/pelvis was unrevealing.  Lyme disease serology negative.  Blood cultures x 2 negative x 5 days.  Urine culture with pansensitive E. coli.  Patient completed course of antibiotics.  Leukocytosis has resolved.  Concern for benzodiazepine intoxication Acute toxic/metabolic encephalopathy Anxiety/depression Patient was transferred to [facility] for neurology evaluation given persistent  encephalopathy.  Patient was treated for sepsis secondary to UTI as above also with concern for benzodiazepine intoxication with excessive Xanax use outpatient.  Patient had seizure-like activity, MRI brain negative for acute findings.  Additionally patient on gabapentin, Flexeril at home likely contributing factor.  Long-term EEG negative for seizures.  TSH within normal limits.  Was seen by neurology as well during hospitalization.  Psychiatry was consulted with recommendations of transitioning Xanax to Valium 15 mg p.o. 3 times daily; with recommendation for decreased dose 10 to 20% monthly as tolerates outpatient.  Encephalopathy has resolved and patient's mental status now at baseline.  Anemia No report of blood loss, blood in stool.  Hemoglobin 9.3, stable.  Hypokalemia Hypomagnesia Repleted.  Candidal intertrigo --Nystatin powder  Essential hypertension -- Cardizem CD100 20 mg p.o. daily -- Hydrochlorothiazide 12.5 mg p.o. daily. -- Losartan 100 mg p.o. daily  Hyperlipidemia -- Crestor 10 mg p.o. daily  Right eye conjunctivitis Will complete 7-day course of Cipro eyedrops --Refresh Tears as needed  COPD/asthma --Singulair 10 mg p.o. nightly -- Albuterol neb every 4 hours.  Wheezing/shortness of breath  Obesity Body mass index is 32.25 kg/m.  Complicates all facets of care  Debility/deconditioning, generalized weakness, gait disturbance Seen by PT/OT with recommendation of SNF placement. --TOC following -- Continue therapy efforts while inpatient   DVT prophylaxis: enoxaparin (LOVENOX) injection 40 mg Start: 06/03/23 0800 SCDs Start: 06/02/23 2138    Code Status: Full Code Family Communication: No family present at bedside this morning  Disposition Plan:  Level of care: Med-Surg Status is: Inpatient Remains inpatient appropriate because: Medically stable for discharge to SNF once bed available    Consultants:  Neurology Psychiatry  Procedures:   EEG  Antimicrobials:  Vancomycin 8/4 - 8/4 Metronidazole 8/4 - 8/4 Cefepime 8/4 - 8/4 Ceftriaxone 8/4 - 8/6  Subjective: Patient seen examined bedside, lying in bed.  Only complaint is that "bright lights cause eye pain".  Mentation remains at baseline.  Pending SNF placement, desires [place] area.  Discussed with social work this morning.  Medically stable for discharge once bed available.  Denies headache, no fever/chills/night sweats, no nausea/vomiting/diarrhea, no chest pain, no palpitations, no abdominal pain, no focal weakness, no fatigue, no paresthesias.  No acute events overnight per nurse staff.  Objective: Vitals:   06/09/23 2008 06/09/23 2333 06/10/23 0422 06/10/23 0809  BP: 131/73 137/68 134/74 (!) 141/78  Pulse: 80 76 65 74  Resp: 18     Temp: 100 F (37.8 C) 99.6 F (37.6 C) 98.7 F (37.1 C) 98.8 F (37.1 C)  TempSrc: Oral Oral Oral Oral  SpO2: 97% 96% 95% 97%  Weight:      Height:        Intake/Output Summary (Last 24 hours) at 06/10/2023 1202 Last data filed at 06/10/2023 1610 Gross per 24 hour  Intake --  Output 4000 ml  Net -4000 ml   Filed Weights   06/07/23 1107  Weight: 96.2 kg    Examination:  Physical Exam: GEN: NAD, alert and oriented x 3, obese, chronically ill in appearance, appears older than stated age HEENT: NCAT, PERRL, EOMI, sclera clear, MMM PULM: CTAB w/o wheezes/crackles, normal respiratory effort, on room air CV: RRR w/o M/G/R GI: abd soft, NTND, NABS, no R/G/M MSK: no peripheral edema, moves all extremities independently NEURO: CN II-XII intact, no focal deficits, sensation to light touch intact PSYCH: normal mood/affect    Data Reviewed: I have personally reviewed following labs and imaging studies  CBC: Recent Labs  Lab 06/04/23 0308 06/06/23 0159 06/08/23 0307 06/09/23 0803  WBC 13.7* 8.7 8.5 7.7  HGB 8.3* 8.5* 8.4* 9.3*  HCT 24.5* 25.0* 25.4* 28.1*  MCV 92.1 89.9 89.4 90.6  PLT 457* 383 353 318   Basic  Metabolic Panel: Recent Labs  Lab 06/04/23 0308 06/06/23 0159 06/08/23 0307 06/09/23 0803  NA 133* 137 136 136  K 3.2* 3.4* 3.5 3.9  CL 99 101 102 100  CO2 26 27 24 26   GLUCOSE 96 114* 107* 97  BUN 20 21 23 22   CREATININE 1.16 1.68* 1.46* 1.56*  CALCIUM 8.3* 8.3* 8.7* 8.8*  MG 1.7 1.6* 2.0 2.1   GFR: Estimated Creatinine Clearance: 53.1 mL/min (A) (by C-G formula based on SCr of 1.56 mg/dL (H)). Liver Function Tests: No results for input(s): "AST", "ALT", "ALKPHOS", "BILITOT", "PROT", "ALBUMIN" in the last 168 hours. No results for input(s): "LIPASE", "AMYLASE" in the last 168 hours. No results for input(s): "AMMONIA" in the last 168 hours. Coagulation Profile: No results for input(s): "INR", "PROTIME" in the last 168 hours. Cardiac Enzymes: No results for input(s): "CKTOTAL", "CKMB", "CKMBINDEX", "TROPONINI" in the last 168 hours. BNP (last 3 results) No results for input(s): "PROBNP" in the last 8760 hours. HbA1C: No results for input(s): "HGBA1C" in the last 72 hours. CBG: Recent Labs  Lab 06/09/23 1214 06/09/23 1634 06/09/23 2126 06/10/23 0615 06/10/23 1135  GLUCAP 197* 98 125* 119* 97   Lipid Profile: No results for input(s): "CHOL", "HDL", "LDLCALC", "TRIG", "CHOLHDL", "LDLDIRECT" in the last 72 hours. Thyroid Function Tests: No results for input(s): "TSH", "T4TOTAL", "FREET4", "T3FREE", "THYROIDAB" in the last 72 hours. Anemia Panel: No results for input(s): "VITAMINB12", "FOLATE", "FERRITIN", "TIBC", "IRON", "RETICCTPCT" in the last 72 hours. Sepsis Labs: No results for input(s): "PROCALCITON", "LATICACIDVEN" in the last 168 hours.  Recent  Results (from the past 240 hour(s))  Culture, blood (Routine X 2) w Reflex to ID Panel     Status: None   Collection Time: 06/03/23  5:32 AM   Specimen: BLOOD LEFT ARM  Result Value Ref Range Status   Specimen Description BLOOD LEFT ARM  Final   Special Requests   Final    BOTTLES DRAWN AEROBIC AND ANAEROBIC Blood  Culture results may not be optimal due to an inadequate volume of blood received in culture bottles   Culture   Final    NO GROWTH 5 DAYS Performed at [facility], [address], [place], [place] [address]    Report Status 06/08/2023 FINAL  Final  Culture, blood (Routine X 2) w Reflex to ID Panel     Status: None   Collection Time: 06/03/23  5:32 AM   Specimen: BLOOD LEFT ARM  Result Value Ref Range Status   Specimen Description BLOOD LEFT ARM  Final   Special Requests   Final    BOTTLES DRAWN AEROBIC AND ANAEROBIC Blood Culture adequate volume   Culture   Final    NO GROWTH 5 DAYS Performed at [facility], [address], [place], [place] [address]    Report Status 06/08/2023 FINAL  Final         Radiology Studies: No results found.      Scheduled Meds:  azelastine  1 spray Each Nare BID   ciprofloxacin  1 drop Both Eyes Q4H while awake   diazepam  15 mg Oral TID   diltiazem  120 mg Oral Daily   enoxaparin (LOVENOX) injection  40 mg Subcutaneous Q24H   feeding supplement  1 Container Oral TID BM   fluticasone  1 spray Each Nare Daily   hydrochlorothiazide  12.5 mg Oral Daily   insulin aspart  0-15 Units Subcutaneous TID WC   insulin aspart  0-5 Units Subcutaneous QHS   loratadine  10 mg Oral Daily   losartan  100 mg Oral Daily   magnesium oxide  400 mg Oral BID   montelukast  10 mg Oral QHS   multivitamin with minerals  1 tablet Oral Daily   naphazoline-glycerin  1-2 drop Both Eyes Q4H   nystatin   Topical BID   rosuvastatin  20 mg Oral Daily   sodium chloride flush  3 mL Intravenous Q12H   Continuous Infusions:   LOS: 8 days    Time spent: 51 minutes spent on chart review, discussion with nursing staff, consultants, updating family and interview/physical exam; more than 50% of that time was spent in counseling and/or coordination of care.    Alvira Philips [place], DO [facility] Available via Epic secure chat 7am-7pm After these hours,  please refer to coverage provider listed on [email or web address] 06/10/2023, 12:02 PM

## 2023-06-10 NOTE — TOC Progression Note (Signed)
Transition of Care [facility]) - Progression Note    Patient Details  Name: Clarence Hamilton MRN: [id number] Date of Birth: 04/29/1956  Transition of Care [facility]) CM/SW Contact  Baldemar Lenis, [place] Phone Number: 06/10/2023, 10:45 AM  Clinical Narrative:   CSW spoke with nephew, Nida Boatman, to provide bed offers for [facility]. Nephew only familiar with [facility], but had not heard good things about it. CSW texted names of SNF to nephew to review, nephew will update CSW with choice after review.    Expected Discharge Plan: Skilled Nursing Facility    Expected Discharge Plan and Services                                               Social Determinants of Health (SDOH) Interventions SDOH Screenings   Food Insecurity: No Food Insecurity (06/04/2023)  Housing: Patient Declined (06/04/2023)  Transportation Needs: No Transportation Needs (06/04/2023)  Utilities: At Risk (06/04/2023)  Alcohol Screen: Low Risk  (02/26/2022)  Depression (PHQ2-9): Medium Risk (02/26/2022)  Financial Resource Strain: Low Risk  (10/22/2021)  Physical Activity: Inactive (03/25/2020)  Social Connections: Socially Isolated (03/25/2020)  Stress: No Stress Concern Present (03/25/2020)  Tobacco Use: Low Risk  (06/04/2023)    Readmission Risk Interventions     No data to display

## 2023-06-10 NOTE — Progress Notes (Signed)
Physical Therapy Treatment Patient Details Name: Clarence Hamilton MRN: [id number] DOB: April 01, 1956 Today's Date: 06/10/2023   History of Present Illness Pt is a 67 yo male that presented to [facility] on 8/4 for AMS. Workup for acute toxic metabolic encephalopathy, sepsis secondary to UTI, seizure like activity, AKI. Urine drug screen positive for opiates benzos and cannabis. Pt transferred to [facility] for continuous EEG on 8/8. PMH of asthma/COPD, HTN, prediabetes, HLD, chronic pain syndrome, anxiety, depression.    PT Comments  Progressing well towards functional goals. Mobilizing overall at [facility] level with reliance on RW for support. Cues for gait symmetry and techniques with bed mobility/transfers. Reviewed HEP. Tolerated well however slow and antalgic movements today. Patient will continue to benefit from skilled physical therapy services to further improve independence with functional mobility.     If plan is discharge home, recommend the following: A little help with walking and/or transfers;A little help with bathing/dressing/bathroom;Assistance with cooking/housework;Direct supervision/assist for medications management;Direct supervision/assist for financial management;Help with stairs or ramp for entrance;Assist for transportation   Can travel by private vehicle     Yes  Equipment Recommendations  Rolling walker (2 wheels) (vs rollator)    Recommendations for Other Services       Precautions / Restrictions Precautions Precautions: Fall Restrictions Weight Bearing Restrictions: No     Mobility  Bed Mobility Overal bed mobility: Needs Assistance Bed Mobility: Rolling, Sit to Sidelying, Sidelying to Sit Rolling: Supervision Sidelying to sit: Contact guard assist     Sit to sidelying: Contact guard assist General bed mobility comments: Supervision to roll. CGA for transition to and from sidelying/sit. Effortful but gradually improving. Cues for sequencing.    Transfers Overall  transfer level: Needs assistance Equipment used: Rolling walker (2 wheels) Transfers: Sit to/from Stand Sit to Stand: Contact guard assist           General transfer comment: CGA from low bed surface. Cues for set up and hand placement. RW for support upon rising, stable. Slow, antalgic    Ambulation/Gait Ambulation/Gait assistance: Contact guard assist Gait Distance (Feet): 110 Feet Assistive device: Rolling walker (2 wheels) Gait Pattern/deviations: Step-through pattern, Decreased step length - right, Decreased step length - left, Decreased stride length, Shuffle, Trunk flexed, Wide base of support Gait velocity: decr Gait velocity interpretation: <1.31 ft/sec, indicative of household ambulator   General Gait Details: Further cues today for gait symmetry and posture. CGA for safety, no overt buckling. Antalgic pattern, fair reliance on RW to reduce pressure on knees and feet which he reports as uncomfortable.   Stairs             Wheelchair Mobility     Tilt Bed    Modified Rankin (Stroke Patients Only)       Balance Overall balance assessment: Needs assistance Sitting-balance support: No upper extremity supported, Feet supported Sitting balance-Leahy Scale: Good     Standing balance support: During functional activity, No upper extremity supported Standing balance-Leahy Scale: Fair Standing balance comment: Stands with CGA no UE Suppoert, improved stability with support..                            Cognition Arousal: Alert Behavior During Therapy: WFL for tasks assessed/performed Overall Cognitive Status: No family/caregiver present to determine baseline cognitive functioning  General Comments: hyperverbose, residrection for task initiation and continuation        Exercises General Exercises - Lower Extremity Ankle Circles/Pumps: AROM, Both, 10 reps, Supine Quad Sets: Strengthening, Both, 10  reps, Supine Gluteal Sets: Strengthening, Both, 10 reps, Supine Hip ABduction/ADduction: Strengthening, Both, 10 reps, Supine    General Comments        Pertinent Vitals/Pain Pain Assessment Pain Assessment: Faces Faces Pain Scale: Hurts little more Pain Location: back Pain Descriptors / Indicators: Aching Pain Intervention(s): Monitored during session, Repositioned    Home Living                          Prior Function            PT Goals (current goals can now be found in the care plan section) Acute Rehab PT Goals Patient Stated Goal: To redecorate his house PT Goal Formulation: With patient Time For Goal Achievement: 06/20/23 Potential to Achieve Goals: Good Progress towards PT goals: Progressing toward goals    Frequency    Min 1X/week      PT Plan      Co-evaluation              AM-PAC PT "6 Clicks" Mobility   Outcome Measure  Help needed turning from your back to your side while in a flat bed without using bedrails?: A Little Help needed moving from lying on your back to sitting on the side of a flat bed without using bedrails?: A Little Help needed moving to and from a bed to a chair (including a wheelchair)?: A Little Help needed standing up from a chair using your arms (e.g., wheelchair or bedside chair)?: A Little Help needed to walk in hospital room?: A Little Help needed climbing 3-5 steps with a railing? : A Lot 6 Click Score: 17    End of Session Equipment Utilized During Treatment: Gait belt Activity Tolerance: Patient tolerated treatment well Patient left: with call bell/phone within reach;in bed;with bed alarm set;with SCD's reapplied Nurse Communication: Mobility status PT Visit Diagnosis: Unsteadiness on feet (R26.81);Other abnormalities of gait and mobility (R26.89);Muscle weakness (generalized) (M62.81)     Time: 1610-9604 PT Time Calculation (min) (ACUTE ONLY): 24 min  Charges:    $Gait Training: 8-22  mins $Therapeutic Activity: 8-22 mins PT General Charges $$ ACUTE PT VISIT: 1 Visit                     Kathlyn [place], PT, DPT [facility]  Rehabilitation Services Physical Therapist Office: [phone] Website: [email or web address]    Berton Mount 06/10/2023, 2:21 PM

## 2023-06-11 DIAGNOSIS — G929 Unspecified toxic encephalopathy: Secondary | ICD-10-CM | POA: Diagnosis not present

## 2023-06-11 DIAGNOSIS — N390 Urinary tract infection, site not specified: Secondary | ICD-10-CM | POA: Diagnosis not present

## 2023-06-11 DIAGNOSIS — B9620 Unspecified Escherichia coli [E. coli] as the cause of diseases classified elsewhere: Secondary | ICD-10-CM | POA: Diagnosis not present

## 2023-06-11 LAB — GLUCOSE, CAPILLARY
Glucose-Capillary: 109 mg/dL — ABNORMAL HIGH (ref 70–99)
Glucose-Capillary: 140 mg/dL — ABNORMAL HIGH (ref 70–99)
Glucose-Capillary: 98 mg/dL (ref 70–99)
Glucose-Capillary: 96 mg/dL (ref 70–99)

## 2023-06-11 MED ORDER — ORAL CARE MOUTH RINSE: 15.0000 mL | OROMUCOSAL | Status: DC | PRN

## 2023-06-11 NOTE — Plan of Care (Signed)
  Problem: Education: Goal: Knowledge of General Education information will improve Description: Including pain rating scale, medication(s)/side effects and non-pharmacologic comfort measures Outcome: Progressing   Problem: Clinical Measurements: Goal: Respiratory complications will improve Outcome: Progressing Goal: Cardiovascular complication will be avoided Outcome: Progressing   Problem: Activity: Goal: Risk for activity intolerance will decrease Outcome: Progressing   Problem: Nutrition: Goal: Adequate nutrition will be maintained Outcome: Progressing   Problem: Coping: Goal: Level of anxiety will decrease Outcome: Progressing   Problem: Elimination: Goal: Will not experience complications related to urinary retention Outcome: Progressing

## 2023-06-11 NOTE — TOC Progression Note (Signed)
Transition of Care [facility]) - Progression Note    Patient Details  Name: Clarence Hamilton MRN: [id number] Date of Birth: Apr 22, 1956  [facility]) CM/SW Contact  Dellie Burns [place], [place] Phone Number: 06/11/2023, 4:02 PM  Clinical Narrative:  per [facility], Berkley Harvey is still pending at this time and has not been assigned to a pre-service coordinator yet. Anticipate determination by Monday. Will provide updates as available.   Dellie Burns, MSW, LCSW [phone] (coverage)       Expected Discharge Plan: Skilled Nursing Facility    Expected Discharge Plan and Services                                               Social Determinants of Health (SDOH) Interventions SDOH Screenings   Food Insecurity: No Food Insecurity (06/04/2023)  Housing: Patient Declined (06/04/2023)  Transportation Needs: No Transportation Needs (06/04/2023)  Utilities: At Risk (06/04/2023)  Alcohol Screen: Low Risk  (02/26/2022)  Depression (PHQ2-9): Medium Risk (02/26/2022)  Financial Resource Strain: Low Risk  (10/22/2021)  Physical Activity: Inactive (03/25/2020)  Social Connections: Socially Isolated (03/25/2020)  Stress: No Stress Concern Present (03/25/2020)  Tobacco Use: Low Risk  (06/04/2023)    Readmission Risk Interventions     No data to display

## 2023-06-11 NOTE — Progress Notes (Signed)
PROGRESS NOTE    Clarence Hamilton  GLO:[id number] DOB: [birth year] DOA: 06/02/2023 PCP: Malva Limes, MD    Brief Narrative:   Clarence Hamilton is a 67 y.o. male with past medical history significant for mild persistent asthma/COPD, intolerance to steroids, HTN, HLD, prediabetes, lumbar radiculopathy, chronic pain syndrome, anxiety/depression who presented to [facility] initially on May 29, 2023 with confusion/altered mental status, lethargy in which she was having one-word answers.  Initial labs notable for hyponatremia with a sodium of 125, potassium 2.0, leukocytosis 19.6.  Platelet count 725 pending urine drug screen was positive for opiates, benzos and THC.  Patient was started on broad-spectrum antibiotics admitted to the hospital for possible sepsis secondary to urinary tract infection.  Despite antibiotic treatment, patient continued to have altered mentation and an episode of seizure-like activity.  MR brain was negative but due to recurrent seizure-like activity patient was transferred to [facility] for neurology evaluation.  During hospitalization, patient was seen by psychiatry for concern of benzodiazepine intoxication.  Xanax was discontinued and transition to long-acting Valium.  Psychiatry has signed off with recommendation for gradual taper.  Currently awaiting skilled nursing facility placement, medically stable for discharge once bed available.  Assessment & Plan:   Sepsis secondary to E. coli UTI Diagnosed during admission at [facility], CT scan of the chest/abdomen/pelvis was unrevealing.  Lyme disease serology negative.  Blood cultures x 2 negative x 5 days.  Urine culture with pansensitive E. coli.  Patient completed course of antibiotics.  Leukocytosis has resolved.  Concern for benzodiazepine intoxication Acute toxic/metabolic encephalopathy Anxiety/depression Patient was transferred to [facility] for neurology evaluation given persistent  encephalopathy.  Patient was treated for sepsis secondary to UTI as above also with concern for benzodiazepine intoxication with excessive Xanax use outpatient.  Patient had seizure-like activity, MRI brain negative for acute findings.  Additionally patient on gabapentin, Flexeril at home likely contributing factor.  Long-term EEG negative for seizures.  TSH within normal limits.  Was seen by neurology as well during hospitalization.  Psychiatry was consulted with recommendations of transitioning Xanax to Valium 15 mg p.o. 3 times daily; with recommendation for decreased dose 10 to 20% monthly as tolerates outpatient.  Encephalopathy has resolved and patient's mental status now at baseline.  Anemia No report of blood loss, blood in stool.  Hemoglobin 9.3, stable.  Hypokalemia Hypomagnesia Repleted.  Candidal intertrigo --Nystatin powder  Essential hypertension -- Cardizem CD100 20 mg p.o. daily -- Hydrochlorothiazide 12.5 mg p.o. daily. -- Losartan 100 mg p.o. daily  Hyperlipidemia -- Crestor 10 mg p.o. daily  Right eye conjunctivitis Completed 7-day course of Cipro eyedrops. --Refresh Tears as needed  COPD/asthma --Singulair 10 mg p.o. nightly -- Albuterol neb every 4 hours.  Wheezing/shortness of breath  Obesity Body mass index is 32.25 kg/m.  Complicates all facets of care  Debility/deconditioning, generalized weakness, gait disturbance Seen by PT/OT with recommendation of SNF placement. --TOC following -- Continue therapy efforts while inpatient   DVT prophylaxis: enoxaparin (LOVENOX) injection 40 mg Start: 06/03/23 0800 SCDs Start: 06/02/23 2138    Code Status: Full Code Family Communication: No family present at bedside this morning  Disposition Plan:  Level of care: Med-Surg Status is: Inpatient Remains inpatient appropriate because: Medically stable for discharge to SNF once bed available; pending insurance authorization [facility]    Consultants:   Neurology Psychiatry  Procedures:  EEG  Antimicrobials:  Vancomycin 8/4 - 8/4 Metronidazole 8/4 - 8/4 Cefepime 8/4 - 8/4  Ceftriaxone 8/4 - 8/6   Subjective: Patient seen examined bedside, lying in bed.  Patient reports feeling "emotional" while watching TV.  Continues with eye irritation to bright light.  Asking if he would be able to go to an optometrist outpatient well at SNF.  Pending insurance authorization for [facility], medically stable for discharge once bed available.  Denies headache, no fever/chills/night sweats, no nausea/vomiting/diarrhea, no chest pain, no palpitations, no abdominal pain, no focal weakness, no fatigue, no paresthesias.  No acute events overnight per nursing staff.  Objective: Vitals:   06/10/23 2305 06/11/23 0401 06/11/23 0807 06/11/23 1146  BP: 101/89 130/76 125/83 130/76  Pulse: 80 71 75 79  Resp: 18 18 18 17   Temp: 99.8 F (37.7 C) 98.5 F (36.9 C) 98.6 F (37 C) 99.2 F (37.3 C)  TempSrc: Oral Oral Oral Oral  SpO2: 97% 97% 99% 100%  Weight:      Height:        Intake/Output Summary (Last 24 hours) at 06/11/2023 1148 Last data filed at 06/11/2023 0417 Gross per 24 hour  Intake --  Output 4400 ml  Net -4400 ml   Filed Weights   06/07/23 1107  Weight: 96.2 kg    Examination:  Physical Exam: GEN: NAD, alert and oriented x 3, obese, chronically ill in appearance, appears older than stated age HEENT: NCAT, PERRL, EOMI, sclera clear, MMM PULM: CTAB w/o wheezes/crackles, normal respiratory effort, on room air CV: RRR w/o M/G/R GI: abd soft, NTND, NABS, no R/G/M MSK: no peripheral edema, moves all extremities independently NEURO: CN II-XII intact, no focal deficits, sensation to light touch intact PSYCH: Depressed mood, flat affect    Data Reviewed: I have personally reviewed following labs and imaging studies  CBC: Recent Labs  Lab 06/06/23 0159 06/08/23 0307 06/09/23 0803  WBC 8.7 8.5 7.7  HGB 8.5* 8.4* 9.3*   HCT 25.0* 25.4* 28.1*  MCV 89.9 89.4 90.6  PLT 383 353 318   Basic Metabolic Panel: Recent Labs  Lab 06/06/23 0159 06/08/23 0307 06/09/23 0803  NA 137 136 136  K 3.4* 3.5 3.9  CL 101 102 100  CO2 27 24 26   GLUCOSE 114* 107* 97  BUN 21 23 22   CREATININE 1.68* 1.46* 1.56*  CALCIUM 8.3* 8.7* 8.8*  MG 1.6* 2.0 2.1   GFR: Estimated Creatinine Clearance: 53.1 mL/min (A) (by C-G formula based on SCr of 1.56 mg/dL (H)). Liver Function Tests: No results for input(s): "AST", "ALT", "ALKPHOS", "BILITOT", "PROT", "ALBUMIN" in the last 168 hours. No results for input(s): "LIPASE", "AMYLASE" in the last 168 hours. No results for input(s): "AMMONIA" in the last 168 hours. Coagulation Profile: No results for input(s): "INR", "PROTIME" in the last 168 hours. Cardiac Enzymes: No results for input(s): "CKTOTAL", "CKMB", "CKMBINDEX", "TROPONINI" in the last 168 hours. BNP (last 3 results) No results for input(s): "PROBNP" in the last 8760 hours. HbA1C: No results for input(s): "HGBA1C" in the last 72 hours. CBG: Recent Labs  Lab 06/10/23 0615 06/10/23 1135 06/10/23 1643 06/10/23 2102 06/11/23 0609  GLUCAP 119* 97 205* 104* 109*   Lipid Profile: No results for input(s): "CHOL", "HDL", "LDLCALC", "TRIG", "CHOLHDL", "LDLDIRECT" in the last 72 hours. Thyroid Function Tests: No results for input(s): "TSH", "T4TOTAL", "FREET4", "T3FREE", "THYROIDAB" in the last 72 hours. Anemia Panel: No results for input(s): "VITAMINB12", "FOLATE", "FERRITIN", "TIBC", "IRON", "RETICCTPCT" in the last 72 hours. Sepsis Labs: No results for input(s): "PROCALCITON", "LATICACIDVEN" in the last 168 hours.  Recent Results (  from the past 240 hour(s))  Culture, blood (Routine X 2) w Reflex to ID Panel     Status: None   Collection Time: 06/03/23  5:32 AM   Specimen: BLOOD LEFT ARM  Result Value Ref Range Status   Specimen Description BLOOD LEFT ARM  Final   Special Requests   Final    BOTTLES DRAWN  AEROBIC AND ANAEROBIC Blood Culture results may not be optimal due to an inadequate volume of blood received in culture bottles   Culture   Final    NO GROWTH 5 DAYS Performed at [facility], [address], [place], [place] [address]    Report Status 06/08/2023 FINAL  Final  Culture, blood (Routine X 2) w Reflex to ID Panel     Status: None   Collection Time: 06/03/23  5:32 AM   Specimen: BLOOD LEFT ARM  Result Value Ref Range Status   Specimen Description BLOOD LEFT ARM  Final   Special Requests   Final    BOTTLES DRAWN AEROBIC AND ANAEROBIC Blood Culture adequate volume   Culture   Final    NO GROWTH 5 DAYS Performed at [facility], [address] [place], [place] [address]    Report Status 06/08/2023 FINAL  Final         Radiology Studies: No results found.      Scheduled Meds:  azelastine  1 spray Each Nare BID   diazepam  15 mg Oral TID   diltiazem  120 mg Oral Daily   enoxaparin (LOVENOX) injection  40 mg Subcutaneous Q24H   feeding supplement  1 Container Oral TID BM   fluticasone  1 spray Each Nare Daily   hydrochlorothiazide  12.5 mg Oral Daily   insulin aspart  0-15 Units Subcutaneous TID WC   insulin aspart  0-5 Units Subcutaneous QHS   loratadine  10 mg Oral Daily   losartan  100 mg Oral Daily   magnesium oxide  400 mg Oral BID   montelukast  10 mg Oral QHS   multivitamin with minerals  1 tablet Oral Daily   naphazoline-glycerin  1-2 drop Both Eyes Q4H   nystatin   Topical BID   rosuvastatin  20 mg Oral Daily   sodium chloride flush  3 mL Intravenous Q12H   Continuous Infusions:   LOS: 9 days    Time spent: 44 minutes spent on chart review, discussion with nursing staff, consultants, updating family and interview/physical exam; more than 50% of that time was spent in counseling and/or coordination of care.    Alvira Philips [place], DO [facility] Available via Epic secure chat 7am-7pm After these hours, please refer to  coverage provider listed on [email or web address] 06/11/2023, 11:48 AM

## 2023-06-11 NOTE — Progress Notes (Signed)
Patient searching for belongings to send home with nephew, on admission patient stated they were locked up. However patient was admit from [facility] their ED and patients belongings were in their locker. Patient made aware and nephew called to make aware and stated that he would go pick them up.

## 2023-06-11 NOTE — Plan of Care (Signed)
  Problem: Education: Goal: Knowledge of General Education information will improve Description: Including pain rating scale, medication(s)/side effects and non-pharmacologic comfort measures 06/11/2023 1951 by Tharon Aquas, RN Outcome: Progressing 06/11/2023 1649 by Tharon Aquas, RN Outcome: Progressing   Problem: Clinical Measurements: Goal: Respiratory complications will improve 06/11/2023 1951 by Tharon Aquas, RN Outcome: Progressing 06/11/2023 1649 by Tharon Aquas, RN Outcome: Progressing Goal: Cardiovascular complication will be avoided Outcome: Progressing   Problem: Activity: Goal: Risk for activity intolerance will decrease 06/11/2023 1951 by Tharon Aquas, RN Outcome: Progressing 06/11/2023 1649 by Tharon Aquas, RN Outcome: Progressing   Problem: Nutrition: Goal: Adequate nutrition will be maintained Outcome: Progressing   Problem: Coping: Goal: Level of anxiety will decrease Outcome: Progressing   Problem: Elimination: Goal: Will not experience complications related to bowel motility Outcome: Progressing Goal: Will not experience complications related to urinary retention 06/11/2023 1951 by Tharon Aquas, RN Outcome: Progressing 06/11/2023 1649 by Tharon Aquas, RN Outcome: Progressing   Problem: Pain Managment: Goal: General experience of comfort will improve Outcome: Progressing   Problem: Skin Integrity: Goal: Risk for impaired skin integrity will decrease Outcome: Progressing

## 2023-06-12 DIAGNOSIS — G929 Unspecified toxic encephalopathy: Secondary | ICD-10-CM | POA: Diagnosis not present

## 2023-06-12 LAB — BASIC METABOLIC PANEL
Anion gap: 14 (ref 5–15)
BUN: 26 mg/dL — ABNORMAL HIGH (ref 8–23)
CO2: 24 mmol/L (ref 22–32)
Calcium: 8.8 mg/dL — ABNORMAL LOW (ref 8.9–10.3)
Chloride: 98 mmol/L (ref 98–111)
Creatinine, Ser: 1.68 mg/dL — ABNORMAL HIGH (ref 0.61–1.24)
GFR, Estimated: 45 mL/min — ABNORMAL LOW (ref >60)
Glucose, Bld: 112 mg/dL — ABNORMAL HIGH (ref 70–99)
Potassium: 3.5 mmol/L (ref 3.5–5.1)
Sodium: 136 mmol/L (ref 135–145)

## 2023-06-12 LAB — GLUCOSE, CAPILLARY
Glucose-Capillary: 113 mg/dL — ABNORMAL HIGH (ref 70–99)
Glucose-Capillary: 130 mg/dL — ABNORMAL HIGH (ref 70–99)
Glucose-Capillary: 120 mg/dL — ABNORMAL HIGH (ref 70–99)
Glucose-Capillary: 117 mg/dL — ABNORMAL HIGH (ref 70–99)

## 2023-06-12 NOTE — Plan of Care (Signed)

## 2023-06-12 NOTE — Progress Notes (Signed)
PROGRESS NOTE    Clarence Hamilton  ZOX:[id number] DOB: 12/28/1955 DOA: 06/02/2023 PCP: Malva Limes, MD    Brief Narrative:   Clarence Hamilton is a 67 y.o. male with past medical history significant for mild persistent asthma/COPD, intolerance to steroids, HTN, HLD, prediabetes, lumbar radiculopathy, chronic pain syndrome, anxiety/depression who presented to [facility] initially on May 29, 2023 with confusion/altered mental status, lethargy in which she was having one-word answers.  Initial labs notable for hyponatremia with a sodium of 125, potassium 2.0, leukocytosis 19.6.  Platelet count 725 pending urine drug screen was positive for opiates, benzos and THC.  Patient was started on broad-spectrum antibiotics admitted to the hospital for possible sepsis secondary to urinary tract infection.  Despite antibiotic treatment, patient continued to have altered mentation and an episode of seizure-like activity.  MR brain was negative but due to recurrent seizure-like activity patient was transferred to [facility] for neurology evaluation.  During hospitalization, patient was seen by psychiatry for concern of benzodiazepine intoxication.  Xanax was discontinued and transition to long-acting Valium.  Psychiatry has signed off with recommendation for gradual taper.  Currently awaiting skilled nursing facility placement, medically stable for discharge once bed available.  Assessment & Plan:   Sepsis secondary to E. coli UTI Diagnosed during admission at [facility], CT scan of the chest/abdomen/pelvis was unrevealing.  Lyme disease serology negative.  Blood cultures x 2 negative x 5 days.  Urine culture with pansensitive E. coli.  Patient completed course of antibiotics.  Leukocytosis has resolved.  Concern for benzodiazepine intoxication Acute toxic/metabolic encephalopathy Anxiety/depression Patient was transferred to [facility] for neurology evaluation given persistent  encephalopathy.  Patient was treated for sepsis secondary to UTI as above also with concern for benzodiazepine intoxication with excessive Xanax use outpatient.  Patient had seizure-like activity, MRI brain negative for acute findings.  Additionally patient on gabapentin, Flexeril at home likely contributing factor.  Long-term EEG negative for seizures.  TSH within normal limits.  Was seen by neurology as well during hospitalization.  Psychiatry was consulted with recommendations of transitioning Xanax to Valium 15 mg p.o. 3 times daily; with recommendation for decreased dose 10 to 20% monthly as tolerates outpatient.  Encephalopathy has resolved and patient's mental status now at baseline.  Anemia No report of blood loss, blood in stool.  Hemoglobin 9.3, stable.  Hypokalemia Hypomagnesia Repleted.  Candidal intertrigo --Nystatin powder  Essential hypertension -- Cardizem CD 120 mg p.o. daily -- Hydrochlorothiazide 12.5 mg p.o. daily. -- Losartan 100 mg p.o. daily  Hyperlipidemia -- Crestor 10 mg p.o. daily  Right eye conjunctivitis Completed 7-day course of Cipro eyedrops. --Refresh Tears as needed  COPD/asthma --Singulair 10 mg p.o. nightly -- Albuterol neb every 4 hours.  Wheezing/shortness of breath  Obesity Body mass index is 32.25 kg/m.  Complicates all facets of care  Debility/deconditioning, generalized weakness, gait disturbance Seen by PT/OT with recommendation of SNF placement. --TOC following -- Continue therapy efforts while inpatient   DVT prophylaxis: enoxaparin (LOVENOX) injection 40 mg Start: 06/03/23 0800 SCDs Start: 06/02/23 2138    Code Status: Full Code Family Communication: No family present at bedside this morning  Disposition Plan:  Level of care: Med-Surg Status is: Inpatient Remains inpatient appropriate because: Medically stable for discharge to SNF once bed available; pending insurance authorization [facility]    Consultants:   Neurology Psychiatry  Procedures:  EEG  Antimicrobials:  Vancomycin 8/4 - 8/4 Metronidazole 8/4 - 8/4 Cefepime 8/4 - 8/4  Ceftriaxone 8/4 - 8/6   Subjective: Patient seen examined bedside, lying in bed.  Patient reports "strange dreams overnight".  No other complaints or concerns at this time.  Denies headache, no fever/chills/night sweats, no nausea/vomiting/diarrhea, no chest pain, no palpitations, no abdominal pain, no focal weakness, no fatigue, no paresthesias.  No acute events overnight per nursing staff.  Pending insurance authorization for [facility] SNF, medically stable for discharge once bed available.  Objective: Vitals:   06/11/23 1931 06/11/23 2312 06/12/23 0415 06/12/23 0728  BP: 116/73 104/64 115/67 119/68  Pulse: 80 77 69 68  Resp: 18 19 18 18   Temp: 99.2 F (37.3 C) 99.6 F (37.6 C) 97.6 F (36.4 C) 98.3 F (36.8 C)  TempSrc: Oral Oral Oral Oral  SpO2: 98% (!) 86% 97% 97%  Weight:      Height:        Intake/Output Summary (Last 24 hours) at 06/12/2023 1055 Last data filed at 06/12/2023 0800 Gross per 24 hour  Intake --  Output 4451 ml  Net -4451 ml   Filed Weights   06/07/23 1107  Weight: 96.2 kg    Examination:  Physical Exam: GEN: NAD, alert and oriented x 3, obese, chronically ill in appearance, appears older than stated age HEENT: NCAT, PERRL, EOMI, sclera clear, MMM PULM: CTAB w/o wheezes/crackles, normal respiratory effort, on room air CV: RRR w/o M/G/R GI: abd soft, NTND, NABS, no R/G/M MSK: no peripheral edema, moves all extremities independently NEURO: CN II-XII intact, no focal deficits, sensation to light touch intact PSYCH: Depressed mood, flat affect    Data Reviewed: I have personally reviewed following labs and imaging studies  CBC: Recent Labs  Lab 06/06/23 0159 06/08/23 0307 06/09/23 0803  WBC 8.7 8.5 7.7  HGB 8.5* 8.4* 9.3*  HCT 25.0* 25.4* 28.1*  MCV 89.9 89.4 90.6  PLT 383 353 318   Basic Metabolic  Panel: Recent Labs  Lab 06/06/23 0159 06/08/23 0307 06/09/23 0803 06/12/23 0501  NA 137 136 136 136  K 3.4* 3.5 3.9 3.5  CL 101 102 100 98  CO2 27 24 26 24   GLUCOSE 114* 107* 97 112*  BUN 21 23 22  26*  CREATININE 1.68* 1.46* 1.56* 1.68*  CALCIUM 8.3* 8.7* 8.8* 8.8*  MG 1.6* 2.0 2.1  --    GFR: Estimated Creatinine Clearance: 49.3 mL/min (A) (by C-G formula based on SCr of 1.68 mg/dL (H)). Liver Function Tests: No results for input(s): "AST", "ALT", "ALKPHOS", "BILITOT", "PROT", "ALBUMIN" in the last 168 hours. No results for input(s): "LIPASE", "AMYLASE" in the last 168 hours. No results for input(s): "AMMONIA" in the last 168 hours. Coagulation Profile: No results for input(s): "INR", "PROTIME" in the last 168 hours. Cardiac Enzymes: No results for input(s): "CKTOTAL", "CKMB", "CKMBINDEX", "TROPONINI" in the last 168 hours. BNP (last 3 results) No results for input(s): "PROBNP" in the last 8760 hours. HbA1C: No results for input(s): "HGBA1C" in the last 72 hours. CBG: Recent Labs  Lab 06/11/23 0609 06/11/23 1151 06/11/23 1621 06/11/23 2109 06/12/23 0612  GLUCAP 109* 140* 98 96 113*   Lipid Profile: No results for input(s): "CHOL", "HDL", "LDLCALC", "TRIG", "CHOLHDL", "LDLDIRECT" in the last 72 hours. Thyroid Function Tests: No results for input(s): "TSH", "T4TOTAL", "FREET4", "T3FREE", "THYROIDAB" in the last 72 hours. Anemia Panel: No results for input(s): "VITAMINB12", "FOLATE", "FERRITIN", "TIBC", "IRON", "RETICCTPCT" in the last 72 hours. Sepsis Labs: No results for input(s): "PROCALCITON", "LATICACIDVEN" in the last 168 hours.  Recent Results (from the past  240 hour(s))  Culture, blood (Routine X 2) w Reflex to ID Panel     Status: None   Collection Time: 06/03/23  5:32 AM   Specimen: BLOOD LEFT ARM  Result Value Ref Range Status   Specimen Description BLOOD LEFT ARM  Final   Special Requests   Final    BOTTLES DRAWN AEROBIC AND ANAEROBIC Blood Culture  results may not be optimal due to an inadequate volume of blood received in culture bottles   Culture   Final    NO GROWTH 5 DAYS Performed at [facility], [address] [place], [place] [address]    Report Status 06/08/2023 FINAL  Final  Culture, blood (Routine X 2) w Reflex to ID Panel     Status: None   Collection Time: 06/03/23  5:32 AM   Specimen: BLOOD LEFT ARM  Result Value Ref Range Status   Specimen Description BLOOD LEFT ARM  Final   Special Requests   Final    BOTTLES DRAWN AEROBIC AND ANAEROBIC Blood Culture adequate volume   Culture   Final    NO GROWTH 5 DAYS Performed at [facility], [address], [place], [place] [address]    Report Status 06/08/2023 FINAL  Final         Radiology Studies: No results found.      Scheduled Meds:  azelastine  1 spray Each Nare BID   diazepam  15 mg Oral TID   diltiazem  120 mg Oral Daily   enoxaparin (LOVENOX) injection  40 mg Subcutaneous Q24H   feeding supplement  1 Container Oral TID BM   fluticasone  1 spray Each Nare Daily   hydrochlorothiazide  12.5 mg Oral Daily   insulin aspart  0-15 Units Subcutaneous TID WC   insulin aspart  0-5 Units Subcutaneous QHS   loratadine  10 mg Oral Daily   losartan  100 mg Oral Daily   magnesium oxide  400 mg Oral BID   montelukast  10 mg Oral QHS   multivitamin with minerals  1 tablet Oral Daily   naphazoline-glycerin  1-2 drop Both Eyes Q4H   nystatin   Topical BID   rosuvastatin  20 mg Oral Daily   sodium chloride flush  3 mL Intravenous Q12H   Continuous Infusions:   LOS: 10 days    Time spent: 44 minutes spent on chart review, discussion with nursing staff, consultants, updating family and interview/physical exam; more than 50% of that time was spent in counseling and/or coordination of care.    Alvira Philips [place], DO [facility] Available via Epic secure chat 7am-7pm After these hours, please refer to coverage provider listed on  [email or web address] 06/12/2023, 10:55 AM

## 2023-06-12 NOTE — Plan of Care (Signed)
  Problem: Education: Goal: Knowledge of General Education information will improve Description: Including pain rating scale, medication(s)/side effects and non-pharmacologic comfort measures Outcome: Progressing   Problem: Clinical Measurements: Goal: Respiratory complications will improve Outcome: Progressing Goal: Cardiovascular complication will be avoided Outcome: Progressing   Problem: Activity: Goal: Risk for activity intolerance will decrease Outcome: Progressing   Problem: Nutrition: Goal: Adequate nutrition will be maintained Outcome: Progressing   Problem: Elimination: Goal: Will not experience complications related to bowel motility Outcome: Progressing Goal: Will not experience complications related to urinary retention Outcome: Progressing

## 2023-06-13 DIAGNOSIS — A4151 Sepsis due to Escherichia coli [E. coli]: Secondary | ICD-10-CM

## 2023-06-13 LAB — GLUCOSE, CAPILLARY
Glucose-Capillary: 132 mg/dL — ABNORMAL HIGH (ref 70–99)
Glucose-Capillary: 126 mg/dL — ABNORMAL HIGH (ref 70–99)
Glucose-Capillary: 111 mg/dL — ABNORMAL HIGH (ref 70–99)
Glucose-Capillary: 95 mg/dL (ref 70–99)

## 2023-06-13 NOTE — Progress Notes (Signed)
PROGRESS NOTE    Clarence Hamilton  RUE:[id number] DOB: Dec 28, 1955 DOA: 06/02/2023 PCP: Malva Limes, MD    Brief Narrative:   Clarence Hamilton is a 67 y.o. male with past medical history significant for mild persistent asthma/COPD, intolerance to steroids, HTN, HLD, prediabetes, lumbar radiculopathy, chronic pain syndrome, anxiety/depression who presented to [facility] initially on May 29, 2023 with confusion/altered mental status, lethargy in which she was having one-word answers.  Initial labs notable for hyponatremia with a sodium of 125, potassium 2.0, leukocytosis 19.6.  Platelet count 725 pending urine drug screen was positive for opiates, benzos and THC.  Patient was started on broad-spectrum antibiotics admitted to the hospital for possible sepsis secondary to urinary tract infection.  Despite antibiotic treatment, patient continued to have altered mentation and an episode of seizure-like activity.  MR brain was negative but due to recurrent seizure-like activity patient was transferred to [facility] for neurology evaluation.  During hospitalization, patient was seen by psychiatry for concern of benzodiazepine intoxication.  Xanax was discontinued and transition to long-acting Valium.  Psychiatry has signed off with recommendation for gradual taper.  Currently awaiting skilled nursing facility placement, medically stable for discharge once bed available.  Assessment & Plan:   Sepsis secondary to E. coli UTI Diagnosed during admission at [facility], CT scan of the chest/abdomen/pelvis was unrevealing.  Lyme disease serology negative.  Blood cultures x 2 negative x 5 days.  Urine culture with pansensitive E. coli.  Patient completed course of antibiotics.  Leukocytosis has resolved.  Concern for benzodiazepine intoxication Acute toxic/metabolic encephalopathy Anxiety/depression Patient was transferred to [facility] for neurology evaluation given persistent  encephalopathy.  Patient was treated for sepsis secondary to UTI as above also with concern for benzodiazepine intoxication with excessive Xanax use outpatient.  Patient had seizure-like activity, MRI brain negative for acute findings.  Additionally patient on gabapentin, Flexeril at home likely contributing factor.  Long-term EEG negative for seizures.  TSH within normal limits.  Was seen by neurology as well during hospitalization.  Psychiatry was consulted with recommendations of transitioning Xanax to Valium 15 mg p.o. 3 times daily; with recommendation for decreased dose 10 to 20% monthly as tolerates outpatient.  Encephalopathy has resolved and patient's mental status now at baseline.  Anemia No report of blood loss, blood in stool.  Hemoglobin 9.3, stable.  Hypokalemia Hypomagnesia Repleted.  Candidal intertrigo --Nystatin powder  Essential hypertension -- Cardizem CD 120 mg p.o. daily -- Hydrochlorothiazide 12.5 mg p.o. daily. -- Losartan 100 mg p.o. daily  Hyperlipidemia -- Crestor 10 mg p.o. daily  Right eye conjunctivitis Completed 7-day course of Cipro eyedrops. --Refresh Tears as needed  COPD/asthma --Singulair 10 mg p.o. nightly -- Albuterol neb every 4 hours.  Wheezing/shortness of breath  Obesity Body mass index is 32.25 kg/m.  Complicates all facets of care  Debility/deconditioning, generalized weakness, gait disturbance Seen by PT/OT with recommendation of SNF placement. --TOC following -- Continue therapy efforts while inpatient   DVT prophylaxis: enoxaparin (LOVENOX) injection 40 mg Start: 06/03/23 0800 SCDs Start: 06/02/23 2138    Code Status: Full Code Family Communication: No family present at bedside this morning  Disposition Plan:  Level of care: Med-Surg Status is: Inpatient Remains inpatient appropriate because: Medically stable for discharge to SNF once bed available; pending insurance authorization [facility]    Consultants:   Neurology Psychiatry  Procedures:  EEG  Antimicrobials:  Vancomycin 8/4 - 8/4 Metronidazole 8/4 - 8/4 Cefepime 8/4 - 8/4  Ceftriaxone 8/4 - 8/6   Subjective: Patient seen examined bedside, lying in bed.  No specific complaints this morning, reports good sleep.  Concerned about his close/belongings that were left at [facility].  No other questions or concerns at this time.   Denies headache, no fever/chills/night sweats, no nausea/vomiting/diarrhea, no chest pain, no palpitations, no abdominal pain, no focal weakness, no fatigue, no paresthesias.  No acute events overnight per nursing staff.  Pending insurance authorization for [facility] SNF, medically stable for discharge once bed available.  Objective: Vitals:   06/12/23 1951 06/12/23 2308 06/13/23 0321 06/13/23 0721  BP: 120/78 115/66 127/68 115/64  Pulse: 82 84 71 66  Resp: 16 16 18 18   Temp: 99.2 F (37.3 C) 99.2 F (37.3 C) 97.8 F (36.6 C) 98.1 F (36.7 C)  TempSrc: Oral Oral Oral Oral  SpO2: 98% 95% 97% 94%  Weight:      Height:        Intake/Output Summary (Last 24 hours) at 06/13/2023 1025 Last data filed at 06/13/2023 0981 Gross per 24 hour  Intake --  Output 4000 ml  Net -4000 ml   Filed Weights   06/07/23 1107  Weight: 96.2 kg    Examination:  Physical Exam: GEN: NAD, alert and oriented x 3, obese, chronically ill in appearance, appears older than stated age HEENT: NCAT, PERRL, EOMI, sclera clear, MMM PULM: CTAB w/o wheezes/crackles, normal respiratory effort, on room air CV: RRR w/o M/G/R GI: abd soft, NTND, NABS, no R/G/M MSK: no peripheral edema, moves all extremities independently NEURO: CN II-XII intact, no focal deficits, sensation to light touch intact PSYCH: Depressed mood, flat affect    Data Reviewed: I have personally reviewed following labs and imaging studies  CBC: Recent Labs  Lab 06/08/23 0307 06/09/23 0803  WBC 8.5 7.7  HGB 8.4* 9.3*  HCT 25.4* 28.1*  MCV 89.4 90.6   PLT 353 318   Basic Metabolic Panel: Recent Labs  Lab 06/08/23 0307 06/09/23 0803 06/12/23 0501  NA 136 136 136  K 3.5 3.9 3.5  CL 102 100 98  CO2 24 26 24   GLUCOSE 107* 97 112*  BUN 23 22 26*  CREATININE 1.46* 1.56* 1.68*  CALCIUM 8.7* 8.8* 8.8*  MG 2.0 2.1  --    GFR: Estimated Creatinine Clearance: 49.3 mL/min (A) (by C-G formula based on SCr of 1.68 mg/dL (H)). Liver Function Tests: No results for input(s): "AST", "ALT", "ALKPHOS", "BILITOT", "PROT", "ALBUMIN" in the last 168 hours. No results for input(s): "LIPASE", "AMYLASE" in the last 168 hours. No results for input(s): "AMMONIA" in the last 168 hours. Coagulation Profile: No results for input(s): "INR", "PROTIME" in the last 168 hours. Cardiac Enzymes: No results for input(s): "CKTOTAL", "CKMB", "CKMBINDEX", "TROPONINI" in the last 168 hours. BNP (last 3 results) No results for input(s): "PROBNP" in the last 8760 hours. HbA1C: No results for input(s): "HGBA1C" in the last 72 hours. CBG: Recent Labs  Lab 06/12/23 0612 06/12/23 1203 06/12/23 1617 06/12/23 2130 06/13/23 0618  GLUCAP 113* 130* 120* 117* 132*   Lipid Profile: No results for input(s): "CHOL", "HDL", "LDLCALC", "TRIG", "CHOLHDL", "LDLDIRECT" in the last 72 hours. Thyroid Function Tests: No results for input(s): "TSH", "T4TOTAL", "FREET4", "T3FREE", "THYROIDAB" in the last 72 hours. Anemia Panel: No results for input(s): "VITAMINB12", "FOLATE", "FERRITIN", "TIBC", "IRON", "RETICCTPCT" in the last 72 hours. Sepsis Labs: No results for input(s): "PROCALCITON", "LATICACIDVEN" in the last 168 hours.  No results found for this or any previous visit (from  the past 240 hour(s)).        Radiology Studies: No results found.      Scheduled Meds:  azelastine  1 spray Each Nare BID   diazepam  15 mg Oral TID   diltiazem  120 mg Oral Daily   enoxaparin (LOVENOX) injection  40 mg Subcutaneous Q24H   feeding supplement  1 Container Oral TID  BM   fluticasone  1 spray Each Nare Daily   hydrochlorothiazide  12.5 mg Oral Daily   insulin aspart  0-15 Units Subcutaneous TID WC   insulin aspart  0-5 Units Subcutaneous QHS   loratadine  10 mg Oral Daily   losartan  100 mg Oral Daily   magnesium oxide  400 mg Oral BID   montelukast  10 mg Oral QHS   multivitamin with minerals  1 tablet Oral Daily   naphazoline-glycerin  1-2 drop Both Eyes Q4H   nystatin   Topical BID   rosuvastatin  20 mg Oral Daily   sodium chloride flush  3 mL Intravenous Q12H   Continuous Infusions:   LOS: 11 days    Time spent: 44 minutes spent on chart review, discussion with nursing staff, consultants, updating family and interview/physical exam; more than 50% of that time was spent in counseling and/or coordination of care.    Alvira Philips [place], DO [facility] Available via Epic secure chat 7am-7pm After these hours, please refer to coverage provider listed on [email or web address] 06/13/2023, 10:25 AM

## 2023-06-13 NOTE — Progress Notes (Signed)
Physical Therapy Treatment Patient Details Name: Clarence Hamilton MRN: [id number] DOB: May 18, 1956 Today's Date: 06/13/2023   History of Present Illness Pt is a 67 yo male that presented to [facility] on 8/4 for AMS. Workup for acute toxic metabolic encephalopathy, sepsis secondary to UTI, seizure like activity, AKI. Urine drug screen positive for opiates benzos and cannabis. Pt transferred to [facility] for continuous EEG on 8/8. PMH of asthma/COPD, HTN, prediabetes, HLD, chronic pain syndrome, anxiety, depression.    PT Comments  Reports feeling worse today but agreeable to PT session. Showing good recall with bed mobility techniques. A little stronger rising from bed. CGA for safety with ambulation today, required 3 short standing rest breaks to complete distance. Educated on safe AD use and gait mechanics to improve posture and efficiency. Reviewed LE exercises. Encouraged regular LE exercises throughout the day, and to get OOB often with staff assist. He has limited assist available at home and would greatly benefit from a short stay at SNF prior to returning home to prevent decline in function, readmission, and improve functional independence. Patient will benefit from continued inpatient follow up therapy, <3 hours/day. Patient will continue to benefit from skilled physical therapy services to further improve independence with functional mobility.    If plan is discharge home, recommend the following: A little help with walking and/or transfers;A little help with bathing/dressing/bathroom;Assistance with cooking/housework;Direct supervision/assist for medications management;Direct supervision/assist for financial management;Help with stairs or ramp for entrance;Assist for transportation   Can travel by private vehicle     Yes  Equipment Recommendations  Rolling walker (2 wheels)    Recommendations for Other Services       Precautions / Restrictions Precautions Precautions:  Fall Restrictions Weight Bearing Restrictions: No     Mobility  Bed Mobility Overal bed mobility: Needs Assistance Bed Mobility: Rolling, Sidelying to Sit Rolling: Supervision, Used rails Sidelying to sit: Supervision, HOB elevated, Used rails       General bed mobility comments: Good recall for technique to get OOB. HOB slightly elevated and heavy use of rail to rise, supervision for safety.    Transfers Overall transfer level: Needs assistance Equipment used: Rolling walker (2 wheels) Transfers: Sit to/from Stand Sit to Stand: Contact guard assist           General transfer comment: CGA from low bed surface. Cues for set up and hand placement. Improved power-up, RW required to stabilize.    Ambulation/Gait Ambulation/Gait assistance: Contact guard assist Gait Distance (Feet): 115 Feet Assistive device: Rolling walker (2 wheels) Gait Pattern/deviations: Step-through pattern, Decreased step length - right, Decreased step length - left, Decreased stride length, Shuffle, Trunk flexed, Wide base of support Gait velocity: decr Gait velocity interpretation: <1.31 ft/sec, indicative of household ambulator   General Gait Details: Cues for upright posture, to reduce UE pressure and rely more on LEs this date. Pt required 3 short standing rest breaks to complete distance. CGA for safety. No overt buckling, Heavy reliance on RW for support. Not tested without for safety.   Stairs             Wheelchair Mobility     Tilt Bed    Modified Rankin (Stroke Patients Only)       Balance Overall balance assessment: Needs assistance Sitting-balance support: No upper extremity supported, Feet supported Sitting balance-Leahy Scale: Good     Standing balance support: During functional activity, No upper extremity supported Standing balance-Leahy Scale: Fair Standing balance comment: Stands with CGA no UE Support.  Cognition Arousal:  Alert Behavior During Therapy: WFL for tasks assessed/performed Overall Cognitive Status: No family/caregiver present to determine baseline cognitive functioning                                 General Comments: hyperverbose, residrection for task initiation and continuation        Exercises General Exercises - Lower Extremity Ankle Circles/Pumps: AROM, Both, 10 reps, Supine Quad Sets: Strengthening, Both, 10 reps, Seated Gluteal Sets: Strengthening, Both, 10 reps, Seated Long Arc Quad: Strengthening, Both, 20 reps, Seated Hip ABduction/ADduction: Strengthening, Both, 10 reps, Seated    General Comments        Pertinent Vitals/Pain Pain Assessment Pain Assessment: No/denies pain    Home Living                          Prior Function            PT Goals (current goals can now be found in the care plan section) Acute Rehab PT Goals Patient Stated Goal: To redecorate his house PT Goal Formulation: With patient Time For Goal Achievement: 06/20/23 Potential to Achieve Goals: Good Progress towards PT goals: Progressing toward goals    Frequency    Min 1X/week      PT Plan      Co-evaluation              AM-PAC PT "6 Clicks" Mobility   Outcome Measure  Help needed turning from your back to your side while in a flat bed without using bedrails?: A Little Help needed moving from lying on your back to sitting on the side of a flat bed without using bedrails?: A Little Help needed moving to and from a bed to a chair (including a wheelchair)?: A Little Help needed standing up from a chair using your arms (e.g., wheelchair or bedside chair)?: A Little Help needed to walk in hospital room?: A Little Help needed climbing 3-5 steps with a railing? : A Lot 6 Click Score: 17    End of Session Equipment Utilized During Treatment: Gait belt Activity Tolerance: Patient tolerated treatment well Patient left: with call bell/phone within  reach;with SCD's reapplied;in chair;with chair alarm set Nurse Communication: Mobility status PT Visit Diagnosis: Unsteadiness on feet (R26.81);Other abnormalities of gait and mobility (R26.89);Muscle weakness (generalized) (M62.81)     Time: 0272-5366 PT Time Calculation (min) (ACUTE ONLY): 20 min  Charges:    $Gait Training: 8-22 mins PT General Charges $$ ACUTE PT VISIT: 1 Visit                     Kathlyn Sacramento, PT, DPT [facility]  Rehabilitation Services Physical Therapist Office: [phone] Website: [email or web address]    Berton Mount 06/13/2023, 11:17 AM

## 2023-06-13 NOTE — TOC Progression Note (Addendum)
Transition of Care [facility]) - Progression Note    Patient Details  Name: Clarence Hamilton MRN: [id number] Date of Birth: 21-Feb-1956  [facility] of [facility]) CM/SW Contact  Baldemar Lenis, [place] Phone Number: 06/13/2023, 10:22 AM  Clinical Narrative:   CSW received update that insurance is asking for updated PT note. CSW sent message to PT assigned, will upload once note is entered. CSW to follow.  UPDATE: PT notified CSW that updated note was entered. CSW updated CMA to upload the update to [facility] for authorization request, authorization still pending. CSW to follow.  UPDATE: CSW notified that patient has been denied, insurance offering peer to peer. MD unable to argue for SNF with patient's functioning, met with patient who is in agreement with going home. Insurance denied SNF placement, CSW will set patient up for home health with plan to discharge home tomorrow.  CSW received call from nephew, Nida Boatman, that he was updated that patient would be going home tomorrow. Lengthy discussion with Nida Boatman who presented concerns about the patient's living situation. CSW discussed calling and making an APS report, but unable to do anything else as patient has capacity to make his own decisions and he wants to go home. CSW can also setup maximum home health care. Brad expressed frustration in disposition plan. CSW staffed case with [facility] Supervisor to ensure that everything that can be done for patient is being done. CSW to follow.    Expected Discharge Plan: Skilled Nursing Facility Barriers to Discharge: Continued Medical Work up, English as a second language teacher  Expected Discharge Plan and Services                                               Social Determinants of Health (SDOH) Interventions SDOH Screenings   Food Insecurity: No Food Insecurity (06/04/2023)  Housing: Patient Declined (06/04/2023)  Transportation Needs: No Transportation Needs (06/04/2023)  Utilities: At Risk  (06/04/2023)  Alcohol Screen: Low Risk  (02/26/2022)  Depression (PHQ2-9): Medium Risk (02/26/2022)  Financial Resource Strain: Low Risk  (10/22/2021)  Physical Activity: Inactive (03/25/2020)  Social Connections: Socially Isolated (03/25/2020)  Stress: No Stress Concern Present (03/25/2020)  Tobacco Use: Low Risk  (06/04/2023)    Readmission Risk Interventions     No data to display

## 2023-06-14 ENCOUNTER — Other Ambulatory Visit (HOSPITAL_COMMUNITY): Payer: Self-pay

## 2023-06-14 DIAGNOSIS — F32A Depression, unspecified: Secondary | ICD-10-CM

## 2023-06-14 LAB — GLUCOSE, CAPILLARY
Glucose-Capillary: 116 mg/dL — ABNORMAL HIGH (ref 70–99)
Glucose-Capillary: 114 mg/dL — ABNORMAL HIGH (ref 70–99)
Glucose-Capillary: 109 mg/dL — ABNORMAL HIGH (ref 70–99)

## 2023-06-14 MED ORDER — DILTIAZEM HCL ER COATED BEADS 120 MG PO CP24
120.0000 mg | ORAL_CAPSULE | Freq: Every day | ORAL | 0 refills | Status: AC
  Filled 2023-06-14: qty 90, 90d supply, fill #0

## 2023-06-14 MED ORDER — DIAZEPAM 5 MG PO TABS
15.0000 mg | ORAL_TABLET | Freq: Three times a day (TID) | ORAL | 0 refills | Status: DC
Start: 2023-06-14 — End: 2023-07-11
  Filled 2023-06-14: qty 240, 27d supply, fill #0

## 2023-06-14 MED ORDER — MAGNESIUM OXIDE -MG SUPPLEMENT 400 (240 MG) MG PO TABS
400.0000 mg | ORAL_TABLET | Freq: Two times a day (BID) | ORAL | 0 refills | Status: AC
  Filled 2023-06-14: qty 180, 90d supply, fill #0

## 2023-06-14 MED ORDER — NAPHAZOLINE-GLYCERIN 0.012-0.25 % OP SOLN
1.0000 [drp] | Freq: Four times a day (QID) | OPHTHALMIC | 0 refills | Status: DC | PRN
Start: 2023-06-14 — End: 2023-06-29
  Filled 2023-06-14: qty 6, 15d supply, fill #0

## 2023-06-14 MED ORDER — FLUTICASONE PROPIONATE 50 MCG/ACT NA SUSP
1.0000 | Freq: Every day | NASAL | 0 refills | Status: AC
  Filled 2023-06-14: qty 16, 60d supply, fill #0

## 2023-06-14 MED ORDER — LOSARTAN POTASSIUM 100 MG PO TABS
100.0000 mg | ORAL_TABLET | Freq: Every day | ORAL | 0 refills | Status: DC
  Filled 2023-06-14: qty 90, 90d supply, fill #0

## 2023-06-14 MED ORDER — HYDROCHLOROTHIAZIDE 12.5 MG PO TABS
12.5000 mg | ORAL_TABLET | Freq: Every day | ORAL | 0 refills | Status: AC
  Filled 2023-06-14: qty 90, 90d supply, fill #0

## 2023-06-14 MED ORDER — MONTELUKAST SODIUM 10 MG PO TABS
10.0000 mg | ORAL_TABLET | Freq: Every day | ORAL | 0 refills | Status: DC
  Filled 2023-06-14: qty 90, 90d supply, fill #0

## 2023-06-14 MED ORDER — ALBUTEROL SULFATE HFA 108 (90 BASE) MCG/ACT IN AERS
1.0000 | INHALATION_SPRAY | Freq: Four times a day (QID) | RESPIRATORY_TRACT | 10 refills | Status: AC | PRN
  Filled 2023-06-14: qty 18, 50d supply, fill #0

## 2023-06-14 MED ORDER — LORATADINE 10 MG PO TABS
10.0000 mg | ORAL_TABLET | Freq: Every day | ORAL | 0 refills | Status: DC
  Filled 2023-06-14: qty 100, 100d supply, fill #0

## 2023-06-14 MED ORDER — ROSUVASTATIN CALCIUM 20 MG PO TABS
20.0000 mg | ORAL_TABLET | Freq: Every day | ORAL | 0 refills | Status: AC
Start: 2023-06-14 — End: ?
  Filled 2023-06-14: qty 90, 90d supply, fill #0

## 2023-06-14 NOTE — Progress Notes (Signed)
Pt d/c via wheelchair to cab. 2 cab tickets given to cab driver. Pt is due to go to [facility] to pick up belonging and then cab will transport pt home. Walker sent with pt.   06/14/23 2027  AVS Discharge Documentation  AVS Discharge Instructions Including Medications Provided to patient/caregiver  Name of Person Receiving AVS Discharge Instructions Including Medications Clarence Hamilton  Name of Clinician That Reviewed AVS Discharge Instructions Including Medications Fanny Bien

## 2023-06-14 NOTE — Plan of Care (Signed)
Problem: Education: Goal: Knowledge of General Education information will improve Description: Including pain rating scale, medication(s)/side effects and non-pharmacologic comfort measures 06/14/2023 2027 by Asencion Gowda, RN Outcome: Adequate for Discharge 06/14/2023 1819 by Asencion Gowda, RN Outcome: Progressing   Problem: Health Behavior/Discharge Planning: Goal: Ability to manage health-related needs will improve 06/14/2023 2027 by Asencion Gowda, RN Outcome: Adequate for Discharge 06/14/2023 1819 by Asencion Gowda, RN Outcome: Progressing   Problem: Clinical Measurements: Goal: Ability to maintain clinical measurements within normal limits will improve 06/14/2023 2027 by Asencion Gowda, RN Outcome: Adequate for Discharge 06/14/2023 1819 by Asencion Gowda, RN Outcome: Progressing Goal: Will remain free from infection 06/14/2023 2027 by Asencion Gowda, RN Outcome: Adequate for Discharge 06/14/2023 1819 by Asencion Gowda, RN Outcome: Progressing Goal: Diagnostic test results will improve 06/14/2023 2027 by Asencion Gowda, RN Outcome: Adequate for Discharge 06/14/2023 1819 by Asencion Gowda, RN Outcome: Progressing Goal: Respiratory complications will improve 06/14/2023 2027 by Asencion Gowda, RN Outcome: Adequate for Discharge 06/14/2023 1819 by Asencion Gowda, RN Outcome: Progressing Goal: Cardiovascular complication will be avoided 06/14/2023 2027 by Asencion Gowda, RN Outcome: Adequate for Discharge 06/14/2023 1819 by Asencion Gowda, RN Outcome: Progressing   Problem: Activity: Goal: Risk for activity intolerance will decrease 06/14/2023 2027 by Asencion Gowda, RN Outcome: Adequate for Discharge 06/14/2023 1819 by Asencion Gowda, RN Outcome: Progressing   Problem: Nutrition: Goal: Adequate nutrition will be maintained 06/14/2023 2027 by Asencion Gowda, RN Outcome: Adequate for  Discharge 06/14/2023 1819 by Asencion Gowda, RN Outcome: Progressing   Problem: Coping: Goal: Level of anxiety will decrease 06/14/2023 2027 by Asencion Gowda, RN Outcome: Adequate for Discharge 06/14/2023 1819 by Asencion Gowda, RN Outcome: Progressing   Problem: Elimination: Goal: Will not experience complications related to bowel motility 06/14/2023 2027 by Asencion Gowda, RN Outcome: Adequate for Discharge 06/14/2023 1819 by Asencion Gowda, RN Outcome: Progressing Goal: Will not experience complications related to urinary retention 06/14/2023 2027 by Asencion Gowda, RN Outcome: Adequate for Discharge 06/14/2023 1819 by Asencion Gowda, RN Outcome: Progressing   Problem: Pain Managment: Goal: General experience of comfort will improve 06/14/2023 2027 by Asencion Gowda, RN Outcome: Adequate for Discharge 06/14/2023 1819 by Asencion Gowda, RN Outcome: Progressing   Problem: Safety: Goal: Ability to remain free from injury will improve 06/14/2023 2027 by Asencion Gowda, RN Outcome: Adequate for Discharge 06/14/2023 1819 by Asencion Gowda, RN Outcome: Progressing   Problem: Skin Integrity: Goal: Risk for impaired skin integrity will decrease 06/14/2023 2027 by Asencion Gowda, RN Outcome: Adequate for Discharge 06/14/2023 1819 by Asencion Gowda, RN Outcome: Progressing   Problem: Education: Goal: Ability to describe self-care measures that may prevent or decrease complications (Diabetes Survival Skills Education) will improve 06/14/2023 2027 by Asencion Gowda, RN Outcome: Adequate for Discharge 06/14/2023 1819 by Asencion Gowda, RN Outcome: Progressing Goal: Individualized Educational Video(s) 06/14/2023 2027 by Asencion Gowda, RN Outcome: Adequate for Discharge 06/14/2023 1819 by Asencion Gowda, RN Outcome: Progressing   Problem: Coping: Goal: Ability to adjust to condition or change in health  will improve 06/14/2023 2027 by Asencion Gowda, RN Outcome: Adequate for Discharge 06/14/2023 1819 by Asencion Gowda, RN Outcome: Progressing   Problem: Fluid Volume: Goal: Ability to maintain a balanced intake and output will improve 06/14/2023 2027 by Asencion Gowda, RN Outcome: Adequate for Discharge 06/14/2023 1819 by Dorathy Daft,  Jearld Lesch, RN Outcome: Progressing   Problem: Health Behavior/Discharge Planning: Goal: Ability to identify and utilize available resources and services will improve 06/14/2023 2027 by Asencion Gowda, RN Outcome: Adequate for Discharge 06/14/2023 1819 by Asencion Gowda, RN Outcome: Progressing Goal: Ability to manage health-related needs will improve 06/14/2023 2027 by Asencion Gowda, RN Outcome: Adequate for Discharge 06/14/2023 1819 by Asencion Gowda, RN Outcome: Progressing   Problem: Metabolic: Goal: Ability to maintain appropriate glucose levels will improve 06/14/2023 2027 by Asencion Gowda, RN Outcome: Adequate for Discharge 06/14/2023 1819 by Asencion Gowda, RN Outcome: Progressing   Problem: Nutritional: Goal: Maintenance of adequate nutrition will improve 06/14/2023 2027 by Asencion Gowda, RN Outcome: Adequate for Discharge 06/14/2023 1819 by Asencion Gowda, RN Outcome: Progressing Goal: Progress toward achieving an optimal weight will improve 06/14/2023 2027 by Asencion Gowda, RN Outcome: Adequate for Discharge 06/14/2023 1819 by Asencion Gowda, RN Outcome: Progressing   Problem: Skin Integrity: Goal: Risk for impaired skin integrity will decrease 06/14/2023 2027 by Asencion Gowda, RN Outcome: Adequate for Discharge 06/14/2023 1819 by Asencion Gowda, RN Outcome: Progressing   Problem: Tissue Perfusion: Goal: Adequacy of tissue perfusion will improve 06/14/2023 2027 by Asencion Gowda, RN Outcome: Adequate for Discharge 06/14/2023 1819 by Asencion Gowda,  RN Outcome: Progressing

## 2023-06-14 NOTE — TOC Transition Note (Signed)
Transition of Care [facility]) - CM/SW Discharge Note   Patient Details  Name: Clarence Hamilton MRN: [id number] Date of Birth: [birth year]  Transition of Care [facility]) CM/SW Contact:  Baldemar Lenis, LCSW Phone Number: 06/14/2023, 4:30 PM   Clinical Narrative:   CSW contacted [employer]PS earlier today to file report based on nephew's reports of the patient's living conditions. APS indicated they will review and follow case. CSW received call from nephew, Nida Boatman, who expressed continued frustration about patient going home. Nida Boatman continues to say that he feels it is unsafe for the patient go home. CSW met with patient to discuss, he wants to go home. Patient is in agreement with home health and a walker. Order placed with [employer] for walker. Home health referral given to [facility], discussed the family's concerns about the patient's safety going home, they will ensure they will see him within 24 hours of going home.   Patient's keys and wallet are supposedly locked in security at [facility], they did not come with him when he transferred to [facility]. CSW contacted Security at [facility] to discuss concern, they are looking for his belongings. CSW discussed with [facility] Supervisor, she has also contacted Security to ask them to locate belongings. Patient will need cab voucher once belongings are located. CSW to follow.    Final next level of care: Home w Home Health Services Barriers to Discharge: Other (must enter comment) (unable to locate patient's wallet and keys)   Patient Goals and CMS Choice      Discharge Placement                  Patient to be transferred to facility by: Cab voucher Name of family member notified: Brad Patient and family notified of of transfer: 06/14/23  Discharge Plan and Services Additional resources added to the After Visit Summary for                  DME Arranged: Walker rolling DME Agency: [facility] Date DME Agency Contacted: 06/14/23     HH Arranged: PT, OT, Nurse's Aide,  Social Work [employer] Agency: [facility] Date HH Agency Contacted: 06/14/23      Social Determinants of Health (SDOH) Interventions SDOH Screenings   Food Insecurity: No Food Insecurity (06/04/2023)  Housing: Patient Declined (06/04/2023)  Transportation Needs: No Transportation Needs (06/04/2023)  Utilities: At Risk (06/04/2023)  Alcohol Screen: Low Risk  (02/26/2022)  Depression (PHQ2-9): Medium Risk (02/26/2022)  Financial Resource Strain: Low Risk  (10/22/2021)  Physical Activity: Inactive (03/25/2020)  Social Connections: Socially Isolated (03/25/2020)  Stress: No Stress Concern Present (03/25/2020)  Tobacco Use: Low Risk  (06/04/2023)     Readmission Risk Interventions     No data to display

## 2023-06-14 NOTE — Plan of Care (Signed)

## 2023-06-14 NOTE — Discharge Summary (Addendum)
Physician Discharge Summary  Clarence Hamilton DOB: August 20, 1956 DOA: 06/02/2023  PCP: Malva Limes, MD  Admit date: 06/02/2023 Discharge date: 06/14/2023  Admitted From: Home, transfer from [facility] Disposition: Home, insurance declined authorization for SNF  Recommendations for Outpatient Follow-up:  Follow up with PCP in 1-2 weeks Ambulatory referral placed to behavioral health [facility] Continue Valium 50 mg p.o. 3 times daily; will need further dose tapering outpatient 10-20% monthly as tolerates  Home Health: PT/OT/RN/aide/social work Equipment/Devices: Agricultural consultant  Discharge Condition: Stable CODE STATUS: Full code Diet recommendation: Heart healthy diet  History of present illness:  Clarence Hamilton is a 67 y.o. male with past medical history significant for mild persistent asthma/COPD, intolerance to steroids, HTN, HLD, prediabetes, lumbar radiculopathy, chronic pain syndrome, anxiety/depression who presented to [facility] initially on May 29, 2023 with confusion/altered mental status, lethargy in which she was having one-word answers.  Initial labs notable for hyponatremia with a sodium of 125, potassium 2.0, leukocytosis 19.6.  Platelet count 725 pending urine drug screen was positive for opiates, benzos and THC.  Patient was started on broad-spectrum antibiotics admitted to the hospital for possible sepsis secondary to urinary tract infection.  Despite antibiotic treatment, patient continued to have altered mentation and an episode of seizure-like activity.  MR brain was negative but due to recurrent seizure-like activity patient was transferred to [facility] for neurology evaluation.   During hospitalization, patient was seen by psychiatry for concern of benzodiazepine intoxication.  Xanax was discontinued and transition to long-acting Valium.  Psychiatry has signed off with recommendation for gradual taper.  Patient was awaiting skilled nursing  facility placement, unfortunately his insurance provider declined authorization.  Patient will now discharge home with home health services.  Hospital course:  Sepsis secondary to E. coli UTI Diagnosed during admission at [facility], CT scan of the chest/abdomen/pelvis was unrevealing.  Lyme disease serology negative.  Blood cultures x 2 negative x 5 days.  Urine culture with pansensitive E. coli.  Patient completed course of antibiotics.  Leukocytosis has resolved.   Concern for benzodiazepine intoxication Acute toxic/metabolic encephalopathy Anxiety/depression Patient was transferred to [facility] for neurology evaluation given persistent encephalopathy.  Patient was treated for sepsis secondary to UTI as above also with concern for benzodiazepine intoxication with excessive Xanax use outpatient.  Patient had seizure-like activity, MRI brain negative for acute findings.  Additionally patient on gabapentin, Flexeril at home likely contributing factor.  Long-term EEG negative for seizures.  TSH within normal limits.  Was seen by neurology as well during hospitalization.  Psychiatry was consulted with recommendations of transitioning Xanax to Valium 15 mg p.o. 3 times daily; with recommendation for decreased dose 10 to 20% monthly as tolerates outpatient.  Encephalopathy has resolved and patient's mental status now at baseline.   Anemia No report of blood loss, blood in stool.  Hemoglobin 9.3, stable.   Hypokalemia Hypomagnesia Repleted.   Candidal intertrigo Treated with nystatin powder.   Essential hypertension Continue Cardizem CD 120 mg p.o. daily, Hydrochlorothiazide 12.5 mg p.o. daily, Losartan 100 mg p.o. daily   Hyperlipidemia Crestor 10 mg p.o. daily   Right eye conjunctivitis Completed 7-day course of Cipro eyedrops. Refresh Tears as needed   COPD/asthma Singulair 10 mg p.o. nightly, Albuterol MDI PRN.    Obesity Body mass index is 32.25 kg/m.  Complicates all facets  of care   Debility/deconditioning, generalized weakness, gait disturbance Seen by PT/OT with recommendation of SNF placement.  Patient progressed well during  hospitalization and unfortunately patient's insurance provider declined authorization for SNF placement.  Patient discharging home with home health services.  Ethics: During the patients hospitalization, it was determined that he does possess the ability/capacity to adequately make his own medical decisions.  The patient was able to understand and adequately reason to his treatment care, options, situation; and ultimately was able to clearly express a treatment choice.       Discharge Diagnoses:  Active Problems:   Anxiety disorder   Altered mental status   Sepsis secondary to UTI (HCC)   AMS (altered mental status)   Malnutrition of moderate degree    Discharge Instructions  Discharge Instructions     Ambulatory referral to Behavioral Health   Complete by: As directed    Call MD for:  difficulty breathing, headache or visual disturbances   Complete by: As directed    Call MD for:  extreme fatigue   Complete by: As directed    Call MD for:  persistant dizziness or light-headedness   Complete by: As directed    Call MD for:  persistant nausea and vomiting   Complete by: As directed    Call MD for:  severe uncontrolled pain   Complete by: As directed    Call MD for:  temperature >100.4   Complete by: As directed    Diet - low sodium heart healthy   Complete by: As directed    Increase activity slowly   Complete by: As directed       Allergies as of 06/14/2023       Reactions   Mustard Cough   Reports nasal congestion and cough, wanted notation in chart .    Sulfa Antibiotics         Medication List     STOP taking these medications    alprazolam 2 MG tablet Commonly known as: XANAX   busPIRone 15 MG tablet Commonly known as: BUSPAR   fexofenadine 180 MG tablet Commonly known as: ALLEGRA    furosemide 20 MG tablet Commonly known as: LASIX   OXYGEN   Tiadylt ER 120 MG 24 hr capsule Generic drug: diltiazem   venlafaxine XR 75 MG 24 hr capsule Commonly known as: EFFEXOR-XR       TAKE these medications    CENTRUM SILVER PO Take 1 tablet by mouth daily.   diazepam 5 MG tablet Commonly known as: VALIUM Take 3 tablets (15 mg total) by mouth 3 (three) times daily.   diltiazem 120 MG 24 hr capsule Commonly known as: CARDIZEM CD Take 1 capsule (120 mg total) by mouth daily. Start taking on: June 15, 2023   fluticasone 50 MCG/ACT nasal spray Commonly known as: FLONASE Place 1 spray into both nostrils daily. Start taking on: June 15, 2023   hydrochlorothiazide 12.5 MG tablet Commonly known as: HYDRODIURIL Take 1 tablet (12.5 mg total) by mouth daily. Start taking on: June 15, 2023 What changed:  medication strength See the new instructions.   loratadine 10 MG tablet Commonly known as: CLARITIN Take 1 tablet (10 mg total) by mouth daily. Start taking on: June 15, 2023   losartan 100 MG tablet Commonly known as: COZAAR Take 1 tablet (100 mg total) by mouth daily.   magnesium oxide 400 (240 Mg) MG tablet Commonly known as: MAG-OX Take 1 tablet (400 mg total) by mouth 2 (two) times daily.   montelukast 10 MG tablet Commonly known as: SINGULAIR Take 1 tablet (10 mg total) by mouth at bedtime. What  changed: when to take this   naloxone 4 MG/0.1ML Liqd nasal spray kit Commonly known as: NARCAN Narcan 4 mg/actuation nasal spray  Take by nasal route every 3 minutes until patient awakes or EMS arrives.   naphazoline-glycerin 0.012-0.25 % Soln Commonly known as: CLEAR EYES REDNESS Place 1-2 drops into both eyes 4 (four) times daily as needed for eye irritation.   ProAir HFA 108 (90 Base) MCG/ACT inhaler Generic drug: albuterol Inhale 1 puff into the lungs every 6 (six) hours as needed.   rosuvastatin 20 MG tablet Commonly known as:  CRESTOR Take 1 tablet (20 mg total) by mouth daily.               Durable Medical Equipment  (From admission, onward)           Start     Ordered   06/13/23 1432  For home use only DME Walker rolling  Once       Question Answer Comment  Walker: With 5 Inch Wheels   Patient needs a walker to treat with the following condition Gait abnormality      06/13/23 1431            Follow-up Information     Fisher, Demetrios Isaacs, MD. Schedule an appointment as soon as possible for a visit in 1 week(s).   Specialty: Family Medicine Contact information: [address] [place] [address] [phone]                Allergies  Allergen Reactions   Mustard Cough    Reports nasal congestion and cough, wanted notation in chart .    Sulfa Antibiotics     Consultations: Psychiatry, Dr. Gasper Sells   Procedures/Studies: Overnight EEG with video  Result Date: 06/03/2023 Charlsie Quest, MD     06/04/2023  8:03 AM Patient Name: Clarence Hamilton MRN: [id number] Epilepsy Attending: Charlsie Quest Referring Physician/Provider: Erick Blinks, MD Duration: 06/03/2023 0147 to 06/04/2023 0147 Patient history: 67yo M with seizure like activity getting eeg to evaluate for seizure Level of alertness: Awake, asleep AEDs during EEG study: Xanax Technical aspects: This EEG study was done with scalp electrodes positioned according to the 10-20 International system of electrode placement. Electrical activity was reviewed with band pass filter of 1-70Hz , sensitivity of 7 uV/mm, display speed of 68mm/sec with a 60Hz  notched filter applied as appropriate. EEG data were recorded continuously and digitally stored.  Video monitoring was available and reviewed as appropriate. Description: The posterior dominant rhythm consists of 9 Hz activity of moderate voltage (25-35 uV) seen predominantly in posterior head regions, symmetric and reactive to eye opening and eye closing. Sleep was  characterized by vertex waves, sleep spindles (12 to 14 Hz), maximal frontocentral region. There is 15 to 18 Hz beta activity with irregular morphology distributed symmetrically and diffusely. Hyperventilation and photic stimulation were not performed.   IMPRESSION: This study is within normal limits. No seizures or epileptiform discharges were seen throughout the recording. A normal interictal EEG does not exclude the diagnosis of epilepsy. Priyanka [name]   CT HEAD WO CONTRAST ( )  Result Date: 06/02/2023 CLINICAL DATA:  Neuro deficit, stroke suspected. EXAM: CT HEAD WITHOUT CONTRAST TECHNIQUE: Contiguous axial images were obtained from the base of the skull through the vertex without intravenous contrast. RADIATION DOSE REDUCTION: This exam was performed according to the departmental dose-optimization program which includes automated exposure control, adjustment of the mA and/or kV according to patient size and/or use of  iterative reconstruction technique. COMPARISON:  Brain MRI 1 day prior FINDINGS: Brain: There is no evidence of acute intracranial hemorrhage, extra-axial fluid collection, or acute infarct. Parenchymal volume is stable. The ventricles are stable in size. Gray-white differentiation is preserved. The pituitary and suprasellar region are normal. There is no mass lesion. There is no mass effect or midline shift. Vascular: There is calcification of the bilateral carotid siphons. Skull: Normal. Negative for fracture or focal lesion. Sinuses/Orbits: There is mild mucosal thickening in the paranasal sinuses. The globes and orbits are unremarkable. Other: The mastoid air cells and middle ear cavities are clear. IMPRESSION: Stable noncontrast head CT with no acute intracranial pathology. Electronically Signed   By: Lesia Hausen M.D.   On: 06/02/2023 15:26   MR BRAIN WO CONTRAST  Result Date: 06/01/2023 CLINICAL DATA:  Altered mental status EXAM: MRI HEAD WITHOUT CONTRAST TECHNIQUE: Multiplanar,  multiecho pulse sequences of the brain and surrounding structures were obtained without intravenous contrast. COMPARISON:  None Available. FINDINGS: Brain: No acute infarct, mass effect or extra-axial collection. No acute or chronic hemorrhage. There is multifocal hyperintense T2-weighted signal within the white matter. Generalized volume loss. The midline structures are normal. Vascular: Major flow voids are preserved. Skull and upper cervical spine: Normal calvarium and skull base. Visualized upper cervical spine and soft tissues are normal. Sinuses/Orbits:No paranasal sinus fluid levels or advanced mucosal thickening. No mastoid or middle ear effusion. Normal orbits. IMPRESSION: 1. No acute intracranial abnormality. 2. Findings of chronic small vessel ischemia and volume loss. Electronically Signed   By: Deatra Robinson M.D.   On: 06/01/2023 20:03   EEG adult  Result Date: 06/01/2023 Charlsie Quest, MD     06/01/2023  2:02 PM Patient Name: Clarence Hamilton MRN: [id number] Epilepsy Attending: Charlsie Quest Referring Physician/Provider: Milon Dikes, MD Date: 06/01/2023 Duration: 27.52 mins Patient history: 67yo M with ams getting eeg to evaluate for seizure. Level of alertness: Awake AEDs during EEG study: Valium Technical aspects: This EEG study was done with scalp electrodes positioned according to the 10-20 International system of electrode placement. Electrical activity was reviewed with band pass filter of 1-70Hz , sensitivity of 7 uV/mm, display speed of 46mm/sec with a 60Hz  notched filter applied as appropriate. EEG data were recorded continuously and digitally stored.  Video monitoring was available and reviewed as appropriate. Description: No clear posterior dominant rhythm was seen. There is an excessive amount of 15 to 18 Hz beta activity distributed symmetrically and diffusely. Hyperventilation and photic stimulation were not performed.   ABNORMALITY - Excessive beta, generalized IMPRESSION: This  study is within normal limits. The excessive beta activity seen in the background is most likely due to the effect of benzodiazepine and is a benign EEG pattern. No seizures or epileptiform discharges were seen throughout the recording. A normal interictal EEG does not exclude the diagnosis of epilepsy. Priyanka [name]   CT HEAD WO CONTRAST ( )  Result Date: 05/31/2023 CLINICAL DATA:  Mental status change, unknown cause EXAM: CT HEAD WITHOUT CONTRAST TECHNIQUE: Contiguous axial images were obtained from the base of the skull through the vertex without intravenous contrast. RADIATION DOSE REDUCTION: This exam was performed according to the departmental dose-optimization program which includes automated exposure control, adjustment of the mA and/or kV according to patient size and/or use of iterative reconstruction technique. COMPARISON:  Two days ago 05/29/2023 FINDINGS: Brain: No intracranial hemorrhage, mass effect, or midline shift. No hydrocephalus. The basilar cisterns are patent. Mild periventricular chronic small vessel ischemia, not  unexpected for age. No evidence of territorial infarct or acute ischemia. No extra-axial or intracranial fluid collection. Vascular: Atherosclerosis of skullbase vasculature without hyperdense vessel or abnormal calcification. Skull: No fracture or focal lesion. Sinuses/Orbits: No acute finding. Other: None. IMPRESSION: 1. No acute intracranial abnormality. 2. Mild chronic small vessel ischemia. Electronically Signed   By: Narda Rutherford M.D.   On: 05/31/2023 20:31   CT CHEST ABDOMEN PELVIS W CONTRAST  Result Date: 05/29/2023 CLINICAL DATA:  Sepsis EXAM: CT CHEST, ABDOMEN, AND PELVIS WITH CONTRAST TECHNIQUE: Multidetector CT imaging of the chest, abdomen and pelvis was performed following the standard protocol during bolus administration of intravenous contrast. RADIATION DOSE REDUCTION: This exam was performed according to the departmental dose-optimization program  which includes automated exposure control, adjustment of the mA and/or kV according to patient size and/or use of iterative reconstruction technique. CONTRAST:  80mL OMNIPAQUE IOHEXOL 300 MG/ML  SOLN COMPARISON:  None Available. FINDINGS: Examination is generally limited by patient and breath motion artifact throughout. CT CHEST FINDINGS Cardiovascular: Aortic atherosclerosis. Normal heart size. Three-vessel coronary artery calcifications. No pericardial effusion. Mediastinum/Nodes: No enlarged mediastinal, hilar, or axillary lymph nodes. Small hiatal hernia. Thyroid gland and esophagus demonstrate no significant findings. Lungs/Pleura: Tracheobronchomalacia. Diffuse bilateral bronchial wall thickening. Mosaic attenuation of the airspaces. No pleural effusion or pneumothorax. Musculoskeletal: No chest wall abnormality. No acute osseous findings. CT ABDOMEN PELVIS FINDINGS Hepatobiliary: No solid liver abnormality is seen. No gallstones, gallbladder wall thickening, or biliary dilatation. Pancreas: Unremarkable. No pancreatic ductal dilatation or surrounding inflammatory changes. Spleen: Normal in size without significant abnormality. Adrenals/Urinary Tract: Adrenal glands are unremarkable. Kidneys are normal, without renal calculi, solid lesion, or hydronephrosis. Bladder is unremarkable. Stomach/Bowel: Stomach is within normal limits. Appendix appears normal. No evidence of bowel wall thickening, distention, or inflammatory changes. Vascular/Lymphatic: Aortic atherosclerosis. No enlarged abdominal or pelvic lymph nodes. Reproductive: Prostatomegaly. Other: Small fat containing umbilical hernia. Small fat containing bilateral inguinal hernias. No ascites. Musculoskeletal: No acute osseous findings. IMPRESSION: 1. Examination is generally limited by patient and breath motion artifact throughout. 2. Within this limitation, no acute CT findings of the chest, abdomen, or pelvis to explain sepsis. 3.  Tracheobronchomalacia. Diffuse bilateral bronchial wall thickening. Mosaic attenuation of the airspaces, suggestive of small airways disease. 4. Coronary artery disease. 5. Prostatomegaly. Aortic Atherosclerosis (ICD10-I70.0). Electronically Signed   By: Jearld Lesch M.D.   On: 05/29/2023 20:32   DG Chest Port 1 View  Result Date: 05/29/2023 CLINICAL DATA:  Questionable sepsis - evaluate for abnormality EXAM: PORTABLE CHEST 1 VIEW COMPARISON:  None Available. FINDINGS: Low lung volumes. Heart and mediastinal contours are within normal limits. No focal opacities or effusions. No acute bony abnormality. IMPRESSION: Low volumes.  No active cardiopulmonary disease. Electronically Signed   By: Charlett Nose M.D.   On: 05/29/2023 18:37   CT Head Wo Contrast  Result Date: 05/29/2023 CLINICAL DATA:  Mental status change, unknown cause EXAM: CT HEAD WITHOUT CONTRAST TECHNIQUE: Contiguous axial images were obtained from the base of the skull through the vertex without intravenous contrast. RADIATION DOSE REDUCTION: This exam was performed according to the departmental dose-optimization program which includes automated exposure control, adjustment of the mA and/or kV according to patient size and/or use of iterative reconstruction technique. COMPARISON:  None Available. FINDINGS: Brain: No acute intracranial abnormality. Specifically, no hemorrhage, hydrocephalus, mass lesion, acute infarction, or significant intracranial injury. Vascular: No hyperdense vessel or unexpected calcification. Skull: No acute calvarial abnormality. Sinuses/Orbits: No acute findings Other: None IMPRESSION: No acute  intracranial abnormality. Electronically Signed   By: Charlett Nose M.D.   On: 05/29/2023 18:36     Subjective: Patient seen examined bedside, resting calmly.  No specific complaints this morning.  Discussed with patient that his insurance carrier declined authorization for skilled nursing facility placement and he will have to  go home with home health.  Requesting transportation home and a walker.  No other questions or concerns at this time.  Discussed with patient will refer to behavioral health as well as needs close follow-up with his PCP after discharge.  No other specific questions or concerns at this time.  Denies headache, no dizziness, no chest pain, no palpitations, no shortness of breath, no abdominal pain, no fever/chills/night sweats, no nausea/vomiting/diarrhea, no focal weakness, no fatigue, no paresthesias.  No acute events overnight per nursing staff.  Discharge Exam: Vitals:   06/14/23 0321 06/14/23 0805  BP: 121/64 107/89  Pulse: 69 75  Resp: 18 15  Temp: 98.6 F (37 C) 98.5 F (36.9 C)  SpO2: 96% 99%   Vitals:   06/13/23 1953 06/13/23 2314 06/14/23 0321 06/14/23 0805  BP: 129/75 126/79 121/64 107/89  Pulse: 80 80 69 75  Resp: 18 17 18 15   Temp: 98.6 F (37 C) 98.4 F (36.9 C) 98.6 F (37 C) 98.5 F (36.9 C)  TempSrc: Oral Oral Oral Oral  SpO2: 99% 97% 96% 99%  Weight:      Height:        Physical Exam: GEN: NAD, alert and oriented x 3, obese, chronically ill in appearance, appears older than stated age HEENT: NCAT, PERRL, EOMI, sclera clear, MMM PULM: CTAB w/o wheezes/crackles, normal respiratory effort, on room air CV: RRR w/o M/G/R GI: abd soft, NTND, NABS, no R/G/M MSK: no peripheral edema, moves all extremities independently NEURO: CN II-XII intact, no focal deficits, sensation to light touch intact PSYCH: Depressed mood, flat affect    The results of significant diagnostics from this hospitalization (including imaging, microbiology, ancillary and laboratory) are listed below for reference.     Microbiology: No results found for this or any previous visit (from the past 240 hour(s)).   Labs: BNP (last 3 results) No results for input(s): "BNP" in the last 8760 hours. Basic Metabolic Panel: Recent Labs  Lab 06/08/23 0307 06/09/23 0803 06/12/23 0501  NA 136 136  136  K 3.5 3.9 3.5  CL 102 100 98  CO2 24 26 24   GLUCOSE 107* 97 112*  BUN 23 22 26*  CREATININE 1.46* 1.56* 1.68*  CALCIUM 8.7* 8.8* 8.8*  MG 2.0 2.1  --    Liver Function Tests: No results for input(s): "AST", "ALT", "ALKPHOS", "BILITOT", "PROT", "ALBUMIN" in the last 168 hours. No results for input(s): "LIPASE", "AMYLASE" in the last 168 hours. No results for input(s): "AMMONIA" in the last 168 hours. CBC: Recent Labs  Lab 06/08/23 0307 06/09/23 0803  WBC 8.5 7.7  HGB 8.4* 9.3*  HCT 25.4* 28.1*  MCV 89.4 90.6  PLT 353 318   Cardiac Enzymes: No results for input(s): "CKTOTAL", "CKMB", "CKMBINDEX", "TROPONINI" in the last 168 hours. BNP: Invalid input(s): "POCBNP" CBG: Recent Labs  Lab 06/13/23 0618 06/13/23 1215 06/13/23 1634 06/13/23 2102 06/14/23 0604  GLUCAP 132* 126* 111* 95 116*   D-Dimer No results for input(s): "DDIMER" in the last 72 hours. Hgb A1c No results for input(s): "HGBA1C" in the last 72 hours. Lipid Profile No results for input(s): "CHOL", "HDL", "LDLCALC", "TRIG", "CHOLHDL", "LDLDIRECT" in the last 72 hours.  Thyroid function studies No results for input(s): "TSH", "T4TOTAL", "T3FREE", "THYROIDAB" in the last 72 hours.  Invalid input(s): "FREET3" Anemia work up No results for input(s): "VITAMINB12", "FOLATE", "FERRITIN", "TIBC", "IRON", "RETICCTPCT" in the last 72 hours. Urinalysis    Component Value Date/Time   COLORURINE YELLOW (A) 05/29/2023 1941   APPEARANCEUR CLOUDY (A) 05/29/2023 1941   APPEARANCEUR Cloudy (A) 04/20/2021 1622   LABSPEC 1.005 05/29/2023 1941   PHURINE 7.0 05/29/2023 1941   GLUCOSEU NEGATIVE 05/29/2023 1941   HGBUR SMALL (A) 05/29/2023 1941   BILIRUBINUR NEGATIVE 05/29/2023 1941   BILIRUBINUR Negative 04/20/2021 1622   KETONESUR NEGATIVE 05/29/2023 1941   PROTEINUR NEGATIVE 05/29/2023 1941   NITRITE POSITIVE (A) 05/29/2023 1941   LEUKOCYTESUR LARGE (A) 05/29/2023 1941   Sepsis Labs Recent Labs  Lab  06/08/23 0307 06/09/23 0803  WBC 8.5 7.7   Microbiology No results found for this or any previous visit (from the past 240 hour(s)).   Time coordinating discharge: Over 30 minutes  SIGNED:   Alvira Philips [place], DO  [facility] 06/14/2023, 10:41 AM

## 2023-06-14 NOTE — TOC Transition Note (Signed)
Transition of Care [facility]) - CM/SW Discharge Note   Patient Details  Name: Clarence Hamilton MRN: [id number] Date of Birth: 19-Sep-1956  Transition of [facility] CM/SW Contact:  Carmina Miller, LCSWA Phone Number: 06/14/2023, 6:54 PM   Clinical Narrative:     CSW received request from [facility] Supervisor JS that pt needed a taxi from [facility] to [facility] where his keys are, then pt would need to be taken home via cab. Per RN, pt will be ready by 8:00 pm, pick up time set for 8:00 pm at entrance A, vouchers emailed to RN who will print them out and give to pt.   Final next level of care: Home w Home Health Services Barriers to Discharge: Other (must enter comment) (unable to locate patient's wallet and keys)   Patient Goals and CMS Choice      Discharge Placement                  Patient to be transferred to facility by: Cab voucher Name of family member notified: Brad Patient and family notified of of transfer: 06/14/23  Discharge Plan and Services Additional resources added to the After Visit Summary for                  DME Arranged: Walker rolling DME Agency: [employer] Date DME Agency Contacted: 06/14/23     HH Arranged: PT, OT, Nurse's Aide, Social Work [employer] Agency: [facility] Date HH Agency Contacted: 06/14/23      Social Determinants of Health (SDOH) Interventions SDOH Screenings   Food Insecurity: No Food Insecurity (06/04/2023)  Housing: Patient Declined (06/04/2023)  Transportation Needs: No Transportation Needs (06/04/2023)  Utilities: At Risk (06/04/2023)  Alcohol Screen: Low Risk  (02/26/2022)  Depression (PHQ2-9): Medium Risk (02/26/2022)  Financial Resource Strain: Low Risk  (10/22/2021)  Physical Activity: Inactive (03/25/2020)  Social Connections: Socially Isolated (03/25/2020)  Stress: No Stress Concern Present (03/25/2020)  Tobacco Use: Low Risk  (06/04/2023)     Readmission Risk Interventions     No data to display

## 2023-06-14 NOTE — Plan of Care (Signed)
  Problem: Education: Goal: Knowledge of General Education information will improve Description: Including pain rating scale, medication(s)/side effects and non-pharmacologic comfort measures Outcome: Progressing   Problem: Nutrition: Goal: Adequate nutrition will be maintained Outcome: Progressing   Problem: Safety: Goal: Ability to remain free from injury will improve Outcome: Progressing   

## 2023-06-14 NOTE — Progress Notes (Signed)
Patient discharged home with home health. Social worker provided cab to [facility] to pick up keys and wallet, then Home. AVS printed and reviewed. All questions/ concerns answered. Patient placed in wheelchair with home walker.

## 2023-06-14 NOTE — Discharge Instructions (Signed)
 FURTHER DISCHARGE INSTRUCTIONS:   Get Medicines reviewed and adjusted: Please take all your medications with you for your next visit with your Primary MD   Laboratory/radiological data: Please request your Primary MD to go over all hospital tests and procedure/radiological results at the follow up, please ask your Primary MD to get all Hospital records sent to his/her office.   In some cases, they will be blood work, cultures and biopsy results pending at the time of your discharge. Please request that your primary care M.D. goes through all the records of your hospital data and follows up on these results.   Also Note the following: If you experience worsening of your admission symptoms, develop shortness of breath, life threatening emergency, suicidal or homicidal thoughts you must seek medical attention immediately by calling 911 or calling your MD immediately  if symptoms less severe.   You must read complete instructions/literature along with all the possible adverse reactions/side effects for all the Medicines you take and that have been prescribed to you. Take any new Medicines after you have completely understood and accpet all the possible adverse reactions/side effects.    Do not drive when taking Pain medications or sleeping medications (Benzodaizepines)   Do not take more than prescribed Pain, Sleep and Anxiety Medications. It is not advisable to combine anxiety,sleep and pain medications without talking with your primary care practitioner   Special Instructions: If you have smoked or chewed Tobacco  in the last 2 yrs please stop smoking, stop any regular Alcohol  and or any Recreational drug use.   Wear Seat belts while driving.   Please note: You were cared for by a hospitalist during your hospital stay. Once you are discharged, your primary care physician will handle any further medical issues. Please note that NO REFILLS for any discharge medications will be authorized once  you are discharged, as it is imperative that you return to your primary care physician (or establish a relationship with a primary care physician if you do not have one) for your post hospital discharge needs so that they can reassess your need for medications and monitor your lab values.

## 2023-06-14 NOTE — Progress Notes (Signed)
Occupational Therapy Treatment Patient Details Name: Clarence Hamilton MRN: [id number] DOB: 1956-05-27 Today's Date: 06/14/2023   History of present illness Pt is a 67 yo male that [facility] on [facility]4 for AMS. Workup for acute toxic metabolic encephalopathy, sepsis secondary to UTI, seizure like activity, AKI. Urine drug screen positive for opiates benzos and cannabis. Pt transferred to [facility] for continuous EEG on 8/8. PMH of asthma/COPD, HTN, prediabetes, HLD, chronic pain syndrome, anxiety, depression.   OT comments  Pt extremely anxious this date with constant self-distracting thoughts and needed maximal cues for re-direction to functional tasks and education. Overall he demonstrated superivsion A for bed mobility and CGA for transfers and mobility with RW. Pt continues to have low vision and is sensitive to light, educated pt on the importance to have well-lit paths when he is mobilizing at home.  Reviewed energy conservation techniques with very limited recall due to poor attention. Pt with concerns about getting into his house due to lost keys (belongings bag at [facility]?). Per chart review, pt was not approved for SNF therefore he has plans to d/c home with home health services.      If plan is discharge home, recommend the following:  A little help with walking and/or transfers;A little help with bathing/dressing/bathroom;Assistance with cooking/housework;Direct supervision/assist for financial management;Direct supervision/assist for medications management;Assist for transportation;Help with stairs or ramp for entrance;Supervision due to cognitive status   Equipment Recommendations  Other (comment)       Precautions / Restrictions Precautions Precautions: Fall Restrictions Weight Bearing Restrictions: No       Mobility Bed Mobility Overal bed mobility: Needs Assistance Bed Mobility: Supine to Sit, Sit to Supine     Supine to sit: Supervision Sit to supine: Contact guard  assist        Transfers Overall transfer level: Needs assistance Equipment used: Rolling walker (2 wheels) Transfers: Sit to/from Stand Sit to Stand: Contact guard assist           General transfer comment: cues for hand position     Balance Overall balance assessment: Needs assistance Sitting-balance support: No upper extremity supported, Feet supported Sitting balance-Leahy Scale: Good     Standing balance support: During functional activity, No upper extremity supported Standing balance-Leahy Scale: Fair                             ADL either performed or assessed with clinical judgement   ADL Overall ADL's : Needs assistance/impaired;Independent                         Toilet Transfer: Rolling walker (2 wheels);Ambulation Toilet Transfer Details (indicate cue type and reason): slow, deliberate ambulation         Functional mobility during ADLs: Contact guard assist;Rolling walker (2 wheels) General ADL Comments: pt needs constant redirection to task    Extremity/Trunk Assessment Upper Extremity Assessment Upper Extremity Assessment: Generalized weakness   Lower Extremity Assessment Lower Extremity Assessment: Defer to PT evaluation        Vision   Vision Assessment?: Vision impaired- to be further tested in functional context Additional Comments: vision has improved but pt still reports blurry R eye   Perception Perception Perception: Within Functional Limits   Praxis Praxis Praxis: WFL    Cognition Arousal: Alert Behavior During Therapy: WFL for tasks assessed/performed Overall Cognitive Status: No family/caregiver present to determine baseline cognitive functioning  General Comments: On the phone upon arrival, pt expressing concerns about low vision. He is hyperverbose and needs re-direction. Pt reporting feeling very anxious and stressed              General Comments VSS on RA    Pertinent Vitals/  Pain    Frequency  Min 1X/week        Progress Toward Goals  OT Goals(current goals can now be found in the care plan section)  Progress towards OT goals: Progressing toward goals  Acute Rehab OT Goals Patient Stated Goal: to go  home OT Goal Formulation: Patient unable to participate in goal setting Time For Goal Achievement: 06/20/23 Potential to Achieve Goals: Good ADL Goals Pt Will Perform Lower Body Dressing: with supervision;sitting/lateral leans Pt Will Transfer to Toilet: stand pivot transfer;with min assist Additional ADL Goal #1: Pt will complete all BADLs with mod I Additional ADL Goal #2: Pt will indep complete IADL medication management task         AM-PAC OT "6 Clicks" Daily Activity     Outcome Measure   Help from another person eating meals?: None Help from another person taking care of personal grooming?: A Little Help from another person toileting, which includes using toliet, bedpan, or urinal?: A Little Help from another person bathing (including washing, rinsing, drying)?: A Little Help from another person to put on and taking off regular upper body clothing?: A Little Help from another person to put on and taking off regular lower body clothing?: A Little 6 Click Score: 19    End of Session Equipment Utilized During Treatment: Gait belt;Rolling walker (2 wheels)  OT Visit Diagnosis: Other abnormalities of gait and mobility (R26.89);Unsteadiness on feet (R26.81);Muscle weakness (generalized) (M62.81);Cognitive communication deficit (R41.841)   Activity Tolerance Patient tolerated treatment well   Patient Left with call bell/phone within reach;in bed;with bed alarm set   Nurse Communication Mobility status        Time: 1610-9604 OT Time Calculation (min): 21 min  Charges: OT Treatments $Therapeutic Activity: 8-22 mins  Clarence Hamilton, OTR/L Acute Rehabilitation Services Office [phone] Secure Chat Communication  Preferred   Clarence Hamilton 06/14/2023, 3:38 PM

## 2023-06-15 ENCOUNTER — Telehealth: Payer: Self-pay

## 2023-06-15 NOTE — Transitions of Care (Post Inpatient/ED Visit) (Signed)
   06/15/2023  Name: Clarence Hamilton MRN: [id number] DOB: 23-Mar-1956  Today's TOC FU Call Status: Today's TOC FU Call Status:: Successful TOC FU Call Completed TOC FU Call Complete Date: 06/15/23  Transition Care Management Follow-up Telephone Call Date of Discharge: 06/14/23 Discharge Facility: [facility]) Type of Discharge: Inpatient Admission Primary Inpatient Discharge Diagnosis:: Sepsis Secondary to Urinary Tract Infection How have you been since you were released from the hospital?: Better Any questions or concerns?: Yes Patient Questions/Concerns:: Patient's Aunt is concerned about the patient's living situation.  Her nephew has contacted APS to report the living conditions which include having a dead bird in his house, animal feces everywhere and spoiled food. Patient Questions/Concerns Addressed: Other: (Referring to LCSW)  Items Reviewed: Did you receive and understand the discharge instructions provided?: Yes Medications obtained,verified, and reconciled?: No (Patients Aunt was unable to verify) Medications Not Reviewed Reasons:: Notified Provider of Medication Management Concern Any new allergies since your discharge?: No Dietary orders reviewed?: No Do you have support at home?: No  Medications Reviewed Today: Medications Reviewed Today   Medications were not reviewed in this encounter     Home Care and Equipment/Supplies: Were Home Health Services Ordered?: Yes Name of Home Health Agency:: [facility] Has Agency set up a time to come to your home?: No EMR reviewed for Home Health Orders:  (Patients phone disconnected) Any new equipment or medical supplies ordered?: No  Functional Questionnaire: Do you need assistance with bathing/showering or dressing?: Yes (Per Aunt, he does not shower regularly) Do you need assistance with meal preparation?: No Do you need assistance with eating?: No Do you have difficulty maintaining continence: No Do you need  assistance with getting out of bed/getting out of a chair/moving?: No Do you have difficulty managing or taking your medications?: Yes  Follow up appointments reviewed: PCP Follow-up appointment confirmed?: No (Patients POA is going to call to schedule HFU) Specialist Hospital Follow-up appointment confirmed?: NA Do you need transportation to your follow-up appointment?: No Do you understand care options if your condition(s) worsen?: Yes-patient verbalized understanding   Interventions Today    Flowsheet Row Most Recent Value  Mental Health Interventions   Mental Health Discussed/Reviewed Mental Health Discussed, Refer to Social Work for resources  [employer], Nida Boatman has called in an APS report due to patients living conditions]       TOC Interventions Today    Flowsheet Row Most Recent Value  TOC Interventions   TOC Interventions Discussed/Reviewed TOC Interventions Discussed, TOC Interventions Reviewed, Contacted provider for patient needs     Scheduled with [employer], LCSW for 06/21/23@3 :00.   Jodelle Gross RN, BSN, CCM [facility] RN Care Coordinator/ Transitions of Care Direct Dial: [phone]  Fax: [phone]

## 2023-06-15 NOTE — Transitions of Care (Post Inpatient/ED Visit) (Signed)
   06/15/2023  Name: Kenzel Azucena Sardina MRN: [id number] DOB: 16-Feb-1956  Today's TOC FU Call Status: Today's TOC FU Call Status:: Unsuccessful Call (1st Attempt) Unsuccessful Call (1st Attempt) Date: 06/15/23 (Patients phone is disconnected, apptempted his Uncle, no answer.)  Attempted to reach the patient regarding the most recent Inpatient/ED visit.  Follow Up Plan: Additional outreach attempts will be made to reach the patient to complete the Transitions of Care (Post Inpatient/ED visit) call.   Jodelle Gross RN, BSN, CCM [facility] RN Care Coordinator/ Transitions of Care Direct Dial: [phone]  Fax: [phone]

## 2023-06-15 NOTE — Telephone Encounter (Signed)
Copied from CRM [id number]. Topic: General - Inquiry >> Jun 15, 2023  3:45 PM De Blanch wrote: Reason for CRM:Pt uncle stated he is returning a missed call from this morning. Please advise.

## 2023-06-21 ENCOUNTER — Ambulatory Visit: Payer: Self-pay | Admitting: *Deleted

## 2023-06-21 NOTE — Patient Instructions (Signed)
Visit Information  Thank you for taking time to visit with me today. Please don't hesitate to contact me if I can be of assistance to you.   Following are the goals we discussed today:  Please continue clean up efforts for safety in your home Please bring medication list to next appointment with PCP for review Please follow up with Adult Protective Services as needed   Our next appointment is by telephone on 07/05/23 at 3pm  Please call the care guide team at [phone] if you need to cancel or reschedule your appointment.   If you are experiencing a Mental Health or Behavioral Health Crisis or need someone to talk to, please call 911   Patient verbalizes understanding of instructions and care plan provided today and agrees to view in MyChart. Active MyChart status and patient understanding of how to access instructions and care plan via MyChart confirmed with patient.     Telephone follow up appointment with care management team member scheduled for: 07/05/23   Verna Czech, LCSW Clinical Social Worker  [phone]

## 2023-06-21 NOTE — Patient Outreach (Signed)
  Care Coordination   Initial Visit Note   06/21/2023 Name: Clarence Hamilton MRN: [id number] DOB: 1955-12-10  Clarence Hamilton is a 67 y.o. year old male who sees Fisher, Demetrios Isaacs, MD for primary care. I spoke with  Clarence Hamilton by phone today.  What matters to the patients health and wellness today?  Patient discharged from the hospital and returned to a home that needed a deep cleaning. POA contacted Adult Protective Services to address the condition of the home and offer resources. Patient confirms clean up plan, friends/family assisting with the clean up. Patient understands that clear pathways are needed for safety and for [facility] agencies to provide care. Patient accepting of assistance from family and friends. Declines need for additional resources. Patient confirms that Clarence Hamilton is POA and gives permission to contact.  VM left for a return call   Goals Addressed             This Visit's Progress    Care coordination activities       Interventions Today    Flowsheet Row Most Recent Value  Chronic Disease   Chronic disease during today's visit Hypertension (HTN)  [Family currently helping with the clean up of pt's home-pt states that his anxiety affected his ability to manage his home  he is working room to room to clear the home]  General Interventions   General Interventions Discussed/Reviewed General Interventions Discussed, Community Resources  [confirmed clean up plan for patient's home, patient going room by room taking frequent rest breaks, family/friends assisting with clean up as well]  Mental Health Interventions   Mental Health Discussed/Reviewed Mental Health Discussed, Anxiety, Coping Strategies  [patient reports symptoms of anxiety- medication managed by PCP]  Nutrition Interventions   Nutrition Discussed/Reviewed Nutrition Discussed  [patient reports 3 meals a day, no sweets, only drinks water]  Pharmacy Interventions   Pharmacy Dicussed/Reviewed Pharmacy  Topics Discussed, Medication Adherence  [has all medications but one-cannot remember which one,  will plan take medication list to PCP for review]  Safety Interventions   Safety Discussed/Reviewed Safety Discussed, Home Safety  Home Safety Refer for community resources  [pt now clearing home  with the help of friends/fam-walk ways will need to be cleared before [facility] will come out. APS visited last week, supposed to return to provide resources has no hot water-working on getting hot water heater-encouraged accepting asst.]  Advanced Directive Interventions   Advanced Directives Discussed/Reviewed --  [patient confirms that Clarence Hamilton is his POA, no supporting documents found, patient gave verbal permission to  speak to Brad Hamilton by phone]              SDOH assessments and interventions completed:  Yes  SDOH Interventions Today    Flowsheet Row Most Recent Value  SDOH Interventions   Food Insecurity Interventions Intervention Not Indicated  Housing Interventions Intervention Not Indicated  Transportation Interventions Intervention Not Indicated  Clarence Hamilton provided transportation]        Care Coordination Interventions:  Yes, provided   Follow up plan: Follow up call scheduled for 07/15/23    Encounter Outcome:  Pt. Visit Completed

## 2023-06-24 ENCOUNTER — Telehealth: Payer: Self-pay | Admitting: *Deleted

## 2023-06-24 NOTE — Patient Outreach (Signed)
  Care Coordination   Follow Up Visit Note   06/24/2023 Name: Tishawn Schaefer MRN: [id number] DOB: 03-Mar-1956  Alwyn Ren Rebuck is a 67 y.o. year old male who sees Fisher, Demetrios Isaacs, MD for primary care. I spoke with  Alwyn Ren Towery 's POA Brad Chrismon by phone today.  What matters to the patients health and wellness today?  CSW spoke with patient's POA to discuss challenges with patient's home. Safety concerns discussed, APS involved. Ongoing mental health follow up recommended, resources to be provided. Outside assistance with clean up also discussed. POA paperwork provided and will be scanned to patient's chart.    Goals Addressed             This Visit's Progress    Care coordination activities       Interventions Today    Flowsheet Row Most Recent Value  Chronic Disease   Chronic disease during today's visit Hypertension (HTN)  General Interventions   General Interventions Discussed/Reviewed General Interventions Discussed, General Interventions Reviewed  [POA discussed challenges with patient's hoarding, currently working on clearing the house  Patient's parents passed away house over filled with "items" safety concerns and options to address concerns discussed]  Education Interventions   Education Provided Provided Education  Provided Verbal Education On [employer]  [discussed possiblity of arranging outside help to assist with clearing of patient's home-discussed importance of close mental health follow up for maintenance]  Mental Health Interventions   Mental Health Discussed/Reviewed Mental Health Discussed, Coping Strategies  [Discussed current plan to assign ppecific tasks to complete to continue progress of clean up- ie 15 trash bags per day, Mental health follow up to be coordinated]  Safety Interventions   Safety Discussed/Reviewed Safety Discussed, Home Safety  Advanced Directive Interventions   Advanced Directives Discussed/Reviewed Guardianship   Guardianship --  [patient's POA confirms no plans to apply for guardianship at this time]              SDOH assessments and interventions completed:  No     Care Coordination Interventions:  Yes, provided   Follow up plan: Follow up call scheduled for 07/05/23    Encounter Outcome:  Pt. Visit Completed

## 2023-06-24 NOTE — Patient Instructions (Signed)
Visit Information  Thank you for taking time to visit with me today. Please don't hesitate to contact me if I can be of assistance to you.   Following are the goals we discussed today:  CSW to provide patient and POA with options for mental health follow up  POA will continue to consider hiring outside help to assist with clearing patient's home   Our next appointment is by telephone on 07/05/23 at 3pm  Please call the care guide team at [phone] if you need to cancel or reschedule your appointment.   If you are experiencing a Mental Health or Behavioral Health Crisis or need someone to talk to, please call 911   Patient verbalizes understanding of instructions and care plan provided today and agrees to view in MyChart. Active MyChart status and patient understanding of how to access instructions and care plan via MyChart confirmed with patient.     Telephone follow up appointment with care management team member scheduled for: 07/05/23  Verna Czech, LCSW Clinical Social Worker  [phone]

## 2023-06-29 ENCOUNTER — Encounter: Payer: Self-pay | Admitting: Family Medicine

## 2023-06-29 ENCOUNTER — Ambulatory Visit (INDEPENDENT_AMBULATORY_CARE_PROVIDER_SITE_OTHER): Payer: Medicare HMO | Admitting: Family Medicine

## 2023-06-29 VITALS — BP 131/75 | HR 101 | Temp 98.2°F | Resp 16 | Ht 68.0 in | Wt 204.5 lb

## 2023-06-29 DIAGNOSIS — S0501XS Injury of conjunctiva and corneal abrasion without foreign body, right eye, sequela: Secondary | ICD-10-CM

## 2023-06-29 DIAGNOSIS — R7303 Prediabetes: Secondary | ICD-10-CM | POA: Diagnosis not present

## 2023-06-29 DIAGNOSIS — F39 Unspecified mood [affective] disorder: Secondary | ICD-10-CM | POA: Diagnosis not present

## 2023-06-29 DIAGNOSIS — E440 Moderate protein-calorie malnutrition: Secondary | ICD-10-CM

## 2023-06-29 DIAGNOSIS — I10 Essential (primary) hypertension: Secondary | ICD-10-CM

## 2023-06-29 DIAGNOSIS — R972 Elevated prostate specific antigen [PSA]: Secondary | ICD-10-CM

## 2023-06-29 DIAGNOSIS — N1831 Chronic kidney disease, stage 3a: Secondary | ICD-10-CM

## 2023-06-29 DIAGNOSIS — Z59819 Housing instability, housed unspecified: Secondary | ICD-10-CM | POA: Insufficient documentation

## 2023-06-29 DIAGNOSIS — B356 Tinea cruris: Secondary | ICD-10-CM

## 2023-06-29 DIAGNOSIS — I700 Atherosclerosis of aorta: Secondary | ICD-10-CM | POA: Diagnosis not present

## 2023-06-29 DIAGNOSIS — J849 Interstitial pulmonary disease, unspecified: Secondary | ICD-10-CM | POA: Diagnosis not present

## 2023-06-29 DIAGNOSIS — E782 Mixed hyperlipidemia: Secondary | ICD-10-CM

## 2023-06-29 DIAGNOSIS — Z1211 Encounter for screening for malignant neoplasm of colon: Secondary | ICD-10-CM | POA: Insufficient documentation

## 2023-06-29 DIAGNOSIS — J4540 Moderate persistent asthma, uncomplicated: Secondary | ICD-10-CM

## 2023-06-29 DIAGNOSIS — S0501XA Injury of conjunctiva and corneal abrasion without foreign body, right eye, initial encounter: Secondary | ICD-10-CM | POA: Insufficient documentation

## 2023-06-29 DIAGNOSIS — A630 Anogenital (venereal) warts: Secondary | ICD-10-CM

## 2023-06-29 MED ORDER — FLUTICASONE-SALMETEROL 100-50 MCG/ACT IN AEPB: 1.0000 | INHALATION_SPRAY | Freq: Two times a day (BID) | RESPIRATORY_TRACT | 3 refills | Status: DC

## 2023-06-29 MED ORDER — TAMSULOSIN HCL 0.4 MG PO CAPS: 0.4000 mg | ORAL_CAPSULE | Freq: Every day | ORAL | 0 refills | Status: DC

## 2023-06-29 MED ORDER — PREDNISOLONE ACETATE 1 % OP SUSP: 1.0000 [drp] | Freq: Four times a day (QID) | OPHTHALMIC | 0 refills | Status: AC

## 2023-06-29 MED ORDER — NYSTATIN-TRIAMCINOLONE 100000-0.1 UNIT/GM-% EX OINT: 1.0000 | TOPICAL_OINTMENT | Freq: Two times a day (BID) | CUTANEOUS | 0 refills | Status: AC

## 2023-06-29 MED ORDER — LOSARTAN POTASSIUM 25 MG PO TABS: 25.0000 mg | ORAL_TABLET | Freq: Every day | ORAL | 0 refills | Status: DC

## 2023-06-29 MED ORDER — DULOXETINE HCL 20 MG PO CPEP: 20.0000 mg | ORAL_CAPSULE | Freq: Every day | ORAL | 0 refills | Status: DC

## 2023-06-29 NOTE — Assessment & Plan Note (Signed)
Chronic, stable Refills provided

## 2023-06-29 NOTE — Assessment & Plan Note (Signed)
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

## 2023-06-29 NOTE — Progress Notes (Signed)
Established patient visit   Patient: Clarence Hamilton   DOB: [birth year]   67 y.o. Male  MRN: [id number] Visit Date: 06/29/2023  Today's healthcare provider: Jacky Kindle, FNP  Introduced to nurse practitioner role and practice setting.  All questions answered.  Discussed provider/patient relationship and expectations.  Subjective    HPI HPI     Hospitalization Follow-up    Additional comments: 05/29/23-06/02/23 Transfer to [facility] for long-term EEG monitoring. Discharge date: 06/14/2023       Last edited by Myles Lipps, CMA on 06/29/2023  4:00 PM.      Follow up Hospitalization  Patient was admitted to [facility] with transfer to [facility] on 8/4 and discharged on 8/20. He was treated for delirium and sepsis s/s UTI. Treatment for this included start of valium to assist with mood disorder. Telephone follow up was done on 8/21 He reports fair compliance with treatment. He reports this condition is stayed the same.  ----------------------------------------------------------------------------------------- -  Medications: Outpatient Medications Prior to Visit  Medication Sig   diazepam (VALIUM) 5 MG tablet Take 3 tablets (15 mg total) by mouth 3 (three) times daily.   diltiazem (CARDIZEM CD) 120 MG 24 hr capsule Take 1 capsule (120 mg total) by mouth daily.   fluticasone (FLONASE) 50 MCG/ACT nasal spray Place 1 spray into both nostrils daily.   hydrochlorothiazide (HYDRODIURIL) 12.5 MG tablet Take 1 tablet (12.5 mg total) by mouth daily.   loratadine (CLARITIN) 10 MG tablet Take 1 tablet (10 mg total) by mouth daily.   magnesium oxide (MAG-OX) 400 (240 Mg) MG tablet Take 1 tablet (400 mg total) by mouth 2 (two) times daily.   montelukast (SINGULAIR) 10 MG tablet Take 1 tablet (10 mg total) by mouth at bedtime.   Multiple Vitamins-Minerals (CENTRUM SILVER PO) Take 1 tablet by mouth daily.   albuterol (VENTOLIN HFA) 108 (90 Base) MCG/ACT inhaler Inhale 1 puff into the  lungs every 6 (six) hours as needed.   rosuvastatin (CRESTOR) 20 MG tablet Take 1 tablet (20 mg total) by mouth daily.   [DISCONTINUED] losartan (COZAAR) 100 MG tablet Take 1 tablet (100 mg total) by mouth daily.   [DISCONTINUED] naloxone (NARCAN) nasal spray 4 mg/0.1 mL Narcan 4 mg/actuation nasal spray  Take by nasal route every 3 minutes until patient awakes or EMS arrives.   [DISCONTINUED] naphazoline-glycerin (CLEAR EYES REDNESS) 0.012-0.25 % SOLN Place 1-2 drops into both eyes 4 (four) times daily as needed for eye irritation.   No facility-administered medications prior to visit.    Review of Systems   Objective    BP 131/75 (BP Location: Left Arm, Patient Position: Sitting, Cuff Size: Large)   Pulse (!) 101   Temp 98.2 F (36.8 C) (Temporal)   Resp 16   Ht 5\' 8"  (1.727 m)   Wt 204 lb 8 oz (92.8 kg)   SpO2 96%   BMI 31.09 kg/m   Physical Exam Vitals and nursing note reviewed.  Constitutional:      Appearance: Normal appearance. He is obese.  HENT:     Head: Normocephalic and atraumatic.  Cardiovascular:     Rate and Rhythm: Regular rhythm. Tachycardia present.     Pulses: Normal pulses.     Heart sounds: Normal heart sounds.  Pulmonary:     Effort: Pulmonary effort is normal.     Breath sounds: Normal breath sounds.  Musculoskeletal:        General: Normal range of motion.  Cervical back: Normal range of motion.  Skin:    General: Skin is warm and dry.     Capillary Refill: Capillary refill takes less than 2 seconds.  Neurological:     General: No focal deficit present.     Mental Status: He is alert and oriented to person, place, and time. Mental status is at baseline.  Psychiatric:        Mood and Affect: Mood normal.        Behavior: Behavior normal.        Thought Content: Thought content normal.        Judgment: Judgment normal.     No results found for any visits on 06/29/23.  Assessment & Plan     Problem List Items Addressed This Visit        Cardiovascular and Mediastinum   Aortic atherosclerosis (HCC)    Chronic, transitioned to Crestor 20 mg Pt without complaints Repeat LP to establish new baseline LDL goal of 55-70      Relevant Medications   losartan (COZAAR) 25 MG tablet   Other Relevant Orders   Comprehensive Metabolic Panel (CMET)   TSH   CBC with Differential/Platelet   Lipid panel   Hemoglobin A1c   Cologuard   PSA   Hepatitis C Antibody   Hypertension    Chronic, stable Continues on dilt 120 mg, hydrochlorothiazide 12.5 mg and reports he did not receive losartan 100 mg Recommend low start of ARB at 25 mg with 1 month f/u Will grab CMP at this time      Relevant Medications   losartan (COZAAR) 25 MG tablet   Other Relevant Orders   Comprehensive Metabolic Panel (CMET)   TSH   CBC with Differential/Platelet   Lipid panel   Hemoglobin A1c   Cologuard   PSA   Hepatitis C Antibody     Respiratory   ILD (interstitial lung disease) (HCC)    Chronic, stable F/b pulm previously No adventitious lung sounds appreciated Continue to monitor allergic rhinitis and respiratory concerns Pt notes some PND since return home; reports access to supplement O2 Consider PSG for OSA at later date      Relevant Orders   Comprehensive Metabolic Panel (CMET)   TSH   CBC with Differential/Platelet   Lipid panel   Hemoglobin A1c   Cologuard   PSA   Hepatitis C Antibody   Ambulatory referral to ENT   Moderate persistent asthma without complication    Chronic, stable Refills provided      Relevant Medications   fluticasone-salmeterol (ADVAIR) 100-50 MCG/ACT AEPB   Other Relevant Orders   Ambulatory referral to ENT     Musculoskeletal and Integument   Fungal infection of the groin    L groin; subacute, minor, self limiting Topical cream provided; pt reports use of powder during hospital stay without stabilization Referral to derm for further assessment given concurrent genital warts at same location       Relevant Medications   nystatin-triamcinolone ointment (MYCOLOG)   Other Relevant Orders   Ambulatory referral to Dermatology   Genital warts    Acute, self limiting Referral to derm to assist with cryotherapy if desired      Relevant Medications   nystatin-triamcinolone ointment (MYCOLOG)   Other Relevant Orders   Ambulatory referral to Dermatology     Genitourinary   Chronic kidney disease, stage 3 unspecified (HCC)    Chronic, recent variation in eGFR Repeat CMP continue to monitor BP and prediabetes  to assist       Relevant Orders   Comprehensive Metabolic Panel (CMET)   TSH   CBC with Differential/Platelet   Lipid panel   Hemoglobin A1c   Cologuard   PSA   Hepatitis C Antibody     Other   Elevated PSA    Chronic; with complaints of worsening LUTS since no restart of flomax Reports nocturia given increased water intake Repeat PSA at this time; consider DRE at later date      Relevant Orders   Comprehensive Metabolic Panel (CMET)   TSH   CBC with Differential/Platelet   Lipid panel   Hemoglobin A1c   Cologuard   PSA   Hepatitis C Antibody   Housing instability, housed unspecified    Utility instability noted; referral placed to assist      Relevant Orders   Ambulatory referral to Social Work   Hyperlipidemia, mixed    Chronic, repeat LP recommend diet low in saturated fat and regular exercise - 30 min at least 5 times per week Now on high dose statin given known arteriosclerosis       Relevant Medications   losartan (COZAAR) 25 MG tablet   Other Relevant Orders   Comprehensive Metabolic Panel (CMET)   TSH   CBC with Differential/Platelet   Lipid panel   Hemoglobin A1c   Cologuard   PSA   Hepatitis C Antibody   Malnutrition of moderate degree    Chronic; continue to monitor albumin levels with CMP      Relevant Orders   Comprehensive Metabolic Panel (CMET)   TSH   CBC with Differential/Platelet   Lipid panel   Hemoglobin A1c    Cologuard   PSA   Hepatitis C Antibody   Mood disorder (HCC) - Primary    Chronic, improved but reports feelings of fogginess and forgetfulness following abrupt discontinuation of Effexor Restart of low dose Cymbalta to assist chronic pain and assist in future wean of 15 mg valium TID PRN      Prediabetes    Chronic, repeat A1c Continue to recommend balanced, lower carb meals. Smaller meal size, adding snacks. Choosing water as drink of choice and increasing purposeful exercise. Not on Rx at this time      Relevant Orders   Comprehensive Metabolic Panel (CMET)   TSH   CBC with Differential/Platelet   Lipid panel   Hemoglobin A1c   Cologuard   PSA   Hepatitis C Antibody   Right corneal abrasion    Reports use of abx and hydrating eye drops; however, R eye remains blurred Trial of topical steroid gtts to assist Encourage f/u with opthal given ongoing concerns      Screen for colon cancer    Colo guard placed      Return in about 4 weeks (around 07/27/2023), or if symptoms worsen or fail to improve.     Addressed extensive list of chronic and acute medical problems today requiring 40 minutes reviewing his medical record, counseling patient regarding his conditions and coordination of care.    Leilani Merl, FNP, have reviewed all documentation for this visit. The documentation on 06/29/23 for the exam, diagnosis, procedures, and orders are all accurate and complete.  Jacky Kindle, FNP  [facility] [phone] (phone) [phone] (fax)  [facility]

## 2023-06-29 NOTE — Assessment & Plan Note (Signed)
Chronic, repeat LP recommend diet low in saturated fat and regular exercise - 30 min at least 5 times per week Now on high dose statin given known arteriosclerosis

## 2023-06-29 NOTE — Assessment & Plan Note (Signed)
Chronic; continue to monitor albumin levels with CMP

## 2023-06-29 NOTE — Assessment & Plan Note (Signed)
Chronic, stable F/b pulm previously No adventitious lung sounds appreciated Continue to monitor allergic rhinitis and respiratory concerns Pt notes some PND since return home; reports access to supplement O2 Consider PSG for OSA at later date

## 2023-06-29 NOTE — Assessment & Plan Note (Signed)
Chronic, recent variation in eGFR Repeat CMP continue to monitor BP and prediabetes to assist

## 2023-06-29 NOTE — Assessment & Plan Note (Signed)
Acute, self limiting Referral to derm to assist with cryotherapy if desired

## 2023-06-29 NOTE — Assessment & Plan Note (Signed)
Chronic, transitioned to Crestor 20 mg Pt without complaints Repeat LP to establish new baseline LDL goal of 55-70

## 2023-06-29 NOTE — Assessment & Plan Note (Signed)
Reports use of abx and hydrating eye drops; however, R eye remains blurred Trial of topical steroid gtts to assist Encourage f/u with opthal given ongoing concerns

## 2023-06-29 NOTE — Assessment & Plan Note (Signed)
Cologuard placed

## 2023-06-29 NOTE — Assessment & Plan Note (Signed)
Chronic, repeat A1c Continue to recommend balanced, lower carb meals. Smaller meal size, adding snacks. Choosing water as drink of choice and increasing purposeful exercise. Not on Rx at this time

## 2023-06-29 NOTE — Assessment & Plan Note (Signed)
Chronic; with complaints of worsening LUTS since no restart of flomax Reports nocturia given increased water intake Repeat PSA at this time; consider DRE at later date

## 2023-06-29 NOTE — Assessment & Plan Note (Signed)
Chronic, stable Continues on dilt 120 mg, hydrochlorothiazide 12.5 mg and reports he did not receive losartan 100 mg Recommend low start of ARB at 25 mg with 1 month f/u Will grab CMP at this time

## 2023-06-29 NOTE — Assessment & Plan Note (Signed)
Utility instability noted; referral placed to assist

## 2023-06-29 NOTE — Assessment & Plan Note (Signed)
Chronic, improved but reports feelings of fogginess and forgetfulness following abrupt discontinuation of Effexor Restart of low dose Cymbalta to assist chronic pain and assist in future wean of 15 mg valium TID PRN

## 2023-06-30 ENCOUNTER — Other Ambulatory Visit: Payer: Self-pay | Admitting: Family Medicine

## 2023-06-30 DIAGNOSIS — R972 Elevated prostate specific antigen [PSA]: Secondary | ICD-10-CM

## 2023-06-30 DIAGNOSIS — Z1211 Encounter for screening for malignant neoplasm of colon: Secondary | ICD-10-CM

## 2023-06-30 DIAGNOSIS — Z59819 Housing instability, housed unspecified: Secondary | ICD-10-CM

## 2023-06-30 DIAGNOSIS — N1832 Chronic kidney disease, stage 3b: Secondary | ICD-10-CM

## 2023-06-30 DIAGNOSIS — D509 Iron deficiency anemia, unspecified: Secondary | ICD-10-CM

## 2023-06-30 LAB — COMPREHENSIVE METABOLIC PANEL
ALT: 63 IU/L — ABNORMAL HIGH (ref 0–44)
AST: 36 IU/L (ref 0–40)
Albumin: 4.3 g/dL (ref 3.9–4.9)
Alkaline Phosphatase: 100 IU/L (ref 44–121)
BUN/Creatinine Ratio: 11 (ref 10–24)
BUN: 19 mg/dL (ref 8–27)
Bilirubin Total: <0.2 mg/dL (ref 0.0–1.2)
CO2: 26 mmol/L (ref 20–29)
Calcium: 9.8 mg/dL (ref 8.6–10.2)
Chloride: 101 mmol/L (ref 96–106)
Creatinine, Ser: 1.69 mg/dL — ABNORMAL HIGH (ref 0.76–1.27)
Globulin, Total: 2.6 g/dL (ref 1.5–4.5)
Glucose: 117 mg/dL — ABNORMAL HIGH (ref 70–99)
Potassium: 3.6 mmol/L (ref 3.5–5.2)
Sodium: 141 mmol/L (ref 134–144)
Total Protein: 6.9 g/dL (ref 6.0–8.5)
eGFR: 44 mL/min/{1.73_m2} — ABNORMAL LOW (ref >59)

## 2023-06-30 LAB — CBC WITH DIFFERENTIAL/PLATELET
Basophils Absolute: 0.1 10*3/uL (ref 0.0–0.2)
Basos: 1 %
EOS (ABSOLUTE): 0.3 10*3/uL (ref 0.0–0.4)
Eos: 3 %
Hematocrit: 33.5 % — ABNORMAL LOW (ref 37.5–51.0)
Hemoglobin: 10.6 g/dL — ABNORMAL LOW (ref 13.0–17.7)
Immature Grans (Abs): 0 10*3/uL (ref 0.0–0.1)
Immature Granulocytes: 0 %
Lymphocytes Absolute: 1.9 10*3/uL (ref 0.7–3.1)
Lymphs: 16 %
MCH: 29.8 pg (ref 26.6–33.0)
MCHC: 31.6 g/dL (ref 31.5–35.7)
MCV: 94 fL (ref 79–97)
Monocytes Absolute: 0.7 10*3/uL (ref 0.1–0.9)
Monocytes: 6 %
Neutrophils Absolute: 9.1 10*3/uL — ABNORMAL HIGH (ref 1.4–7.0)
Neutrophils: 74 %
Platelets: 314 10*3/uL (ref 150–450)
RBC: 3.56 x10E6/uL — ABNORMAL LOW (ref 4.14–5.80)
RDW: 13.6 % (ref 11.6–15.4)
WBC: 12.1 10*3/uL — ABNORMAL HIGH (ref 3.4–10.8)

## 2023-06-30 LAB — TSH: TSH: 0.665 u[IU]/mL (ref 0.450–4.500)

## 2023-06-30 LAB — LIPID PANEL
Chol/HDL Ratio: 3.3 ratio (ref 0.0–5.0)
Cholesterol, Total: 138 mg/dL (ref 100–199)
HDL: 42 mg/dL (ref >39)
LDL Chol Calc (NIH): 48 mg/dL (ref 0–99)
Triglycerides: 315 mg/dL — ABNORMAL HIGH (ref 0–149)
VLDL Cholesterol Cal: 48 mg/dL — ABNORMAL HIGH (ref 5–40)

## 2023-06-30 LAB — HEMOGLOBIN A1C
Est. average glucose Bld gHb Est-mCnc: 97 mg/dL
Hgb A1c MFr Bld: 5 % (ref 4.8–5.6)

## 2023-06-30 LAB — PSA: Prostate Specific Ag, Serum: 8.4 ng/mL — ABNORMAL HIGH (ref 0.0–4.0)

## 2023-06-30 LAB — HEPATITIS C ANTIBODY: Hep C Virus Ab: NONREACTIVE

## 2023-07-05 ENCOUNTER — Ambulatory Visit: Payer: Self-pay | Admitting: *Deleted

## 2023-07-05 NOTE — Patient Instructions (Signed)
Visit Information  Thank you for taking time to visit with me today. Please don't hesitate to contact me if I can be of assistance to you.   Following are the goals we discussed today:  Please continue to work with Adult Protective Services regarding any safety concerns. Please continue with efforts to clear your home as physically able, assistance to be provided as needed Please contact your provider with any questions regarding your medical needs   Our next appointment is by telephone on 07/19/23 at 3pm  Please call the care guide team at [phone] if you need to cancel or reschedule your appointment.   If you are experiencing a Mental Health or Behavioral Health Crisis or need someone to talk to, please call 911   Patient verbalizes understanding of instructions and care plan provided today and agrees to view in MyChart. Active MyChart status and patient understanding of how to access instructions and care plan via MyChart confirmed with patient.     Telephone follow up appointment with care management team member scheduled for: 07/19/23   Verna Czech, LCSW [facility] Licensed Clinical Social Worker Care Coordinator  Direct Dial: [phone]

## 2023-07-05 NOTE — Patient Outreach (Signed)
  Care Coordination   Follow Up Visit Note   07/05/2023 Name: Clarence Hamilton MRN: [id number] DOB: Sep 03, 1956  Clarence Hamilton is a 67 y.o. year old male who sees Fisher, Demetrios Isaacs, MD for primary care. I spoke with  Clarence Hamilton by phone today.  What matters to the patients health and wellness today?  Patient continues with the clearing of his home. Willing to consider outside help, however is making efforts to clear home on his own-small areas at a time. Currently accepting help from neighbors. Safety continues to be emphasized, confirming that APS remains involved, pathways are clear in the home to avoid falls. POA assisting patient with arranging for a payment arrangement with his mortgage company.    Goals Addressed             This Visit's Progress    Care coordination activities       Interventions Today    Flowsheet Row Most Recent Value  Chronic Disease   Chronic disease during today's visit Hypertension (HTN)  General Interventions   General Interventions Discussed/Reviewed General Interventions Reviewed, [employer], Level of Care  [continues with clean up of his home, works on small areas daily, willing to consider outside help to assist with clean up  has mixed feelings-would like to try to clear home on his own. POA assisting with payment plan with [employer]]  Level of Care Personal Care Services  [patient confirms being independent with his ADL's]  Mental Health Interventions   Mental Health Discussed/Reviewed Mental Health Discussed, Coping Strategies, Depression  [patient denies experiencing any episodes of panic since last call, discussed challenges with the clearing of his home-emotional support, reflective listening provided-reinforced use of postive coping stragies discussed]  Pharmacy Interventions   Pharmacy Dicussed/Reviewed Pharmacy Topics Discussed  [patient confirms adherence to medications prescribed by provider for anxiety-continues to  adjust]  Safety Interventions   Safety Discussed/Reviewed Safety Discussed, Home Safety  Home Safety --  [patient states that paths are clear, walking without a cane a walker. APS involved, will be back this week]              SDOH assessments and interventions completed:  No     Care Coordination Interventions:  Yes, provided   Follow up plan: Follow up call scheduled for 07/19/23    Encounter Outcome:  Patient Visit Completed

## 2023-07-06 ENCOUNTER — Other Ambulatory Visit: Payer: Self-pay | Admitting: Family Medicine

## 2023-07-06 ENCOUNTER — Inpatient Hospital Stay: Payer: Medicare HMO | Admitting: Internal Medicine

## 2023-07-06 ENCOUNTER — Inpatient Hospital Stay: Payer: Medicare HMO

## 2023-07-06 DIAGNOSIS — J849 Interstitial pulmonary disease, unspecified: Secondary | ICD-10-CM

## 2023-07-06 DIAGNOSIS — D649 Anemia, unspecified: Secondary | ICD-10-CM | POA: Insufficient documentation

## 2023-07-06 NOTE — Progress Notes (Deleted)
[facility] CONSULT NOTE  Patient Care Team: Malva Limes, MD as PCP - General (Family Medicine) Mertie Moores, MD as Referring Physician (Pulmonary Disease) Pa, [facility] (Optometry) Sheran Luz, MD as Consulting Physician (Physical Medicine and Rehabilitation) Ranee Gosselin, MD as Consulting Physician (Orthopedic Surgery) Su Hoff, PA-C as Physician Assistant (Chiropractic Medicine) Gaspar Cola, [facility] (Inactive) as Pharmacist (Pharmacist) Wenda Overland, LCSW as Social Worker Earna Coder, MD as Consulting Physician (Oncology)  CHIEF COMPLAINTS/PURPOSE OF CONSULTATION: ANEMIA   HEMATOLOGY HISTORY  # ANEMIA[Hb; MCV-platelets- WBC; Iron sat; ferritin;  GFR- CT/US; EGD/colonoscopy-  # CKD- [Dr.]  HISTORY OF PRESENTING ILLNESS:  Clarence Hamilton 67 y.o.  male pleasant patient was been referred to [place] for further evaluation of anemia.     Blood in stools: Blood in urine: Difficulty swallowing: Change of bowel movement/constipation: Prior blood transfusion: Prior history of blood loss:  Liver disease: Chronic kidney disease:  Alcohol:  Bariatric surgery:   Vaginal bleeding:  Prior evaluation with hematology: Prior bone marrow biopsy:  Oral iron: ?  Constipation/dyspepsia Prior IV iron infusions:  # Chronic anemia-normocytic recently getting worse.  Patient is quite symptomatic from anemia . The etiology is likely chronic renal disease. Labs- 2024-   I had a long discussion with patient regarding multiple other etiologies of anemia-including nutritional; malabsorption; primary bone marrow disorders etc.   #I recommend CBC CMP LDH peripheral smear; haptoglobin; erythropoietin; iron studies ferritin B12 folic acid; reticulocyte count; hepatitis panel; HIV; multiple myeloma panel.  Kappa lambda light chain ratio. HOLD of bone marrow Biopsy at this time; pending above work up.    # Discussed at length the  pathophysiology of anemia from chronic kidney disease which includes decreased erythropoietin production; and decreased iron stores in the body.  I discussed that I would recommend using iron infusion/Venofer; along with Retacrit to maintain hemoglobin around 10.  I would recommend the goal hemoglobin less than 10 as there is a increased risk of thromboembolic events in the target hemoglobin is around 12 to 13.     I discussed the potential acute infusion reactions with IV iron; which are quite rare.  Patient understands the risk; will proceed with infusions.  Also discussed the role of Retacrit boosting the hemoglobin.    # Poorly controlled Blood pressures-  will repeat BP again today.  Discussed compliance with medications.  And also follow-up with nephrology closely.  Thank you Dr.  for allowing me to participate in the care of your pleasant patient. Please do not hesitate to contact me with questions or concerns in the interim.  # DISPOSITION: # Possible 1 unit PRBC tomorrow OR venofer # labs today- ordered.  # weekly venofer x3  # follow up 4  weeks- APP  or MD ; labs- cbc/bmp; possible venofer or retacrit-- Dr.B    Review of Systems  Constitutional:  Positive for malaise/fatigue. Negative for chills, diaphoresis, fever and weight loss.  HENT:  Negative for nosebleeds and sore throat.   Eyes:  Negative for double vision.  Respiratory:  Negative for cough, hemoptysis, sputum production, shortness of breath and wheezing.   Cardiovascular:  Negative for chest pain, palpitations, orthopnea and leg swelling.  Gastrointestinal:  Negative for abdominal pain, blood in stool, constipation, diarrhea, heartburn, melena, nausea and vomiting.  Genitourinary:  Negative for dysuria, frequency and urgency.  Musculoskeletal:  Negative for back pain and joint pain.  Skin: Negative.  Negative for itching and rash.  Neurological:  Negative for dizziness, tingling, focal weakness, weakness and headaches.   Endo/Heme/Allergies:  Does not bruise/bleed easily.  Psychiatric/Behavioral:  Negative for depression. The patient is not nervous/anxious and does not have insomnia.      MEDICAL HISTORY:  Past Medical History:  Diagnosis Date   Anxiety    Arthritis    Asthma    History of chicken pox    Hyperglycemia    Hyperlipidemia    Hypertension    Obesity     SURGICAL HISTORY: Past Surgical History:  Procedure Laterality Date   HERNIA REPAIR  1970's   Inguinal hernia repair x 2    TONSILLECTOMY AND ADENOIDECTOMY  1960's    SOCIAL HISTORY: Social History   Socioeconomic History   Marital status: Single    Spouse name: Not on file   Number of children: 0   Years of education: Not on file   Highest education level: Bachelor's degree (e.g., BA, AB, BS)  Occupational History   Occupation: Retired  Tobacco Use   Smoking status: Never   Smokeless tobacco: Never  Vaping Use   Vaping status: Never Used  Substance and Sexual Activity   Alcohol use: No    Alcohol/week: 0.0 standard drinks of alcohol   Drug use: No   Sexual activity: Not on file  Other Topics Concern   Not on file  Social History Narrative   Mother recently passed and patient has apparently went downhill   Social Determinants of Health   Financial Resource Strain: Low Risk  (10/22/2021)   Overall Financial Resource Strain (CARDIA)    Difficulty of Paying Living Expenses: Not hard at all  Food Insecurity: No Food Insecurity (06/21/2023)   Hunger Vital Sign    Worried About Running Out of Food in the Last Year: Never true    Ran Out of Food in the Last Year: Never true  Transportation Needs: No Transportation Needs (06/21/2023)   PRAPARE - Administrator, Civil Service (Medical): No    Lack of Transportation (Non-Medical): No  Physical Activity: Inactive (03/25/2020)   Exercise Vital Sign    Days of Exercise per Week: 0 days    Minutes of Exercise per Session: 0 min  Stress: No Stress Concern  Present (03/25/2020)   [employer] of Occupational Health - Occupational Stress Questionnaire    Feeling of Stress : Only a little  Social Connections: Socially Isolated (03/25/2020)   Social Connection and Isolation Panel [NHANES]    Frequency of Communication with Friends and Family: More than three times a week    Frequency of Social Gatherings with Friends and Family: More than three times a week    Attends Religious Services: Never    Database administrator or Organizations: No    Attends Banker Meetings: Never    Marital Status: Never married  Intimate Partner Violence: Not At Risk (06/04/2023)   Humiliation, Afraid, Rape, and Kick questionnaire    Fear of Current or Ex-Partner: No    Emotionally Abused: No    Physically Abused: No    Sexually Abused: No    FAMILY HISTORY: Family History  Problem Relation Age of Onset   Hypertension Mother    Breast cancer Mother    Melanoma Father    Hypertension Father    Parkinson's disease Father    Throat cancer Paternal Grandmother     ALLERGIES:  is allergic to mustard and sulfa antibiotics.  MEDICATIONS:  Current Outpatient Medications  Medication Sig  Dispense Refill   diazepam (VALIUM) 5 MG tablet Take 3 tablets (15 mg total) by mouth 3 (three) times daily. 270 tablet 0   diltiazem (CARDIZEM CD) 120 MG 24 hr capsule Take 1 capsule (120 mg total) by mouth daily. 90 capsule 0   DULoxetine (CYMBALTA) 20 MG capsule Take 1 capsule (20 mg total) by mouth daily. 90 capsule 0   fluticasone (FLONASE) 50 MCG/ACT nasal spray Place 1 spray into both nostrils daily. 16 g 0   fluticasone-salmeterol (ADVAIR) 100-50 MCG/ACT AEPB Inhale 1 puff into the lungs 2 (two) times daily. 1 each 3   hydrochlorothiazide (HYDRODIURIL) 12.5 MG tablet Take 1 tablet (12.5 mg total) by mouth daily. 90 tablet 0   loratadine (CLARITIN) 10 MG tablet Take 1 tablet (10 mg total) by mouth daily. 100 tablet 0   losartan (COZAAR) 25 MG tablet Take 1  tablet (25 mg total) by mouth daily. 90 tablet 0   magnesium oxide (MAG-OX) 400 (240 Mg) MG tablet Take 1 tablet (400 mg total) by mouth 2 (two) times daily. 180 tablet 0   montelukast (SINGULAIR) 10 MG tablet Take 1 tablet (10 mg total) by mouth at bedtime. 90 tablet 0   Multiple Vitamins-Minerals (CENTRUM SILVER PO) Take 1 tablet by mouth daily.     nystatin-triamcinolone ointment (MYCOLOG) Apply 1 Application topically 2 (two) times daily. Apply to l groin irritation x 2 weeks 60 g 0   prednisoLONE acetate (PRED FORTE) 1 % ophthalmic suspension Place 1 drop into the right eye 4 (four) times daily. 5 mL 0   albuterol (VENTOLIN HFA) 108 (90 Base) MCG/ACT inhaler Inhale 1 puff into the lungs every 6 (six) hours as needed. 18 g 10   rosuvastatin (CRESTOR) 20 MG tablet Take 1 tablet (20 mg total) by mouth daily. 90 tablet 0   tamsulosin (FLOMAX) 0.4 MG CAPS capsule Take 1 capsule (0.4 mg total) by mouth daily. 90 capsule 0   No current facility-administered medications for this visit.     Marland Kitchen  PHYSICAL EXAMINATION:   There were no vitals filed for this visit. There were no vitals filed for this visit.  Physical Exam Vitals and nursing note reviewed.  HENT:     Head: Normocephalic and atraumatic.     Mouth/Throat:     Pharynx: Oropharynx is clear.  Eyes:     Extraocular Movements: Extraocular movements intact.     Pupils: Pupils are equal, round, and reactive to light.  Cardiovascular:     Rate and Rhythm: Normal rate and regular rhythm.  Pulmonary:     Comments: Decreased breath sounds bilaterally.  Abdominal:     Palpations: Abdomen is soft.  Musculoskeletal:        General: Normal range of motion.     Cervical back: Normal range of motion.  Skin:    General: Skin is warm.  Neurological:     General: No focal deficit present.     Mental Status: He is alert and oriented to person, place, and time.  Psychiatric:        Behavior: Behavior normal.        Judgment: Judgment  normal.      LABORATORY DATA:  I have reviewed the data as listed Lab Results  Component Value Date   WBC 12.1 (H) 06/29/2023   HGB 10.6 (L) 06/29/2023   HCT 33.5 (L) 06/29/2023   MCV 94 06/29/2023   PLT 314 06/29/2023   Recent Labs    05/31/23 0349 05/31/23  1259 06/01/23 0412 06/02/23 0405 06/08/23 0307 06/09/23 0803 06/12/23 0501 06/29/23 1640  NA 131*   < > 139   < > 136 136 136 141  K 2.8*   < > 3.1*   < > 3.5 3.9 3.5 3.6  CL 97*   < > 102   < > 102 100 98 101  CO2 26   < > 27   < > 24 26 24 26   GLUCOSE 116*   < > 116*   < > 107* 97 112* 117*  BUN 19   < > 21   < > 23 22 26* 19  CREATININE 1.28*   < > 1.24   < > 1.46* 1.56* 1.68* 1.69*  CALCIUM 7.7*   < > 8.7*   < > 8.7* 8.8* 8.8* 9.8  GFRNONAA >60   < > >60   < > 53* 49* 45*  --   PROT 5.6*  --  6.4*  --   --   --   --  6.9  ALBUMIN 2.8*  --  3.2*  --   --   --   --  4.3  AST 33  --  45*  --   --   --   --  36  ALT 46*  --  58*  --   --   --   --  63*  ALKPHOS 72  --  70  --   --   --   --  100  BILITOT 0.6  --  0.9  --   --   --   --  <0.2   < > = values in this interval not displayed.     No results found.  ASSESSMENT & PLAN:   No problem-specific Assessment & Plan notes found for this encounter.    All questions were answered. The patient knows to call the clinic with any problems, questions or concerns.    Earna Coder, MD 07/06/2023 1:28 PM

## 2023-07-08 ENCOUNTER — Other Ambulatory Visit: Payer: Self-pay | Admitting: Family Medicine

## 2023-07-08 ENCOUNTER — Ambulatory Visit
Admission: RE | Admit: 2023-07-08 | Discharge: 2023-07-08 | Disposition: A | Discharge status: Home / Self Care | Payer: Medicare HMO | Source: Ambulatory Visit | Attending: Family Medicine | Admitting: Family Medicine

## 2023-07-08 DIAGNOSIS — N1832 Chronic kidney disease, stage 3b: Secondary | ICD-10-CM | POA: Insufficient documentation

## 2023-07-08 DIAGNOSIS — N281 Cyst of kidney, acquired: Secondary | ICD-10-CM | POA: Diagnosis not present

## 2023-07-08 DIAGNOSIS — R972 Elevated prostate specific antigen [PSA]: Secondary | ICD-10-CM | POA: Diagnosis not present

## 2023-07-08 NOTE — Telephone Encounter (Unsigned)
Copied from CRM [id number]. Topic: General - Other >> Jul 08, 2023 11:10 AM Everette C wrote: Reason for CRM: Medication Refill - Medication: Rx #: [id number]  diazepam (VALIUM) 5 MG tablet [[id number]]    Has the patient contacted their pharmacy? Yes.   (Agent: If no, request that the patient contact the pharmacy for the refill. If patient does not wish to contact the pharmacy document the reason why and proceed with request.) (Agent: If yes, when and what did the pharmacy advise?)  Preferred Pharmacy (with phone number or street name): [id number] - [place], [place] - [address] AT [facility] [address] [facility] [address] [place] [address] Phone: [phone] Fax: [phone] Hours: Not open 24 hours   Has the patient been seen for an appointment in the last year OR does the patient have an upcoming appointment? Yes.    Agent: Please be advised that RX refills may take up to 3 business days. We ask that you follow-up with your pharmacy.

## 2023-07-09 NOTE — Progress Notes (Signed)
Normal renal ultrasound; benign cyst in L kidney.

## 2023-07-11 ENCOUNTER — Other Ambulatory Visit: Payer: Self-pay

## 2023-07-11 NOTE — Telephone Encounter (Signed)
Copied from CRM [id number]. Topic: General - Other >> Jul 11, 2023 12:34 PM Dondra Prader E wrote: Reason for CRM: Pt called reporting that his pharmacy is experiencing technical difficulties with their phones, he wants to be contacted once his prescription has been called in to his pharmacy. Regarding [facility]am for [facility] on [address].   Best contact: [phone]  Pt says he will be completely out tomorrow

## 2023-07-11 NOTE — Telephone Encounter (Signed)
Requested medication (s) are due for refill today - yes  Requested medication (s) are on the active medication list -yes  Future visit scheduled -yes  Last refill: 06/14/23 #270  Notes to clinic: non delegated Rx  Requested Prescriptions  Pending Prescriptions Disp Refills   diazepam (VALIUM) 5 MG tablet 270 tablet 0    Sig: Take 3 tablets (15 mg total) by mouth 3 (three) times daily.     Not Delegated - Psychiatry: Anxiolytics/Hypnotics 2 Failed - 07/08/2023 11:29 AM      Failed - This refill cannot be delegated      Passed - Urine Drug Screen completed in last 360 days      Passed - Patient is not pregnant      Passed - Valid encounter within last 6 months    Recent Outpatient Visits           1 week ago Mood disorder [facility])   [facility] Jacky Kindle, FNP   1 year ago Annual physical exam   [facility] Malva Limes, MD   1 year ago Primary hypertension   [facility] [facility] Malva Limes, MD   2 years ago Hyperglycemia   [facility] Malva Limes, MD   2 years ago Hyperglycemia   [place] [facility] Malva Limes, MD       Future Appointments             In 2 weeks Sherrie Mustache, Demetrios Isaacs, MD [facility]   In 1 month Stoioff, Verna Czech, MD [facility] [place]               Requested Prescriptions  Pending Prescriptions Disp Refills   diazepam (VALIUM) 5 MG tablet 270 tablet 0    Sig: Take 3 tablets (15 mg total) by mouth 3 (three) times daily.     Not Delegated - Psychiatry: Anxiolytics/Hypnotics 2 Failed - 07/08/2023 11:29 AM      Failed - This refill cannot be delegated      Passed - Urine Drug Screen completed in last 360 days      Passed - Patient is not pregnant      Passed - Valid encounter within last 6 months    Recent Outpatient Visits           1 week ago Mood disorder  [facility])   [place] [facility] Jacky Kindle, FNP   1 year ago Annual physical exam   [facility] Malva Limes, MD   1 year ago Primary hypertension    [facility] Malva Limes, MD   2 years ago Hyperglycemia   [facility] Malva Limes, MD   2 years ago Hyperglycemia   [facility] Malva Limes, MD       Future Appointments             In 2 weeks Fisher, Demetrios Isaacs, MD [facility], [facility]   In 1 month Stoioff, Verna Czech, MD [facility]

## 2023-07-12 ENCOUNTER — Ambulatory Visit (INDEPENDENT_AMBULATORY_CARE_PROVIDER_SITE_OTHER): Payer: Medicare HMO

## 2023-07-12 VITALS — Ht 69.0 in | Wt 204.0 lb

## 2023-07-12 DIAGNOSIS — Z0000 Encounter for general adult medical examination without abnormal findings: Secondary | ICD-10-CM | POA: Diagnosis not present

## 2023-07-12 DIAGNOSIS — Z59819 Housing instability, housed unspecified: Secondary | ICD-10-CM

## 2023-07-12 DIAGNOSIS — Z748 Other problems related to care provider dependency: Secondary | ICD-10-CM | POA: Diagnosis not present

## 2023-07-12 MED ORDER — DIAZEPAM 5 MG PO TABS: 15.0000 mg | ORAL_TABLET | Freq: Three times a day (TID) | ORAL | 0 refills | Status: DC

## 2023-07-12 NOTE — Progress Notes (Signed)
Subjective:   Clarence Hamilton is a 67 y.o. male who presents for Medicare Annual/Subsequent preventive examination.  Visit Complete: Virtual  I connected with  [name] on 07/12/23 by a audio enabled telemedicine application and verified that I am speaking with the correct person using two identifiers.  Patient Location: Home  Provider Location: Office/Clinic  I discussed the limitations of evaluation and management by telemedicine. The patient expressed understanding and agreed to proceed.  Vital Signs: Unable to obtain new vitals due to this being a telehealth visit.  Patient Medicare AWV questionnaire was completed by the patient on (not done); I have confirmed that all information answered by patient is correct and no changes since this date. Cardiac Risk Factors include: advanced age (>58men, >51 women);hypertension;dyslipidemia;obesity (BMI >30kg/m2);male gender;sedentary lifestyle    Objective:    Today's Vitals   07/12/23 1522 07/12/23 1524  Weight: 204 lb (92.5 kg)   Height: 5\' 9"  (1.753 m)   PainSc:  7    Body mass index is 30.13 kg/m.     07/12/2023    3:51 PM 06/04/2023    1:00 PM 05/29/2023   11:25 PM 05/29/2023    4:15 PM 03/25/2020    3:04 PM  Advanced Directives  Does Patient Have a Medical Advance Directive? Yes No No No Yes  Type of Estate agent of [place];Living will    Healthcare Power of [place];Living will  Copy of Healthcare Power of Attorney in Chart?     No - copy requested  Would patient like information on creating a medical advance directive?  No - Patient declined No - Patient declined      Current Medications (verified) Outpatient Encounter Medications as of 07/12/2023  Medication Sig   albuterol (VENTOLIN HFA) 108 (90 Base) MCG/ACT inhaler Inhale 1 puff into the lungs every 6 (six) hours as needed.   diazepam (VALIUM) 5 MG tablet Take 3 tablets (15 mg total) by mouth 3 (three) times daily.   diltiazem  (CARDIZEM CD) 120 MG 24 hr capsule Take 1 capsule (120 mg total) by mouth daily.   DULoxetine (CYMBALTA) 20 MG capsule Take 1 capsule (20 mg total) by mouth daily.   fluticasone (FLONASE) 50 MCG/ACT nasal spray Place 1 spray into both nostrils daily.   fluticasone-salmeterol (ADVAIR) 100-50 MCG/ACT AEPB Inhale 1 puff into the lungs 2 (two) times daily.   hydrochlorothiazide (HYDRODIURIL) 12.5 MG tablet Take 1 tablet (12.5 mg total) by mouth daily.   loratadine (CLARITIN) 10 MG tablet Take 1 tablet (10 mg total) by mouth daily.   losartan (COZAAR) 25 MG tablet Take 1 tablet (25 mg total) by mouth daily.   magnesium oxide (MAG-OX) 400 (240 Mg) MG tablet Take 1 tablet (400 mg total) by mouth 2 (two) times daily.   montelukast (SINGULAIR) 10 MG tablet Take 1 tablet (10 mg total) by mouth at bedtime.   Multiple Vitamins-Minerals (CENTRUM SILVER PO) Take 1 tablet by mouth daily.   nystatin-triamcinolone ointment (MYCOLOG) Apply 1 Application topically 2 (two) times daily. Apply to l groin irritation x 2 weeks   prednisoLONE acetate (PRED FORTE) 1 % ophthalmic suspension Place 1 drop into the right eye 4 (four) times daily.   rosuvastatin (CRESTOR) 20 MG tablet Take 1 tablet (20 mg total) by mouth daily.   tamsulosin (FLOMAX) 0.4 MG CAPS capsule Take 1 capsule (0.4 mg total) by mouth daily.   No facility-administered encounter medications on file as of 07/12/2023.    Allergies (verified)  Mustard and Sulfa antibiotics   History: Past Medical History:  Diagnosis Date   Anxiety    Arthritis    Asthma    History of chicken pox    Hyperglycemia    Hyperlipidemia    Hypertension    Obesity    Past Surgical History:  Procedure Laterality Date   HERNIA REPAIR  1970's   Inguinal hernia repair x 2    TONSILLECTOMY AND ADENOIDECTOMY  1960's   Family History  Problem Relation Age of Onset   Hypertension Mother    Breast cancer Mother    Melanoma Father    Hypertension Father    Parkinson's  disease Father    Throat cancer Paternal Grandmother    Social History   Socioeconomic History   Marital status: Single    Spouse name: Not on file   Number of children: 0   Years of education: Not on file   Highest education level: Bachelor's degree (e.g., BA, AB, BS)  Occupational History   Occupation: Retired  Tobacco Use   Smoking status: Never   Smokeless tobacco: Never  Vaping Use   Vaping status: Never Used  Substance and Sexual Activity   Alcohol use: No    Alcohol/week: 0.0 standard drinks of alcohol   Drug use: No   Sexual activity: Not on file  Other Topics Concern   Not on file  Social History Narrative   Mother recently passed and patient has apparently went downhill   Social Determinants of Health   Financial Resource Strain: Low Risk  (07/12/2023)   Overall Financial Resource Strain (CARDIA)    Difficulty of Paying Living Expenses: Not hard at all  Food Insecurity: No Food Insecurity (07/12/2023)   Hunger Vital Sign    Worried About Running Out of Food in the Last Year: Never true    Ran Out of Food in the Last Year: Never true  Transportation Needs: No Transportation Needs (07/12/2023)   PRAPARE - Administrator, Civil Service (Medical): No    Lack of Transportation (Non-Medical): No  Physical Activity: Inactive (07/12/2023)   Exercise Vital Sign    Days of Exercise per Week: 0 days    Minutes of Exercise per Session: 0 min  Stress: Stress Concern Present (07/12/2023)   [employer] of Occupational Health - Occupational Stress Questionnaire    Feeling of Stress : To some extent  Social Connections: Socially Isolated (07/12/2023)   Social Connection and Isolation Panel [NHANES]    Frequency of Communication with Friends and Family: More than three times a week    Frequency of Social Gatherings with Friends and Family: More than three times a week    Attends Religious Services: Never    Database administrator or Organizations: No     Attends Engineer, structural: Never    Marital Status: Never married    Tobacco Counseling Counseling given: Not Answered   Clinical Intake:  Pre-visit preparation completed: Yes  Pain : 0-10 Pain Score: 7  Pain Type: Chronic pain (both knees) Pain Location: Back Pain Orientation: Lower Pain Descriptors / Indicators: Aching Pain Onset: More than a month ago Pain Frequency: Intermittent Pain Relieving Factors: Tylenol, heating pads  Pain Relieving Factors: Tylenol, heating pads  BMI - recorded: 30.13 Nutritional Status: BMI > 30  Obese Nutritional Risks: None Diabetes: No  How often do you need to have someone help you when you read instructions, pamphlets, or other written materials from your doctor or  pharmacy?: 1 - Never  Interpreter Needed?: No  Comments: lives alone Information entered by :: B.Presten Joost,LPN   Activities of Daily Living    07/12/2023    3:51 PM 06/04/2023    1:00 PM  In your present state of health, do you have any difficulty performing the following activities:  Hearing? 0 0  Vision? 0 0  Difficulty concentrating or making decisions? 1 0  Walking or climbing stairs? 1 0  Dressing or bathing? 0 0  Doing errands, shopping? 0 0  Preparing Food and eating ? N   Using the Toilet? N   In the past six months, have you accidently leaked urine? N   Do you have problems with loss of bowel control? N   Managing your Medications? N   Managing your Finances? N   Housekeeping or managing your Housekeeping? N     Patient Care Team: Malva Limes, MD as PCP - General (Family Medicine) Mertie Moores, MD as Referring Physician (Pulmonary Disease) [facility], [facility] (Optometry) Sheran Luz, MD as Consulting Physician (Physical Medicine and Rehabilitation) Ranee Gosselin, MD as Consulting Physician (Orthopedic Surgery) Su Hoff, PA-C as Physician Assistant (Chiropractic Medicine) Gaspar Cola, [facility] (Inactive) as  Pharmacist (Pharmacist) Wenda Overland, LCSW as Social Worker Earna Coder, MD as Consulting Physician (Oncology)  Indicate any recent Medical Services you may have received from other than [facility] providers in the past year (date may be approximate).     Assessment:   This is a routine wellness examination for [place].  Hearing/Vision screen Hearing Screening - Comments:: Pt says his hearing is good Vision Screening - Comments:: Pt says he has adequate vision without glasses [facility]   Goals Addressed             This Visit's Progress    LIFESTYLE - DECREASE FALLS RISK   Not on track    Recommend to remove any items from the home that may cause slips or trips.       Depression Screen    07/12/2023    3:47 PM 06/29/2023    3:58 PM 06/21/2023    3:44 PM 02/26/2022    2:42 PM 08/25/2021    2:53 PM 08/19/2020    4:17 PM 12/25/2019    2:01 PM  PHQ 2/9 Scores  PHQ - 2 Score 0 0 0 4 0 0 1  PHQ- 9 Score  2  9 3 1      Fall Risk    07/12/2023    3:30 PM 02/26/2022    2:40 PM 08/25/2021    2:53 PM 08/19/2020    4:18 PM 03/25/2020    3:05 PM  Fall Risk   Falls in the past year? 1 1 1  0 1  Number falls in past yr: 1 0 0 0 0  Injury with Fall? 0 0 0 0 0  Risk for fall due to : No Fall Risks History of fall(s) Medication side effect;Impaired mobility    Follow up Falls prevention discussed;Education provided Falls evaluation completed;Falls prevention discussed Falls prevention discussed Falls evaluation completed Falls prevention discussed    MEDICARE RISK AT HOME: Medicare Risk at Home Any stairs in or around the home?: No If so, are there any without handrails?: No Home free of loose throw rugs in walkways, pet beds, electrical cords, etc?: Yes Adequate lighting in your home to reduce risk of falls?: Yes Life alert?: No Use of a cane, walker or w/c?: No Grab bars  in the bathroom?: No Shower chair or bench in shower?: No Elevated toilet seat or a handicapped  toilet?: No  TIMED UP AND GO:  Was the test performed?  No    Cognitive Function:        07/12/2023    3:53 PM  6CIT Screen  What Year? 0 points  What month? 0 points  What time? 0 points  Count back from 20 0 points  Months in reverse 0 points  Repeat phrase 0 points  Total Score 0 points    Immunizations Immunization History  Administered Date(s) Administered   H1N1 08/29/2008   Influenza Split 10/03/2014   Influenza,inj,Quad PF,6+ Mos 08/19/2020, 08/25/2021   Influenza-Unspecified 09/07/2017, 07/25/2018, 07/31/2019   PFIZER Comirnaty(Gray Top)Covid-19 Tri-Sucrose Vaccine 01/10/2020, 01/31/2020   PFIZER(Purple Top)SARS-COV-2 Vaccination 12/17/2020   PNEUMOCOCCAL CONJUGATE-20 02/26/2022   Tdap 02/17/2016    TDAP status: Up to date  Flu Vaccine status: Due, Education has been provided regarding the importance of this vaccine. Advised may receive this vaccine at local pharmacy or Health Dept. Aware to provide a copy of the vaccination record if obtained from local pharmacy or Health Dept. Verbalized acceptance and understanding.  Pneumococcal vaccine status: Up to date  Covid-19 vaccine status: Completed vaccines  Qualifies for Shingles Vaccine? Yes   Zostavax completed NO  Shingrix Completed?: No.    Education has been provided regarding the importance of this vaccine. Patient has been advised to call insurance company to determine out of pocket expense if they have not yet received this vaccine. Advised may also receive vaccine at local pharmacy or Health Dept. Verbalized acceptance and understanding.  Screening Tests Health Maintenance  Topic Date Due   COVID-19 Vaccine (4 - 2023-24 season) 07/28/2023 (Originally 06/26/2023)   Zoster Vaccines- Shingrix (1 of 2) 10/11/2023 (Originally [birth year]/01/1976)   INFLUENZA VACCINE  01/23/2024 (Originally 05/26/2023)   Fecal DNA (Cologuard)  07/11/2024 (Originally 01/25/2023)   Medicare Annual Wellness (AWV)  07/11/2024   DTaP/Tdap/Td  (2 - Td or Tdap) 02/16/2026   Pneumonia Vaccine 49+ Years old  Completed   Hepatitis C Screening  Completed   HPV VACCINES  Aged Out    Health Maintenance  There are no preventive care reminders to display for this patient.   Colorectal cancer screening: Type of screening: Cologuard. Completed yes. Repeat every 3 years  Lung Cancer Screening: (Low Dose CT Chest recommended if Age 39-80 years, 20 pack-year currently smoking OR have quit w/in 15years.) does not qualify.   Lung Cancer Screening Referral: no  Additional Screening:  Hepatitis C Screening: does not qualify; Completed yes  Vision Screening: Recommended annual ophthalmology exams for early detection of glaucoma and other disorders of the eye. Is the patient up to date with their annual eye exam?  Yes  Who is the provider or what is the name of the office in which the patient attends annual eye exams? [facility] If pt is not established with a provider, would they like to be referred to a provider to establish care? No .   Dental Screening: Recommended annual dental exams for proper oral hygiene  Diabetic Foot Exam: n/a  Community Resource Referral / Chronic Care Management: CRR required this visit?  No   CCM required this visit?  No    Plan:     I have personally reviewed and noted the following in the patient's chart:   Medical and social history Use of alcohol, tobacco or illicit drugs  Current medications and supplements including opioid  prescriptions. Patient is currently taking opioid prescriptions. Information provided to patient regarding non-opioid alternatives. Patient advised to discuss non-opioid treatment plan with their provider. Functional ability and status Nutritional status Physical activity Advanced directives List of other physicians Hospitalizations, surgeries, and ER visits in previous 12 months Vitals Screenings to include cognitive, depression, and falls Referrals and  appointments  In addition, I have reviewed and discussed with patient certain preventive protocols, quality metrics, and best practice recommendations. A written personalized care plan for preventive services as well as general preventive health recommendations were provided to patient.    Sue Lush, LPN   [id number]/[id number]   After Visit Summary: (Declined) Due to this being a telephonic visit, with patients personalized plan was offered to patient but patient Declined AVS at this time   Nurse Notes: Pt says he has high anxiety, has been on medication for it. He relays a recent hospitalization in which they took him off of all of his anxiety meds due to being "out of it". He reports a number of health issues he is going through right now. He does have a rx for Valium and says an "old" tx for Hydrocodone. Pt relays he rarely takes the Hydrocodone only if his pain get uncontrolled. Pt also relays he cannot wash clothes or bath due to how water and washer/dryer problems he needs to get fixed. He says he washes up in a sink.He reports having a decrease in memory recently. Pt does not drive :says he does not "trust himself" to drive and his car needs repairing also. He relays he has neighbors he gets to take him to appts and errands. Pt does state he has a lot of upcoming appts and transportation presents to be a challenge.   *CRR referral made

## 2023-07-12 NOTE — Patient Instructions (Addendum)
Clarence Hamilton , Thank you for taking time to come for your Medicare Wellness Visit. I appreciate your ongoing commitment to your health goals. Please review the following plan we discussed and let me know if I can assist you in the future.   Referrals/Orders/Follow-Ups/Clinician Recommendations: referral to CRR ([facility]O pt) for housing and transportation insecurity  A referral was placed for you today for community resources. This will check to see what community resources are available for you for anything we discussed during your wellness visit today that you may be having difficulty with such a obtaining food, paying or fixing for utilities, or transportation needs. Someone will contact you in a few days. If you haven't heard from someone within the next month, please call the number below  [facility] Concierge Line  [phone]   This is a list of the screening recommended for you and due dates:  Health Maintenance  Topic Date Due   Zoster (Shingles) Vaccine (1 of 2) Never done   Cologuard (Stool DNA test)  01/25/2023   COVID-19 Vaccine (4 - 2023-24 season) 06/26/2023   Flu Shot  01/23/2024*   Medicare Annual Wellness Visit  07/11/2024   DTaP/Tdap/Td vaccine (2 - Td or Tdap) 02/16/2026   Pneumonia Vaccine  Completed   Hepatitis C Screening  Completed   HPV Vaccine  Aged Out  *Topic was postponed. The date shown is not the original due date.    Advanced directives: (In Chart) A copy of your advanced directives are scanned into your chart should your provider ever need it.  Next Medicare Annual Wellness Visit scheduled for next year: Yes 07/17/24 @ 3pm telephone

## 2023-07-13 ENCOUNTER — Telehealth: Payer: Self-pay | Admitting: *Deleted

## 2023-07-13 NOTE — Progress Notes (Signed)
Care Coordination   Note   07/13/2023 Name: Laymon Mahala MRN: [id number] DOB: [birth year]  Alwyn Ren Michalsky is a 67 y.o. year old male who sees Fisher, Demetrios Isaacs, MD for primary care. I reached out to Alwyn Ren Corporan by phone today to offer care coordination services.  Mr. Heaphy was given information about Care Coordination services today including:   The Care Coordination services include support from the care team which includes your Nurse Coordinator, Clinical Social Worker, or Pharmacist.  The Care Coordination team is here to help remove barriers to the health concerns and goals most important to you. Care Coordination services are voluntary, and the patient may decline or stop services at any time by request to their care team member.   Care Coordination Consent Status: Patient agreed to services and verbal consent obtained.   Follow up plan:  Telephone appointment with care coordination team member scheduled for:  08/09/2023  Encounter Outcome:  Patient Scheduled from referral - pt declined need for transportation resources at this time.   Burman Nieves, CCMA Care Coordination Care Guide Direct Dial: [phone]

## 2023-07-19 ENCOUNTER — Ambulatory Visit: Payer: Self-pay | Admitting: *Deleted

## 2023-07-20 ENCOUNTER — Ambulatory Visit: Payer: Self-pay

## 2023-07-20 MED ORDER — AZELASTINE-FLUTICASONE 137-50 MCG/ACT NA SUSP: 1.0000 | Freq: Two times a day (BID) | NASAL | 1 refills | Status: AC

## 2023-07-20 NOTE — Patient Instructions (Signed)
Visit Information Thank you for taking time to visit with me today. Please don't hesitate to contact me if I can be of assistance to you.   Following are the goals we discussed today:  Patient to continue to pace self while clearing family home Patient to continue to accept offers of assistance and support from trusted family and friends  Patient to contact his provider office with any additional questions regarding is medical care   Our next appointment is by telephone on 08/02/23 at 2pm  Please call the care guide team at [phone] if you need to cancel or reschedule your appointment.   If you are experiencing a Mental Health or Behavioral Health Crisis or need someone to talk to, please call 911   Patient verbalizes understanding of instructions and care plan provided today and agrees to view in MyChart. Active MyChart status and patient understanding of how to access instructions and care plan via MyChart confirmed with patient.     Telephone follow up appointment with care management team member scheduled for: 08/02/23  Verna Czech, LCSW [place]  [facility], [facility] Licensed Clinical Social Worker Care Coordinator  Direct Dial: [phone]

## 2023-07-20 NOTE — Addendum Note (Signed)
Addended by: Malva Limes on: 07/20/2023 04:20 PM   Modules accepted: Orders

## 2023-07-20 NOTE — Patient Outreach (Signed)
Care Coordination   Follow Up Visit Note   07/20/2023 late entry Name: Clarence Hamilton MRN: [id number] DOB: 1956-10-11  Clarence Hamilton is a 67 y.o. year old male who sees Fisher, Demetrios Isaacs, MD for primary care. I spoke with  Clarence Hamilton by phone yesterday.  What matters to the patients health and wellness today?  Clearing of patient's family home    Goals Addressed             This Visit's Progress    Care coordination activities       Interventions Today    Flowsheet Row Most Recent Value  Chronic Disease   Chronic disease during today's visit Hypertension (HTN)  General Interventions   General Interventions Discussed/Reviewed General Interventions Reviewed, [employer]  [confirmed continued efforts to clear his home. Neighbors assisting with the yard work, replacing light fixtures, and replacing water heater-aunt calls daily to monitor progress of the clean up-POA assisting with hardship application for Mortgage]  Mental Health Interventions   Mental Health Discussed/Reviewed Mental Health Reviewed, Coping Strategies, Depression  [pt confirms decrease in panic attacks, last panic attack "very mild" finding satisfaction in the clean up of his home-supportive counseling provided, continued to reinforce use of positive coping strategies]              SDOH assessments and interventions completed:  No     Care Coordination Interventions:  Yes, provided   Follow up plan: Follow up call scheduled for 08/02/23    Encounter Outcome:  Patient Visit Completed

## 2023-07-20 NOTE — Telephone Encounter (Signed)
Message from Phill Myron sent at 07/20/2023  9:17 AM EDT  Summary: clogged nose, swollen   Clogged nose, swollen (2 weeks now ) was using a medication given to him at the hospital and would like that again,  Azelastine-Fluticasone 137-50 MCG/ACT SUSP         Chief Complaint: medication request for Azelastine 0.1% nasal spray start date 06/09/23-06/15/23/ Fluticasone 50 mcg/ACT nasal spray 06/03/23-06/15/23 Symptoms: nasal congestion every day since med ran out Disposition: [] ED /[] Urgent Care (no appt availability in office) / [] Appointment(In office/virtual)/ []  [facility]/ [] Home Care/ [] Refused Recommended Disposition /[]  Mobile Bus/ [x]  Follow-up with PCP Additional Notes: pt had OV 06/29/23 and issue addressed.  Reason for Disposition  [1] Prescription refill request for NON-ESSENTIAL medicine (i.e., no harm to patient if med not taken) AND [2] triager unable to refill per department policy  Answer Assessment - Initial Assessment Questions 1. DRUG NAME: "What medicine do you need to have refilled?"     Azelastine Fluticasone 4. PRESCRIBING HCP: "Who prescribed it?" Reason: If prescribed by specialist, call should be referred to that group.     Last prescribed in hospital  5. SYMPTOMS: "Do you have any symptoms?"     Nasal congestion feel it every day and now out of it .  Protocols used: Medication Refill and Renewal Call-A-AH

## 2023-07-27 ENCOUNTER — Ambulatory Visit: Payer: Medicare HMO | Admitting: Student in an Organized Health Care Education/Training Program

## 2023-07-27 ENCOUNTER — Encounter: Payer: Self-pay | Admitting: Student in an Organized Health Care Education/Training Program

## 2023-07-27 VITALS — BP 110/76 | HR 62 | Temp 97.6°F | Ht 69.0 in | Wt 205.4 lb

## 2023-07-27 DIAGNOSIS — J4530 Mild persistent asthma, uncomplicated: Secondary | ICD-10-CM

## 2023-07-27 MED ORDER — MONTELUKAST SODIUM 10 MG PO TABS
10.0000 mg | ORAL_TABLET | Freq: Every day | ORAL | 11 refills | Status: AC
Start: 2023-07-27 — End: 2024-07-26

## 2023-07-27 MED ORDER — FLUTICASONE-SALMETEROL 100-50 MCG/ACT IN AEPB
1.0000 | INHALATION_SPRAY | Freq: Two times a day (BID) | RESPIRATORY_TRACT | 3 refills | Status: AC
Start: 2023-07-27 — End: ?

## 2023-07-27 MED ORDER — LORATADINE 10 MG PO TABS
10.0000 mg | ORAL_TABLET | Freq: Two times a day (BID) | ORAL | 11 refills | Status: DC
Start: 2023-07-27 — End: 2023-08-24

## 2023-07-27 NOTE — Progress Notes (Signed)
Synopsis: Referred in for asthma by Malva Limes, MD  Assessment & Plan:   1. Mild persistent asthma without complication  Presents for the evaluation of asthma with history of mild intermittent asthma managed with ICS/LABA therapy (advair), montelukast, and loratadine. Today, his symptoms are well managed and he is asymptomatic. Physical exam reveals clear lungs without wheeze, rales, or rhonchi. Patient was previously seen at [facility] but wishes to transition his care over to [place]. I have refilled his inhalers and his other pulmonary related medications.  As to the question of interstitial lung disease, I have reviewed Clarence Hamilton's previous imaging studies and do not see any evidence of ILD. He's had a chest CT in high resolution in 2018 that did not show any evidence of ILD. He's also had a chest CT in August of 2024 that showed bronchial wall thickening and mosaic attenuation of the airspaces consistent with small airway disease (asthma). The image was performed while the patient  was exhaling and I don't see findings that are very suggestive of EDAC, though this could be considered should he have difficult to control symptoms - not the case during today's visit.  - fluticasone-salmeterol (ADVAIR) 100-50 MCG/ACT AEPB; Inhale 1 puff into the lungs 2 (two) times daily.  Dispense: 1 each; Refill: 3 - loratadine (CLARITIN) 10 MG tablet; Take 1 tablet (10 mg total) by mouth 2 (two) times daily.  Dispense: 60 tablet; Refill: 11 - montelukast (SINGULAIR) 10 MG tablet; Take 1 tablet (10 mg total) by mouth at bedtime.  Dispense: 30 tablet; Refill: 11   Return in about 6 months (around 01/25/2024).  I spent 60 minutes caring for this patient today, including preparing to see the patient, obtaining a medical history , reviewing a separately obtained history, performing a medically appropriate examination and/or evaluation, counseling and educating the patient/family/caregiver, ordering medications, tests,  or procedures, documenting clinical information in the electronic health record, and independently interpreting results (not separately reported/billed) and communicating results to the patient/family/caregiver  Raechel Chute, MD [facility] 07/27/2023 4:38 PM    End of visit medications:  Meds ordered this encounter  Medications   fluticasone-salmeterol (ADVAIR) 100-50 MCG/ACT AEPB    Sig: Inhale 1 puff into the lungs 2 (two) times daily.    Dispense:  1 each    Refill:  3   loratadine (CLARITIN) 10 MG tablet    Sig: Take 1 tablet (10 mg total) by mouth 2 (two) times daily.    Dispense:  60 tablet    Refill:  11   montelukast (SINGULAIR) 10 MG tablet    Sig: Take 1 tablet (10 mg total) by mouth at bedtime.    Dispense:  30 tablet    Refill:  11     Current Outpatient Medications:    albuterol (VENTOLIN HFA) 108 (90 Base) MCG/ACT inhaler, Inhale 1 puff into the lungs every 6 (six) hours as needed., Disp: 18 g, Rfl: 10   Azelastine-Fluticasone 137-50 MCG/ACT SUSP, Place 1 spray into both nostrils 2 (two) times daily., Disp: 23 g, Rfl: 1   diazepam (VALIUM) 5 MG tablet, Take 3 tablets (15 mg total) by mouth 3 (three) times daily., Disp: 270 tablet, Rfl: 0   diltiazem (CARDIZEM CD) 120 MG 24 hr capsule, Take 1 capsule (120 mg total) by mouth daily., Disp: 90 capsule, Rfl: 0   DULoxetine (CYMBALTA) 20 MG capsule, Take 1 capsule (20 mg total) by mouth daily., Disp: 90 capsule, Rfl: 0   fluticasone (  FLONASE) 50 MCG/ACT nasal spray, Place 1 spray into both nostrils daily., Disp: 16 g, Rfl: 0   hydrochlorothiazide (HYDRODIURIL) 12.5 MG tablet, Take 1 tablet (12.5 mg total) by mouth daily., Disp: 90 tablet, Rfl: 0   loratadine (CLARITIN) 10 MG tablet, Take 1 tablet (10 mg total) by mouth 2 (two) times daily., Disp: 60 tablet, Rfl: 11   losartan (COZAAR) 25 MG tablet, Take 1 tablet (25 mg total) by mouth daily., Disp: 90 tablet, Rfl: 0   magnesium oxide (MAG-OX) 400 (240  Mg) MG tablet, Take 1 tablet (400 mg total) by mouth 2 (two) times daily., Disp: 180 tablet, Rfl: 0   Multiple Vitamins-Minerals (CENTRUM SILVER PO), Take 1 tablet by mouth daily., Disp: , Rfl:    nystatin-triamcinolone ointment (MYCOLOG), Apply 1 Application topically 2 (two) times daily. Apply to l groin irritation x 2 weeks, Disp: 60 g, Rfl: 0   prednisoLONE acetate (PRED FORTE) 1 % ophthalmic suspension, Place 1 drop into the right eye 4 (four) times daily., Disp: 5 mL, Rfl: 0   rosuvastatin (CRESTOR) 20 MG tablet, Take 1 tablet (20 mg total) by mouth daily., Disp: 90 tablet, Rfl: 0   tamsulosin (FLOMAX) 0.4 MG CAPS capsule, Take 1 capsule (0.4 mg total) by mouth daily., Disp: 90 capsule, Rfl: 0   fluticasone-salmeterol (ADVAIR) 100-50 MCG/ACT AEPB, Inhale 1 puff into the lungs 2 (two) times daily., Disp: 1 each, Rfl: 3   montelukast (SINGULAIR) 10 MG tablet, Take 1 tablet (10 mg total) by mouth at bedtime., Disp: 30 tablet, Rfl: 11   Subjective:   PATIENT ID: Clarence Hamilton GENDER: male DOB: 12/26/1955, MRN: [id number]  Chief Complaint  Patient presents with   Consult    Shortness of breath due to nasal congestion. Occasional cough.     HPI  Patient is a pleasant 67 year old male presenting to clinic to establish care.  He has a history of asthma and was previously followed by Dr. Meredeth Ide.  He reports wanting to switch care over to our clinic.  Patient reports history of asthma that has been diagnosed for many years now and is maintained on long-acting inhalers with Advair.  He was previously followed by Dr. Meredeth Ide but he does not recall seeing him in clinic.  On review of the medical record, it appears that he has been seen by Dr. Meredeth Ide since 2015 (in Care Everywhere).  He reports not remembering seeing Dr. Meredeth Ide or knowing what the diagnosis he carries is.  Patient reports no current respiratory symptoms. His main complaint is that of nasal congestion. He is currently  maintained on loratadine and intranasal flonase with some improvement. He sometimes uses his loratadine twice daily. Patient also reports history of being on multiple psychotropic medications with a recent admission to the hospital for delirium, felt to be toxic metabolic secondary to overuse of benzodiazepines. Patient was discharged on diazepam in place of alprazolam.  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 Reports working under a hood with potential for exposures that he isn't able to elaborate on.  Ancillary information including prior medications, full medical/surgical/family/social histories, and PFTs (when available) are listed below and have been reviewed.   Review of Systems  Constitutional:  Negative for chills, diaphoresis, fever, malaise/fatigue and weight loss.  Respiratory:  Negative for cough, hemoptysis, sputum production, shortness of breath and wheezing.   Cardiovascular:  Negative for chest pain, palpitations, orthopnea and PND.     Objective:  Vitals:   07/27/23 1549  BP: 110/76  Pulse: 62  Temp: 97.6 F (36.4 C)  TempSrc: Temporal  SpO2: 96%  Weight: 205 lb 6.4 oz (93.2 kg)  Height: 5\' 9"  (1.753 m)   96% on RA  BMI Readings from Last 3 Encounters:  07/27/23 30.33 kg/m  07/12/23 30.13 kg/m  06/29/23 31.09 kg/m   Wt Readings from Last 3 Encounters:  07/27/23 205 lb 6.4 oz (93.2 kg)  07/12/23 204 lb (92.5 kg)  06/29/23 204 lb 8 oz (92.8 kg)    Physical Exam Constitutional:      Appearance: Normal appearance. He is obese.  Cardiovascular:     Rate and Rhythm: Normal rate and regular rhythm.     Pulses: Normal pulses.     Heart sounds: Normal heart sounds.  Pulmonary:     Effort: Pulmonary effort is normal.     Breath sounds: Normal breath sounds. No wheezing or rales.  Abdominal:     Palpations: Abdomen is soft.  Neurological:     General: No focal deficit present.     Mental Status: He is alert  and oriented to person, place, and time. Mental status is at baseline.       Ancillary Information    Past Medical History:  Diagnosis Date   Anxiety    Arthritis    Asthma    History of chicken pox    Hyperglycemia    Hyperlipidemia    Hypertension    Obesity      Family History  Problem Relation Age of Onset   Hypertension Mother    Breast cancer Mother    Melanoma Father    Hypertension Father    Parkinson's disease Father    Throat cancer Paternal Grandmother      Past Surgical History:  Procedure Laterality Date   HERNIA REPAIR  1970's   Inguinal hernia repair x 2    TONSILLECTOMY AND ADENOIDECTOMY  1960's    Social History   Socioeconomic History   Marital status: Single    Spouse name: Not on file   Number of children: 0   Years of education: Not on file   Highest education level: Bachelor's degree (e.g., BA, AB, BS)  Occupational History   Occupation: Retired  Tobacco Use   Smoking status: Never   Smokeless tobacco: Never  Vaping Use   Vaping status: Never Used  Substance and Sexual Activity   Alcohol use: No    Alcohol/week: 0.0 standard drinks of alcohol   Drug use: No   Sexual activity: Not on file  Other Topics Concern   Not on file  Social History Narrative   Mother recently passed and patient has apparently went downhill   Social Determinants of Health   Financial Resource Strain: Low Risk  (07/12/2023)   Overall Financial Resource Strain (CARDIA)    Difficulty of Paying Living Expenses: Not hard at all  Food Insecurity: No Food Insecurity (07/12/2023)   Hunger Vital Sign    Worried About Running Out of Food in the Last Year: Never true    Ran Out of Food in the Last Year: Never true  Transportation Needs: No Transportation Needs (07/12/2023)   PRAPARE - Administrator, Civil Service (Medical): No    Lack of Transportation (Non-Medical): No  Physical Activity: Inactive (07/12/2023)   Exercise Vital Sign    Days of  Exercise per Week: 0 days    Minutes of Exercise per Session: 0 min  Stress: Stress Concern Present (07/12/2023)   [employer] of Occupational Health - Occupational Stress Questionnaire    Feeling of Stress : To some extent  Social Connections: Socially Isolated (07/12/2023)   Social Connection and Isolation Panel [NHANES]    Frequency of Communication with Friends and Family: More than three times a week    Frequency of Social Gatherings with Friends and Family: More than three times a week    Attends Religious Services: Never    Database administrator or Organizations: No    Attends Banker Meetings: Never    Marital Status: Never married  Intimate Partner Violence: Not At Risk (07/12/2023)   Humiliation, Afraid, Rape, and Kick questionnaire    Fear of Current or Ex-Partner: No    Emotionally Abused: No    Physically Abused: No    Sexually Abused: No     Allergies  Allergen Reactions   Mustard Cough    Reports nasal congestion and cough, wanted notation in chart .    Sulfa Antibiotics      CBC    Component Value Date/Time   WBC 12.1 (H) 06/29/2023 1640   WBC 7.7 06/09/2023 0803   RBC 3.56 (L) 06/29/2023 1640   RBC 3.10 (L) 06/09/2023 0803   HGB 10.6 (L) 06/29/2023 1640   HCT 33.5 (L) 06/29/2023 1640   PLT 314 06/29/2023 1640   MCV 94 06/29/2023 1640   MCH 29.8 06/29/2023 1640   MCH 30.0 06/09/2023 0803   MCHC 31.6 06/29/2023 1640   MCHC 33.1 06/09/2023 0803   RDW 13.6 06/29/2023 1640   LYMPHSABS 1.9 06/29/2023 1640   MONOABS 1.0 06/03/2023 0530   EOSABS 0.3 06/29/2023 1640   BASOSABS 0.1 06/29/2023 1640    Pulmonary Functions Testing Results:     No data to display          Outpatient Medications Prior to Visit  Medication Sig Dispense Refill   albuterol (VENTOLIN HFA) 108 (90 Base) MCG/ACT inhaler Inhale 1 puff into the lungs every 6 (six) hours as needed. 18 g 10   Azelastine-Fluticasone 137-50 MCG/ACT SUSP Place 1 spray into both  nostrils 2 (two) times daily. 23 g 1   diazepam (VALIUM) 5 MG tablet Take 3 tablets (15 mg total) by mouth 3 (three) times daily. 270 tablet 0   diltiazem (CARDIZEM CD) 120 MG 24 hr capsule Take 1 capsule (120 mg total) by mouth daily. 90 capsule 0   DULoxetine (CYMBALTA) 20 MG capsule Take 1 capsule (20 mg total) by mouth daily. 90 capsule 0   fluticasone (FLONASE) 50 MCG/ACT nasal spray Place 1 spray into both nostrils daily. 16 g 0   hydrochlorothiazide (HYDRODIURIL) 12.5 MG tablet Take 1 tablet (12.5 mg total) by mouth daily. 90 tablet 0   losartan (COZAAR) 25 MG tablet Take 1 tablet (25 mg total) by mouth daily. 90 tablet 0   magnesium oxide (MAG-OX) 400 (240 Mg) MG tablet Take 1 tablet (400 mg total) by mouth 2 (two) times daily. 180 tablet 0   Multiple Vitamins-Minerals (CENTRUM SILVER PO) Take 1 tablet by mouth daily.     nystatin-triamcinolone ointment (MYCOLOG) Apply 1 Application topically 2 (two) times daily. Apply to l groin irritation x 2 weeks 60 g 0   prednisoLONE acetate (PRED FORTE) 1 % ophthalmic suspension Place 1 drop into the right eye 4 (four) times daily. 5 mL 0   rosuvastatin (CRESTOR) 20 MG tablet Take 1 tablet (20 mg total) by mouth daily.  90 tablet 0   tamsulosin (FLOMAX) 0.4 MG CAPS capsule Take 1 capsule (0.4 mg total) by mouth daily. 90 capsule 0   fluticasone-salmeterol (ADVAIR) 100-50 MCG/ACT AEPB Inhale 1 puff into the lungs 2 (two) times daily. 1 each 3   loratadine (CLARITIN) 10 MG tablet Take 1 tablet (10 mg total) by mouth daily. 100 tablet 0   montelukast (SINGULAIR) 10 MG tablet Take 1 tablet (10 mg total) by mouth at bedtime. 90 tablet 0   No facility-administered medications prior to visit.

## 2023-07-28 ENCOUNTER — Telehealth: Payer: Self-pay

## 2023-07-28 NOTE — Telephone Encounter (Signed)
Copied from CRM [id number]. Topic: General - Other >> Jul 28, 2023  9:31 AM Macon Large wrote: Reason for CRM: Pt's POA, Gorden Harms requests that Dr. Theodis Aguas nurse return his call to discuss the pt appt for tomorrow. Cb#  [phone]

## 2023-07-28 NOTE — Telephone Encounter (Signed)
Called Clarence Hamilton pt's POA.   Mr. Clarence Hamilton reports patient has appointment tomorrow and wants Dr.Fisher to know that patient has had multiples falls with no injuries, inhumane living, house in foreclosure and patient doesn't understand. Reports patient says "wants to die" has some suicidal ideation, refuses to wash clothes, some Dementia, is a Chartered loss adjuster.

## 2023-07-29 ENCOUNTER — Ambulatory Visit (INDEPENDENT_AMBULATORY_CARE_PROVIDER_SITE_OTHER): Payer: Medicare PPO | Admitting: Family Medicine

## 2023-07-29 VITALS — BP 123/82 | HR 77 | Temp 99.1°F | Ht 69.0 in | Wt 203.0 lb

## 2023-07-29 DIAGNOSIS — F419 Anxiety disorder, unspecified: Secondary | ICD-10-CM

## 2023-07-29 DIAGNOSIS — N1832 Chronic kidney disease, stage 3b: Secondary | ICD-10-CM

## 2023-07-29 DIAGNOSIS — I10 Essential (primary) hypertension: Secondary | ICD-10-CM

## 2023-07-29 DIAGNOSIS — M5416 Radiculopathy, lumbar region: Secondary | ICD-10-CM | POA: Diagnosis not present

## 2023-07-29 DIAGNOSIS — F39 Unspecified mood [affective] disorder: Secondary | ICD-10-CM | POA: Diagnosis not present

## 2023-07-29 DIAGNOSIS — F32A Depression, unspecified: Secondary | ICD-10-CM

## 2023-07-29 MED ORDER — LOSARTAN POTASSIUM 25 MG PO TABS: 25.0000 mg | ORAL_TABLET | Freq: Every day | ORAL | 1 refills | Status: AC

## 2023-07-29 MED ORDER — DULOXETINE HCL 20 MG PO CPEP
40.0000 mg | ORAL_CAPSULE | Freq: Every day | ORAL | Status: DC
Start: 2023-07-29 — End: 2023-10-05

## 2023-07-29 NOTE — Progress Notes (Signed)
Established patient visit   Patient: Clarence Hamilton   DOB: [birth year]   67 y.o. Male  MRN: [id number] Visit Date: 07/29/2023  Today's healthcare provider: Mila Merry, MD   Chief Complaint  Patient presents with   Hypertension   Subjective    Discussed the use of AI scribe software for clinical note transcription with the patient, who gave verbal consent to proceed.  History of Present Illness   The patient, with a history of hypertension and 3b chronic kidney disease, presents for a follow-up visit primarily concerning blood pressure management. He reports no recent health issues and has been adhering to his prescribed medication regimen. Was started back on losartan at visit with Merita Norton about a month ago, but only taking 25mg  which he is tolerating well.   The patient has been referred to a nephrologist due to abnormal blood test results. He reports adequate hydration with water and fruit juice and denies the use of over-the-counter pain medications, except for Tylenol. The patient has been experiencing pain due to deteriorating cartilage in both knees, which has been causing difficulty in walking and necessitating the use of a handicapped sign.  The patient also reports lower back pain, particularly when standing or leaning forward, which he manages by resting in a recliner, using a heating pad, and taking Tylenol. He has previously received spinal injections for this issue but has discontinued due to the inconvenience of travel.  The patient has been switched from Xanax to Valium during a recent hospital stay and reports that the current high dose of Valium is effectively managing his anxiety. He expresses concern about reducing this dosage due to the potential resurgence of anxiety symptoms.  The patient has also been started on a low dose of duloxetine for depression, anxiety and pain at his last visit. He reports some persisting anxiety despite the use of Valium. The  patient is facing significant life changes, including reliance on social security, cessation of driving, and dependence on neighbors for yard maintenance and household repairs.       Medications: Outpatient Medications Prior to Visit  Medication Sig   albuterol (VENTOLIN HFA) 108 (90 Base) MCG/ACT inhaler Inhale 1 puff into the lungs every 6 (six) hours as needed.   Azelastine-Fluticasone 137-50 MCG/ACT SUSP Place 1 spray into both nostrils 2 (two) times daily.   diazepam (VALIUM) 5 MG tablet Take 3 tablets (15 mg total) by mouth 3 (three) times daily.   diltiazem (CARDIZEM CD) 120 MG 24 hr capsule Take 1 capsule (120 mg total) by mouth daily.   fluticasone (FLONASE) 50 MCG/ACT nasal spray Place 1 spray into both nostrils daily.   fluticasone-salmeterol (ADVAIR) 100-50 MCG/ACT AEPB Inhale 1 puff into the lungs 2 (two) times daily.   hydrochlorothiazide (HYDRODIURIL) 12.5 MG tablet Take 1 tablet (12.5 mg total) by mouth daily.   loratadine (CLARITIN) 10 MG tablet Take 1 tablet (10 mg total) by mouth 2 (two) times daily.   magnesium oxide (MAG-OX) 400 (240 Mg) MG tablet Take 1 tablet (400 mg total) by mouth 2 (two) times daily.   montelukast (SINGULAIR) 10 MG tablet Take 1 tablet (10 mg total) by mouth at bedtime.   Multiple Vitamins-Minerals (CENTRUM SILVER PO) Take 1 tablet by mouth daily.   nystatin-triamcinolone ointment (MYCOLOG) Apply 1 Application topically 2 (two) times daily. Apply to l groin irritation x 2 weeks   prednisoLONE acetate (PRED FORTE) 1 % ophthalmic suspension Place 1 drop into the right eye  4 (four) times daily.   rosuvastatin (CRESTOR) 20 MG tablet Take 1 tablet (20 mg total) by mouth daily.   tamsulosin (FLOMAX) 0.4 MG CAPS capsule Take 1 capsule (0.4 mg total) by mouth daily.   [DISCONTINUED] DULoxetine (CYMBALTA) 20 MG capsule Take 1 capsule (20 mg total) by mouth daily.   [DISCONTINUED] losartan (COZAAR) 25 MG tablet Take 1 tablet (25 mg total) by mouth daily.   No  facility-administered medications prior to visit.   Review of Systems     Objective    BP 123/82 (BP Location: Right Arm, Patient Position: Sitting, Cuff Size: Normal)   Pulse 77   Temp 99.1 F (37.3 C) (Oral)   Ht 5\' 9"  (1.753 m)   Wt 203 lb (92.1 kg)   SpO2 99%   BMI 29.98 kg/m   Physical Exam  General appearance: Well developed, well nourished male, cooperative and in no acute distress Head: Normocephalic, without obvious abnormality, atraumatic Respiratory: Respirations even and unlabored, normal respiratory rate Extremities: All extremities are intact.  Skin: Skin color, texture, turgor normal. No rashes seen  Psych: Appropriate mood and affect. Neurologic: Mental status: Alert, oriented to person, place, and time, thought content appropriate.   Assessment & Plan        Hypertension Well controlled on Losartan 25mg  daily. -Refill Losartan 25mg  daily for 90 days.  Chronic Kidney Disease Pending nephrology consultation. -Continue Losartan 25mg  daily. -Encouraged to maintain adequate hydration and avoid NSAIDs  Chronic Pain (Knees and Lower Spine) Currently managed with Tylenol. -Double the dose of Duloxetine to 40mg  daily (two 20mg  pills) for potential additional pain relief.  Anxiety Managed with Valium, patient reports occasional breakthrough anxiety. -Continue current dose of Valium. -Consider potential for Duloxetine dose increase to assist with anxiety management.  -Complete and submit application for handicap placard. -Follow up in 6 weeks to assess efficacy of increased Duloxetine dose.    Return in about 6 weeks (around 09/09/2023).      Mila Merry, MD  [facility] [phone] (phone) [phone] (fax)  [facility]

## 2023-07-29 NOTE — Patient Instructions (Signed)
Please review the attached list of medications and notify my office if there are any errors.   Increase duloxetine to 2 of the 20mg  tablet once every day

## 2023-08-02 ENCOUNTER — Ambulatory Visit: Payer: Self-pay | Admitting: *Deleted

## 2023-08-02 NOTE — Patient Outreach (Signed)
Care Coordination   Follow Up Visit Note   08/02/2023 Name: Sevin Pacha MRN: [id number] DOB: 12/18/1955  Alwyn Ren Ferrebee is a 67 y.o. year old male who sees Fisher, Demetrios Isaacs, MD for primary care. I spoke with  Alwyn Ren [place] by phone today.  What matters to the patients health and wellness today?  Patient continues to work on the clearing of his home with the assistance of friends and family. Patient denies having nay mental health concerns at this time, per patient anxiety is managed by medication, patient able to confirm use of positive strategies.    Goals Addressed             This Visit's Progress    Care coordination activities       Interventions Today    Flowsheet Row Most Recent Value  Chronic Disease   Chronic disease during today's visit Hypertension (HTN)  General Interventions   General Interventions Discussed/Reviewed General Interventions Reviewed, [facility], Doctor Visits  [patient continues with the clearing of his home-APS no longer involved,neighbors assisting with clean up and transportation to medical appointments-POA continues to assist with hardship application for mortgage payment assistance]  Doctor Visits Discussed/Reviewed Doctor Visits Reviewed, PCP  [07/29/23]  Mental Health Interventions   Mental Health Discussed/Reviewed Mental Health Reviewed, Coping Strategies, Depression  [spurts of anxiety, no panic attacks since last call-very short spells, per pt-stopped overthinking things, working on prioritizing tasks to avoid feeling overwhelmed-pos reinforcement provided for postive coping stategies utilized PCP manages medications]              SDOH assessments and interventions completed:  No     Care Coordination Interventions:  Yes, provided   Follow up plan: No further intervention required.   Encounter Outcome:  Patient Visit Completed

## 2023-08-02 NOTE — Patient Instructions (Signed)
Visit Information  Thank you for taking time to visit with me today. Please don't hesitate to contact me if I can be of assistance to you.   Following are the goals we discussed today:  Please continue to pace yourself as you continue efforts to clear your home Please continue to reach our to your provider with any questions or concerns regarding your medical care Please continue to work with your family(POA) related to available resources to assist with any financial challenges   If you are experiencing a Mental Health or Behavioral Health Crisis or need someone to talk to, please call the Suicide and Crisis Lifeline: 988   Patient verbalizes understanding of instructions and care plan provided today and agrees to view in MyChart. Active MyChart status and patient understanding of how to access instructions and care plan via MyChart confirmed with patient.     No further follow up required: please contact this Child psychotherapist with any additional community resource needs  [employer], [employer]  [facility], [facility] Licensed Clinical Social Geologist, engineering Dial: [phone]

## 2023-08-09 ENCOUNTER — Ambulatory Visit: Payer: Self-pay | Admitting: *Deleted

## 2023-08-09 ENCOUNTER — Other Ambulatory Visit: Payer: Self-pay | Admitting: Family Medicine

## 2023-08-09 NOTE — Patient Outreach (Addendum)
  Care Coordination   Follow Up Visit Note   09/14/2023 Name: Ewan Cressey MRN: [id number] DOB: May 26, 1956  Alwyn Ren Monaco is a 67 y.o. year old male who sees Fisher, Demetrios Isaacs, MD for primary care. I spoke with  Alwyn Ren Jewkes by phone today.  What matters to the patients health and wellness today?  Patient recently active with CSW, C. Land, working on Primary school teacher.  State he is doing much better now, denies having any issues with management of his health right now, compliant with all provider visits, compliant with medication, cousin and neighbor supportive and provides transportation for appts.     Goals Addressed             This Visit's Progress    Effective management of chronic conditions       Interventions Today    Flowsheet Row Most Recent Value  Chronic Disease   Chronic disease during today's visit Hypertension (HTN)  General Interventions   General Interventions Discussed/Reviewed General Interventions Reviewed, Doctor Visits  Doctor Visits Discussed/Reviewed Doctor Visits Reviewed, PCP, Specialist  [nephrology 10/23, urology 11/6, PCP 11/15]  PCP/Specialist Visits Compliance with follow-up visit  Education Interventions   Education Provided Provided Education  Provided Verbal Education On Medication, When to see the doctor  [Taking increased dose of Duloxetine, state working well]  Mental Health Interventions   Mental Health Discussed/Reviewed Anxiety              SDOH assessments and interventions completed:  No     Care Coordination Interventions:  Yes, provided   Follow up plan: Follow up call scheduled for 11/20    Encounter Outcome:  Patient Visit Completed   Kemper Durie RN, MSN, CCM [place]  [facility], [facility] RN Care Coordinator Direct Dial: [phone] / Main [phone] Fax [phone] Email: [email or web address] Website: [email or web address]

## 2023-08-09 NOTE — Telephone Encounter (Signed)
Medication Refill - Medication: diazepam (VALIUM) 5 MG tablet   Has the patient contacted their pharmacy? Yes.   (Agent: If no, request that the patient contact the pharmacy for the refill. If patient does not wish to contact the pharmacy document the reason why and proceed with request.) (Agent: If yes, when and what did the pharmacy advise?)  Preferred Pharmacy (with phone number or street name):  [id number] - [place], [place] - [address] AT [facility] [address]  [address] [place] [address]  Phone: [phone] Fax: [phone]   Has the patient been seen for an appointment in the last year OR does the patient have an upcoming appointment? Yes.    Agent: Please be advised that RX refills may take up to 3 business days. We ask that you follow-up with your pharmacy.

## 2023-08-10 NOTE — Telephone Encounter (Signed)
Requested medication (s) are due for refill today: Due 08/26/23  Requested medication (s) are on the active medication list: yes  Last refill:  07/12/23  #270 0 refills    Future visit scheduled yes 09/09/23  Notes to clinic:  Not delegated please review. Thank you.  Requested Prescriptions  Pending Prescriptions Disp Refills   diazepam (VALIUM) 5 MG tablet 270 tablet 0    Sig: Take 3 tablets (15 mg total) by mouth 3 (three) times daily.     Not Delegated - Psychiatry: Anxiolytics/Hypnotics 2 Failed - 08/09/2023  3:44 PM      Failed - This refill cannot be delegated      Passed - Urine Drug Screen completed in last 360 days      Passed - Patient is not pregnant      Passed - Valid encounter within last 6 months    Recent Outpatient Visits           1 week ago Primary hypertension   [place] [facility] Malva Limes, MD   1 month ago Mood disorder [facility])   [place] [facility] Jacky Kindle, FNP   1 year ago Annual physical exam   [facility] Malva Limes, MD   1 year ago Primary hypertension   [facility] Malva Limes, MD   2 years ago Hyperglycemia   [facility] Malva Limes, MD       Future Appointments             In 3 weeks Stoioff, Verna Czech, MD [facility] [place]   In 1 month Fisher, Demetrios Isaacs, MD [facility], [facility]

## 2023-08-11 MED ORDER — DIAZEPAM 5 MG PO TABS: 15.0000 mg | ORAL_TABLET | Freq: Three times a day (TID) | ORAL | 0 refills | Status: AC

## 2023-08-18 ENCOUNTER — Other Ambulatory Visit: Payer: Self-pay | Admitting: Family Medicine

## 2023-08-19 NOTE — Telephone Encounter (Signed)
Unable to refill per protocol, Rx expired. Discontinued 06/14/23.  Requested Prescriptions  Pending Prescriptions Disp Refills   TIADYLT ER 120 MG 24 hr capsule [Pharmacy Med Name: TIADYLT ER 120MG  CAPSULES (24 HR)] 90 capsule 0    Sig: TAKE 1 CAPSULE(120 MG) BY MOUTH DAILY     Cardiovascular: Calcium Channel Blockers 3 Failed - 08/18/2023 12:17 PM      Failed - ALT in normal range and within 360 days    ALT  Date Value Ref Range Status  06/29/2023 63 (H) 0 - 44 IU/L Final         Failed - Cr in normal range and within 360 days    Creat  Date Value Ref Range Status  10/13/2017 1.05 0.70 - 1.25 mg/dL Final    Comment:    For patients >44 years of age, the reference limit for Creatinine is approximately 13% higher for people identified as African-American. .    Creatinine, Ser  Date Value Ref Range Status  06/29/2023 1.69 (H) 0.76 - 1.27 mg/dL Final         Passed - AST in normal range and within 360 days    AST  Date Value Ref Range Status  06/29/2023 36 0 - 40 IU/L Final         Passed - Last BP in normal range    BP Readings from Last 1 Encounters:  07/29/23 123/82         Passed - Last Heart Rate in normal range    Pulse Readings from Last 1 Encounters:  07/29/23 77         Passed - Valid encounter within last 6 months    Recent Outpatient Visits           3 weeks ago Primary hypertension   [place] [facility] Malva Limes, MD   1 month ago Mood disorder [facility])   [place] [facility] Jacky Kindle, FNP   1 year ago Annual physical exam   [facility] Malva Limes, MD   1 year ago Primary hypertension   [place] [facility] Malva Limes, MD   2 years ago Hyperglycemia   [facility] Malva Limes, MD       Future Appointments             In 1 week Stoioff, Verna Czech, MD [facility]rology [place]   In 3 weeks Fisher, Demetrios Isaacs,  MD [facility]

## 2023-08-24 ENCOUNTER — Other Ambulatory Visit: Payer: Self-pay | Admitting: Student in an Organized Health Care Education/Training Program

## 2023-08-24 DIAGNOSIS — J4530 Mild persistent asthma, uncomplicated: Secondary | ICD-10-CM

## 2023-08-31 ENCOUNTER — Ambulatory Visit: Payer: Medicare HMO | Admitting: Urology

## 2023-09-09 ENCOUNTER — Ambulatory Visit: Payer: Medicare PPO | Admitting: Family Medicine

## 2023-09-12 ENCOUNTER — Telehealth: Payer: Self-pay | Admitting: Family Medicine

## 2023-09-12 NOTE — Telephone Encounter (Signed)
Followed up with patient from no show appt..Patient stated that he would reschedule once he gets some personal things straighten out.

## 2023-09-14 ENCOUNTER — Ambulatory Visit: Payer: Self-pay | Admitting: *Deleted

## 2023-09-14 NOTE — Patient Outreach (Addendum)
  Care Coordination   09/14/2023 Name: Clarence Hamilton MRN: [id number] DOB: 11-17-1955   Care Coordination Outreach Attempts:  An unsuccessful telephone outreach was attempted for a scheduled appointment today. Attempted outreach twice.   Follow Up Plan:  Additional outreach attempts will be made to offer the patient care coordination information and services.   Encounter Outcome:  No Answer   Care Coordination Interventions:  No, not indicated    Rodney Langton, RN, MSN, CCM [place]  [facility], [facility] RN Care Coordinator Direct Dial: [phone] / Main [phone] Fax [phone] Email: [email or web address] Website: [email or web address]

## 2023-09-26 ENCOUNTER — Other Ambulatory Visit: Payer: Self-pay

## 2023-10-05 ENCOUNTER — Other Ambulatory Visit: Payer: Self-pay | Admitting: Family Medicine

## 2023-10-05 DIAGNOSIS — F419 Anxiety disorder, unspecified: Secondary | ICD-10-CM

## 2023-10-05 NOTE — Telephone Encounter (Signed)
Please advise 

## 2023-10-14 ENCOUNTER — Ambulatory Visit: Payer: Self-pay | Admitting: *Deleted

## 2023-10-14 NOTE — Patient Outreach (Signed)
  Care Coordination   10/14/2023 Name: Damerius Werthmann MRN: [id number] DOB: Dec 12, 1955   Care Coordination Outreach Attempts:  An unsuccessful outreach was attempted for an appointment today.  Follow Up Plan:  Additional outreach attempts will be made to offer the patient complex care management information and services.   Encounter Outcome:  No Answer   Care Coordination Interventions:  No, not indicated    Rodney Langton, RN, MSN, CCM [place]  [facility], [facility] RN Care Coordinator Direct Dial: [phone] / Main [phone] Fax [phone] Email: [email or web address] Website: [email or web address]

## 2023-11-02 ENCOUNTER — Telehealth: Payer: Self-pay

## 2023-11-02 NOTE — Patient Outreach (Signed)
  Care Management   Outreach Note  11/02/2023 Name: Clarence Hamilton MRN: [id number] DOB: 1956/07/08  An unsuccessful telephone outreach was attempted today to contact the patient about Care Management needs.     Follow Up Plan:  Unable to leave a voice message due to patient's voice mailbox not being set up. A member of the care team will make additional attempts.  [facility]  [facility], [facility] RN Care Manager Direct Dial: [phone] Website: [email or web address]

## 2023-11-08 ENCOUNTER — Telehealth: Payer: Self-pay | Admitting: *Deleted

## 2023-11-08 NOTE — Progress Notes (Signed)
 Complex Care Management Care Guide Note  11/08/2023 Name: Clarence Hamilton MRN: [id number] DOB: [birth year]  Clarence Hamilton is a 68 y.o. year old male who is a primary care patient of Gasper, Nancyann BRAVO, MD and is actively engaged with the care management team. I reached out to Clarence Hamilton by phone today to assist with re-scheduling  with the RN Case Manager.  Follow up plan: Unsuccessful telephone outreach attempt made. A HIPAA compliant phone message was left for the patient providing contact information and requesting a return call.  Clarence Hamilton, CCMA Care Coordination Care Guide Direct Dial: [phone]

## 2023-11-10 NOTE — Progress Notes (Signed)
Complex Care Management Care Guide Note  11/10/2023 Name: Clarence Hamilton MRN: [id number] DOB: [birth year]  Clarence Hamilton is a 68 y.o. year old male who is a primary care patient of Sherrie Mustache, Demetrios Isaacs, MD and is actively engaged with the care management team. I reached out to Clarence Ren Derner by phone today to assist with re-scheduling  with the RN Case Manager.  Follow up plan: unsuccessful telephone outreach attempt made. A HIPAA compliant message was left for the patient providing contact information and requesting a return call. No additional outreach attempts will be made due to inability to maintain patient contact.   Burman Nieves, CMA, Care Guide [facility]  [facility], [facility] Guide Direct Dial: [phone]  Fax: [phone] Website: [email or web address]

## 2024-01-20 ENCOUNTER — Telehealth: Payer: Self-pay | Admitting: Family Medicine

## 2024-01-20 MED ORDER — TAMSULOSIN HCL 0.4 MG PO CAPS: ORAL_CAPSULE | ORAL | 0 refills | Status: AC

## 2024-01-20 NOTE — Telephone Encounter (Signed)
 According to last office visit Dr.Fisher put "Follow up in 6 weeks to assess efficacy of increased Duloxetine dose.  Return in about 6 weeks (around 09/09/2023)." Patient needs to schedule a follow-up appointment. Only sending refill request on the Flomax.

## 2024-01-20 NOTE — Telephone Encounter (Signed)
 [facility] for the following medications:   DULoxetine (CYMBALTA) 20 MG capsule    tamsulosin (FLOMAX) 0.4 MG CAPS capsule    Please advise.

## 2024-01-23 DIAGNOSIS — J449 Chronic obstructive pulmonary disease, unspecified: Secondary | ICD-10-CM | POA: Diagnosis not present

## 2024-02-03 ENCOUNTER — Telehealth: Payer: Self-pay

## 2024-02-03 NOTE — Telephone Encounter (Signed)
 Copied from CRM [id number]. Topic: General - Other >> Feb 03, 2024 11:14 AM Nyra Capes wrote: Reason for CRM: Darel Hong with [employer] [phone] calling in regarding a form that need to be signed and dated by Dr. Sherrie Mustache for [employer] this form was faxed on 01/19/27  and will refax today. Please fax back to [phone]

## 2024-02-06 DIAGNOSIS — J45909 Unspecified asthma, uncomplicated: Secondary | ICD-10-CM | POA: Diagnosis not present

## 2024-02-06 DIAGNOSIS — Z9109 Other allergy status, other than to drugs and biological substances: Secondary | ICD-10-CM | POA: Diagnosis not present

## 2024-02-06 DIAGNOSIS — N183 Chronic kidney disease, stage 3 unspecified: Secondary | ICD-10-CM | POA: Diagnosis not present

## 2024-02-06 DIAGNOSIS — I130 Hypertensive heart and chronic kidney disease with heart failure and stage 1 through stage 4 chronic kidney disease, or unspecified chronic kidney disease: Secondary | ICD-10-CM | POA: Diagnosis not present

## 2024-02-06 DIAGNOSIS — Z882 Allergy status to sulfonamides status: Secondary | ICD-10-CM | POA: Diagnosis not present

## 2024-02-06 DIAGNOSIS — R0600 Dyspnea, unspecified: Secondary | ICD-10-CM | POA: Diagnosis not present

## 2024-02-06 DIAGNOSIS — M1711 Unilateral primary osteoarthritis, right knee: Secondary | ICD-10-CM | POA: Diagnosis not present

## 2024-02-06 DIAGNOSIS — E785 Hyperlipidemia, unspecified: Secondary | ICD-10-CM | POA: Diagnosis not present

## 2024-02-06 DIAGNOSIS — Z888 Allergy status to other drugs, medicaments and biological substances status: Secondary | ICD-10-CM | POA: Diagnosis not present

## 2024-02-06 DIAGNOSIS — M25561 Pain in right knee: Secondary | ICD-10-CM | POA: Diagnosis not present

## 2024-02-06 DIAGNOSIS — M25562 Pain in left knee: Secondary | ICD-10-CM | POA: Diagnosis not present

## 2024-02-06 DIAGNOSIS — G8929 Other chronic pain: Secondary | ICD-10-CM | POA: Diagnosis not present

## 2024-02-06 DIAGNOSIS — Z881 Allergy status to other antibiotic agents status: Secondary | ICD-10-CM | POA: Diagnosis not present

## 2024-02-06 DIAGNOSIS — I509 Heart failure, unspecified: Secondary | ICD-10-CM | POA: Diagnosis not present

## 2024-02-07 DIAGNOSIS — M25561 Pain in right knee: Secondary | ICD-10-CM | POA: Diagnosis not present

## 2024-02-07 DIAGNOSIS — M25562 Pain in left knee: Secondary | ICD-10-CM | POA: Diagnosis not present

## 2024-02-07 DIAGNOSIS — G8929 Other chronic pain: Secondary | ICD-10-CM | POA: Diagnosis not present

## 2024-02-08 NOTE — Telephone Encounter (Signed)
 I still haven't seen this. Maybe they sent to his pulmonologist.

## 2024-02-09 NOTE — Telephone Encounter (Signed)
 Spoke with [employer] and they are faxing again. Fax number was verified.

## 2024-02-22 DIAGNOSIS — J449 Chronic obstructive pulmonary disease, unspecified: Secondary | ICD-10-CM | POA: Diagnosis not present

## 2024-03-24 DIAGNOSIS — J449 Chronic obstructive pulmonary disease, unspecified: Secondary | ICD-10-CM | POA: Diagnosis not present

## 2024-04-11 DIAGNOSIS — H2513 Age-related nuclear cataract, bilateral: Secondary | ICD-10-CM | POA: Diagnosis not present

## 2024-07-17 ENCOUNTER — Ambulatory Visit: Payer: Self-pay

## 2024-08-23 ENCOUNTER — Telehealth: Payer: Self-pay

## 2024-08-23 NOTE — Telephone Encounter (Signed)
 Patient is overdue for appt with PCP. Called patient-no answer, no VM.

## 2024-09-12 ENCOUNTER — Telehealth: Payer: Self-pay

## 2024-09-12 NOTE — Telephone Encounter (Signed)
 Patient has not been seen over a year. They are on the list from [employer] that they need an appt. I have called them and am unable to leave them a message. If they call back please get them scheduled to see their PCP. If they are no longer a pt please tell them to let [employer] know and remove the current PCP.
